# Patient Record
Sex: Female | Born: 1935 | Race: White | Hispanic: No | Marital: Married | Smoking: Former smoker
Health system: Southern US, Community
[De-identification: ages and names within clinical notes are randomized; demographics above are authoritative.]

## PROBLEM LIST (undated history)

## (undated) DIAGNOSIS — R079 Chest pain, unspecified: Secondary | ICD-10-CM

## (undated) DIAGNOSIS — Z87891 Personal history of nicotine dependence: Secondary | ICD-10-CM

## (undated) DIAGNOSIS — R413 Other amnesia: Secondary | ICD-10-CM

## (undated) DIAGNOSIS — H269 Unspecified cataract: Secondary | ICD-10-CM

## (undated) DIAGNOSIS — F419 Anxiety disorder, unspecified: Secondary | ICD-10-CM

## (undated) DIAGNOSIS — I10 Essential (primary) hypertension: Secondary | ICD-10-CM

## (undated) DIAGNOSIS — C801 Malignant (primary) neoplasm, unspecified: Secondary | ICD-10-CM

## (undated) HISTORY — PX: BREAST BIOPSY: SHX20

## (undated) HISTORY — DX: Personal history of nicotine dependence: Z87.891

## (undated) HISTORY — DX: Unspecified cataract: H26.9

## (undated) HISTORY — PX: CHOLECYSTECTOMY: SHX55

## (undated) HISTORY — DX: Anxiety disorder, unspecified: F41.9

## (undated) HISTORY — DX: Other amnesia: R41.3

## (undated) HISTORY — DX: Chest pain, unspecified: R07.9

## (undated) HISTORY — DX: Essential (primary) hypertension: I10

## (undated) HISTORY — PX: MELANOMA EXCISION: SHX5266

## (undated) HISTORY — DX: Malignant (primary) neoplasm, unspecified: C80.1

---

## 1998-12-27 ENCOUNTER — Encounter: Admission: RE | Admit: 1998-12-27 | Discharge: 1998-12-27 | Payer: Self-pay | Admitting: Family Medicine

## 1998-12-27 ENCOUNTER — Encounter: Payer: Self-pay | Admitting: Family Medicine

## 1999-01-01 ENCOUNTER — Other Ambulatory Visit: Admission: RE | Admit: 1999-01-01 | Discharge: 1999-01-01 | Payer: Self-pay | Admitting: Family Medicine

## 1999-01-09 ENCOUNTER — Encounter: Payer: Self-pay | Admitting: Family Medicine

## 1999-01-09 ENCOUNTER — Encounter: Admission: RE | Admit: 1999-01-09 | Discharge: 1999-01-09 | Payer: Self-pay | Admitting: Family Medicine

## 2000-04-02 ENCOUNTER — Encounter: Payer: Self-pay | Admitting: Family Medicine

## 2000-04-02 ENCOUNTER — Encounter: Admission: RE | Admit: 2000-04-02 | Discharge: 2000-04-02 | Payer: Self-pay | Admitting: Family Medicine

## 2000-05-05 ENCOUNTER — Encounter: Payer: Self-pay | Admitting: Family Medicine

## 2000-05-05 ENCOUNTER — Encounter: Admission: RE | Admit: 2000-05-05 | Discharge: 2000-05-05 | Payer: Self-pay | Admitting: *Deleted

## 2000-10-07 ENCOUNTER — Other Ambulatory Visit: Admission: RE | Admit: 2000-10-07 | Discharge: 2000-10-07 | Payer: Self-pay | Admitting: Obstetrics and Gynecology

## 2000-11-10 ENCOUNTER — Encounter: Payer: Self-pay | Admitting: Family Medicine

## 2000-11-10 ENCOUNTER — Ambulatory Visit (HOSPITAL_COMMUNITY): Admission: RE | Admit: 2000-11-10 | Discharge: 2000-11-10 | Payer: Self-pay | Admitting: Family Medicine

## 2001-08-04 ENCOUNTER — Encounter: Payer: Self-pay | Admitting: Family Medicine

## 2001-08-04 ENCOUNTER — Encounter: Admission: RE | Admit: 2001-08-04 | Discharge: 2001-08-04 | Payer: Self-pay | Admitting: Family Medicine

## 2002-11-15 ENCOUNTER — Encounter: Admission: RE | Admit: 2002-11-15 | Discharge: 2002-11-15 | Payer: Self-pay | Admitting: Family Medicine

## 2002-11-15 ENCOUNTER — Encounter: Payer: Self-pay | Admitting: Family Medicine

## 2002-12-23 ENCOUNTER — Encounter: Payer: Self-pay | Admitting: Gastroenterology

## 2003-02-02 ENCOUNTER — Ambulatory Visit (HOSPITAL_COMMUNITY): Admission: RE | Admit: 2003-02-02 | Discharge: 2003-02-02 | Payer: Self-pay | Admitting: Gastroenterology

## 2003-08-07 ENCOUNTER — Ambulatory Visit (HOSPITAL_COMMUNITY): Admission: RE | Admit: 2003-08-07 | Discharge: 2003-08-07 | Payer: Self-pay | Admitting: Family Medicine

## 2004-02-07 ENCOUNTER — Ambulatory Visit (HOSPITAL_COMMUNITY): Admission: RE | Admit: 2004-02-07 | Discharge: 2004-02-07 | Payer: Self-pay | Admitting: Family Medicine

## 2004-04-04 ENCOUNTER — Ambulatory Visit: Payer: Self-pay | Admitting: Gastroenterology

## 2004-07-24 ENCOUNTER — Encounter: Admission: RE | Admit: 2004-07-24 | Discharge: 2004-07-24 | Payer: Self-pay | Admitting: Internal Medicine

## 2004-09-04 ENCOUNTER — Encounter: Admission: RE | Admit: 2004-09-04 | Discharge: 2004-09-04 | Payer: Self-pay | Admitting: Family Medicine

## 2005-01-15 ENCOUNTER — Ambulatory Visit: Payer: Self-pay | Admitting: Gastroenterology

## 2005-03-03 ENCOUNTER — Ambulatory Visit: Payer: Self-pay | Admitting: Gastroenterology

## 2006-03-19 ENCOUNTER — Encounter: Admission: RE | Admit: 2006-03-19 | Discharge: 2006-03-19 | Payer: Self-pay | Admitting: Internal Medicine

## 2007-07-08 ENCOUNTER — Encounter: Admission: RE | Admit: 2007-07-08 | Discharge: 2007-07-08 | Payer: Self-pay | Admitting: Internal Medicine

## 2007-11-05 ENCOUNTER — Encounter: Admission: RE | Admit: 2007-11-05 | Discharge: 2007-11-05 | Payer: Self-pay | Admitting: Internal Medicine

## 2008-11-09 ENCOUNTER — Encounter: Admission: RE | Admit: 2008-11-09 | Discharge: 2008-11-09 | Payer: Self-pay | Admitting: Internal Medicine

## 2008-12-19 DIAGNOSIS — F329 Major depressive disorder, single episode, unspecified: Secondary | ICD-10-CM | POA: Insufficient documentation

## 2008-12-19 DIAGNOSIS — M1990 Unspecified osteoarthritis, unspecified site: Secondary | ICD-10-CM | POA: Insufficient documentation

## 2008-12-19 DIAGNOSIS — G4700 Insomnia, unspecified: Secondary | ICD-10-CM | POA: Insufficient documentation

## 2008-12-19 DIAGNOSIS — E785 Hyperlipidemia, unspecified: Secondary | ICD-10-CM | POA: Insufficient documentation

## 2008-12-19 DIAGNOSIS — C439 Malignant melanoma of skin, unspecified: Secondary | ICD-10-CM | POA: Insufficient documentation

## 2008-12-19 DIAGNOSIS — K449 Diaphragmatic hernia without obstruction or gangrene: Secondary | ICD-10-CM | POA: Insufficient documentation

## 2008-12-20 ENCOUNTER — Encounter: Payer: Self-pay | Admitting: Gastroenterology

## 2008-12-27 ENCOUNTER — Encounter (INDEPENDENT_AMBULATORY_CARE_PROVIDER_SITE_OTHER): Payer: Self-pay | Admitting: *Deleted

## 2008-12-27 DIAGNOSIS — G629 Polyneuropathy, unspecified: Secondary | ICD-10-CM | POA: Insufficient documentation

## 2009-01-25 ENCOUNTER — Ambulatory Visit: Payer: Self-pay | Admitting: Gastroenterology

## 2009-01-25 DIAGNOSIS — R079 Chest pain, unspecified: Secondary | ICD-10-CM | POA: Insufficient documentation

## 2009-01-25 DIAGNOSIS — R1310 Dysphagia, unspecified: Secondary | ICD-10-CM | POA: Insufficient documentation

## 2009-06-26 DIAGNOSIS — L309 Dermatitis, unspecified: Secondary | ICD-10-CM | POA: Insufficient documentation

## 2010-02-24 HISTORY — PX: BREAST BIOPSY: SHX20

## 2010-03-18 ENCOUNTER — Encounter: Payer: Self-pay | Admitting: Internal Medicine

## 2010-03-28 NOTE — Procedures (Signed)
Summary: colonoscopy   Colonoscopy  Procedure date:  03/03/2005  Findings:      Results: Diverticulosis.       Location:  [facility].    Comments:      Repeat colonoscopy in 10 years.   Patient Name: Kristi Montgomery, Kristi Montgomery MRN:  Procedure Procedures: Colorectal cancer screening, average risk CPT: G0121.  Personnel: Endoscopist: Barbette Hair. Arlyce Dice, MD.  Patient Consent: Procedure, Alternatives, Risks and Benefits discussed, consent obtained, from patient.  Indications Symptoms: Abdominal pain / bloating.  History  Current Medications: Patient is not currently taking Coumadin.  Pre-Exam Physical: Performed Mar 03, 2005. Cardio-pulmonary exam, HEENT exam , Abdominal exam, Mental status exam WNL.  Comments: Patient history reviewed/updated, physical performed prior to initiation of sedation? Exam Exam: Extent of exam reached: Cecum, extent intended: Cecum.  The cecum was identified by IC valve. Colon retroflexion performed. ASA Classification: II. Tolerance: good.  Monitoring: Pulse and BP monitoring, Oximetry used. Supplemental O2 given. at 2 Liters.  Colon Prep Used Miralax for colon prep. Prep results: good.  Sedation Meds: Patient assessed and found to be appropriate for moderate (conscious) sedation. Sedation was managed by the Endoscopist. Fentanyl 75 mcg. given IV. Versed 9 mg. given IV.  Findings - DIVERTICULOSIS: Descending Colon to Sigmoid Colon. ICD9: Diverticulosis: 562.10. Comments: Scattered diverticula.  - NORMAL EXAM: Cecum to Descending Colon.  NORMAL EXAM: Cecum.  NORMAL EXAM: Rectum.   Assessment Abnormal examination, see findings above.  Diagnoses: 562.10: Diverticulosis.   Events  Unplanned Interventions: No intervention was required.  Unplanned Events: There were no complications. Plans  Post Exam Instructions: Post sedation instructions given.  Patient Education: Patient given standard instructions for:  Diverticulosis.  Disposition: After procedure patient sent to recovery. After recovery patient sent home.  Scheduling/Referral: Colonoscopy, to Barbette Hair. Arlyce Dice, MD, around Mar 04, 2015.    This report was created from the original endoscopy report, which was reviewed and signed by the above listed endoscopist.    cc. Ellouise Newer

## 2010-03-28 NOTE — Assessment & Plan Note (Signed)
Summary: increased dyspepsia and dysphagia...as.   History of Present Illness Visit Type: new patient  Primary GI MD: Melvia Heaps MD [facility] Primary Provider: Creola Corn, MD  Requesting Provider: n/a Chief Complaint: dysphagia, acid reflux, belching, and bloating  History of Present Illness:   Kristi Montgomery is a pleasant 77 year old white female referred at the request of Dr. Timothy Lasso for evaluation of chest discomfort.  She has been complaining of midline chest pain when she lifts or bends her arms.  Also be elicited by raising her arms.  it is nonexertional.  About a month ago she had a dysphagia.  This has subsided.  She believes that her dysphagia worsens with anxiety.  She has a history of an esophageal stricture which was dilated 3 years ago   GI Review of Systems    Reports acid reflux, belching, bloating, chest pain, dysphagia with solids, heartburn, nausea, and  vomiting.      Denies abdominal pain, dysphagia with liquids, loss of appetite, vomiting blood, weight loss, and  weight gain.      Reports diarrhea.     Denies anal fissure, black tarry stools, change in bowel habit, constipation, diverticulosis, fecal incontinence, heme positive stool, hemorrhoids, irritable bowel syndrome, jaundice, light color stool, liver problems, rectal bleeding, and  rectal pain.    Current Medications (verified): 1)  Verapamil Hcl Cr 180 Mg Cr-Tabs (Verapamil Hcl) .Marland Kitchen.. 1 By Mouth Once Daily 2)  Hydrochlorothiazide 25 Mg Tabs (Hydrochlorothiazide) .Marland Kitchen.. 1 By Mouth Once Daily 3)  Prilosec Otc 20 Mg Tbec (Omeprazole Magnesium) .Marland Kitchen.. 1 By Mouth Once Daily 4)  Neurontin 100 Mg Caps (Gabapentin) .... One Tablet By Mouth Once Daily 5)  Ambien 10 Mg Tabs (Zolpidem Tartrate) .... One Tablet By Mouth Once Daily As Needed 6)  Hyomax-Sr 0.375 Mg Xr12h-Tab (Hyoscyamine Sulfate) .... One Tablet By Mouth Once Daily As Needed 7)  Citracal Plus  Tabs (Multiple Minerals-Vitamins) .... One Tablet By Mouth Once  Daily  Allergies (verified): No Known Drug Allergies  Past History:  Past Medical History: Diverticulosis Esophageal Stricture Anxiety Disorder Anemia Hypertension  Past Surgical History: Cholecystectomy  Family History: No FH of Colon Cancer: Family History of Breast Cancer:Sister  Family History of Pancreatic Cancer:Brother  Family History of Prostate Cancer:Brother  Family History of Diabetes: Sister  Family History of Heart Disease: Father and  Multiple Family Members   Social History: Occupation: Retired Married 2 childern Patient is a former smoker.  Alcohol Use - no Daily Caffeine Use: one daily Illicit Drug Use - no Smoking Status:  quit Drug Use:  no  Review of Systems       The patient complains of change in vision, fatigue, hearing problems, and sleeping problems.  The patient denies allergy/sinus, anemia, anxiety-new, arthritis/joint pain, back pain, blood in urine, breast changes/lumps, confusion, cough, coughing up blood, depression-new, fainting, fever, headaches-new, heart murmur, heart rhythm changes, itching, menstrual pain, muscle pains/cramps, night sweats, nosebleeds, pregnancy symptoms, shortness of breath, skin rash, sore throat, swelling of feet/legs, swollen lymph glands, thirst - excessive , urination - excessive , urination changes/pain, urine leakage, vision changes, and voice change.         All other systems were reviewed and were negative   Vital Signs:  Patient profile:   77 year old female Height:      63 inches Weight:      143 pounds BMI:     25.42 BSA:     1.68 Pulse rate:   74 / minute Pulse  rhythm:   regular BP sitting:   108 / 60  (left arm) Cuff size:   regular  Vitals Entered By: Ok Anis CMA (January 25, 2009 9:58 AM)  Physical Exam  Additional Exam:  She is a well-developed well-nourished female  skin: anicteric HEENT: normocephalic; PEERLA; no nasal or pharyngeal abnormalities neck: supple nodes: no  cervical lymphadenopathy chest: clear to ausculatation and percussion; there is mild tenderness to palpation over the sternum and manubrium heart: no murmurs, gallops, or rubs abd: soft, nontender; BS normoactive; no abdominal masses, tenderness, organomegaly rectal: deferred ext: no cynanosis, clubbing, edema skeletal: no deformities neuro: oriented x 3; no focal abnormalities    Impression & Recommendations:  Problem # 1:  CHEST PAIN UNSPECIFIED (ICD-786.50) Assessment New This appears to be musculoskeletal in origin.  Recommendations #1 NSAIDs as needed.  If his problem continues I instructed her to followup with Dr. Timothy Lasso  Problem # 2:  DYSPHAGIA UNSPECIFIED (ICD-787.20) She probably has an early recurrent esophageal stricture.  She does not wish to undergo dilatation at this time unless her symptoms recur.

## 2010-03-28 NOTE — Letter (Signed)
Summary: New Patient letter  [facility]  [address] [place], [place] [address]   Phone: [phone]  Fax: [phone]       12/27/2008 MRN: [id number]  Kristi Montgomery [address] [place], [place]  [address]  Dear Kristi Montgomery,  Welcome to the Gastroenterology Division at [facility].    You are scheduled to see Dr.  Arlyce Dice on 01/25/2009 at 10:00am on the 3rd floor at [facility], [address]  We ask that you try to arrive at our office 15 minutes prior to your appointment time to allow for check-in.  We would like you to complete the enclosed self-administered evaluation form prior to your visit and bring it with you on the day of your appointment.  We will review it with you.  Also, please bring a complete list of all your medications or, if you prefer, bring the medication bottles and we will list them.  Please bring your insurance card so that we may make a copy of it.  If your insurance requires a referral to see a specialist, please bring your referral form from your primary care physician.  Co-payments are due at the time of your visit and may be paid by cash, check or credit card.     Your office visit will consist of a consult with your physician (includes a physical exam), any laboratory testing he/she may order, scheduling of any necessary diagnostic testing (e.g. x-ray, ultrasound, CT-scan), and scheduling of a procedure (e.g. Endoscopy, Colonoscopy) if required.  Please allow enough time on your schedule to allow for any/all of these possibilities.    If you cannot keep your appointment, please call [phone] to cancel or reschedule prior to your appointment date.  This allows [place] the opportunity to schedule an appointment for another patient in need of care.  If you do not cancel or reschedule by 5 p.m. the business day prior to your appointment date, you will be charged a $50.00 late cancellation/no-show fee.    Thank you for  choosing [facility] for your medical needs.  We appreciate the opportunity to care for you.  Please visit [place] at our website  to learn more about our practice.                     Sincerely,                                                             The Gastroenterology Division

## 2010-03-28 NOTE — Procedures (Signed)
Summary: EGD   EGD  Procedure date:  12/23/2002  Findings:      Findings: Stricture:  Location: [facility]   Patient Name: Kristi Montgomery, Kristi Montgomery MRN:  Procedure Procedures: Panendoscopy (EGD) CPT: 43235.    with biopsy(s)/brushing(s). CPT: D1846139.    with esophageal dilation. CPT: G9296129.  Personnel: Endoscopist: Barbette Hair. Arlyce Dice, MD.  Indications Symptoms: Chest Pain. Abdominal pain,  History  Current Medications: Patient is not currently taking Coumadin.  Pre-Exam Physical: Performed Dec 23, 2002  Entire physical exam was normal.  Exam Exam Info: Maximum depth of insertion Duodenum, intended Duodenum. Vocal cords visualized. Gastric retroflexion performed. ASA Classification: I. Tolerance: good.  Sedation Meds: Robinul 0.2 given IV. Fentanyl 50 mcg. given IV. Versed 5 mg. given IV. Cetacaine Spray 2 sprays given aerosolized.  Monitoring: BP and pulse monitoring done. Oximetry used. Supplemental O2 given at 2 Liters.  Findings STRICTURE / STENOSIS: Stricture in Distal Esophagus.  35 cm from mouth. ICD9: Esophageal Stricture: 530.3.  - Dilation: Distal Esophagus. Maloney dilator used, Diameter: 17 mm, Moderate Resistance, No Heme present on extraction. Outcome: successful.  - BARRETT'S ESOPHAGUS:  suspected. Proximal margin 35 cm from mouth,  Z Line 36 cm from mouth, distal margin 40 cm. Length of Barrett's 5 cm. Inflammation present. Biopsy/Barrett's taken. Comment: r/o Barrett's esophagus.   Assessment Abnormal examination, see findings above.  Diagnoses: 530.3: Esophageal Stricture.   Events  Unplanned Intervention: No unplanned interventions were required.  Unplanned Events: There were no complications. Plans Medication(s): Await pathology. PPI: Pantoprazole/Protonix 40 mg QD, starting Dec 23, 2002   Scheduling: Office Visit, to [employer]. Arlyce Dice, MD, around Jan 23, 2003.    This report was created from the original endoscopy report,  which was reviewed and signed by the above listed endoscopist.    cc. Ellouise Newer

## 2010-04-17 ENCOUNTER — Other Ambulatory Visit: Payer: Self-pay | Admitting: Internal Medicine

## 2010-04-17 DIAGNOSIS — Z1231 Encounter for screening mammogram for malignant neoplasm of breast: Secondary | ICD-10-CM

## 2010-05-01 ENCOUNTER — Ambulatory Visit
Admission: RE | Admit: 2010-05-01 | Discharge: 2010-05-01 | Disposition: A | Discharge status: Home / Self Care | Payer: No Typology Code available for payment source | Source: Ambulatory Visit | Attending: Internal Medicine | Admitting: Internal Medicine

## 2010-05-01 DIAGNOSIS — Z1231 Encounter for screening mammogram for malignant neoplasm of breast: Secondary | ICD-10-CM

## 2010-05-03 ENCOUNTER — Other Ambulatory Visit: Payer: Self-pay | Admitting: Internal Medicine

## 2010-05-03 DIAGNOSIS — R928 Other abnormal and inconclusive findings on diagnostic imaging of breast: Secondary | ICD-10-CM

## 2010-05-08 ENCOUNTER — Ambulatory Visit
Admission: RE | Admit: 2010-05-08 | Discharge: 2010-05-08 | Disposition: A | Discharge status: Home / Self Care | Payer: No Typology Code available for payment source | Source: Ambulatory Visit | Attending: Internal Medicine | Admitting: Internal Medicine

## 2010-05-08 DIAGNOSIS — R928 Other abnormal and inconclusive findings on diagnostic imaging of breast: Secondary | ICD-10-CM

## 2010-10-03 ENCOUNTER — Other Ambulatory Visit: Payer: Self-pay | Admitting: Internal Medicine

## 2010-10-03 DIAGNOSIS — R921 Mammographic calcification found on diagnostic imaging of breast: Secondary | ICD-10-CM

## 2010-11-07 ENCOUNTER — Other Ambulatory Visit: Payer: Self-pay | Admitting: Internal Medicine

## 2010-11-07 ENCOUNTER — Ambulatory Visit
Admission: RE | Admit: 2010-11-07 | Discharge: 2010-11-07 | Disposition: A | Discharge status: Home / Self Care | Payer: No Typology Code available for payment source | Source: Ambulatory Visit | Attending: Internal Medicine | Admitting: Internal Medicine

## 2010-11-07 DIAGNOSIS — R921 Mammographic calcification found on diagnostic imaging of breast: Secondary | ICD-10-CM

## 2010-11-07 DIAGNOSIS — N6320 Unspecified lump in the left breast, unspecified quadrant: Secondary | ICD-10-CM

## 2011-01-15 DIAGNOSIS — H3530 Unspecified macular degeneration: Secondary | ICD-10-CM | POA: Insufficient documentation

## 2011-01-15 DIAGNOSIS — J309 Allergic rhinitis, unspecified: Secondary | ICD-10-CM | POA: Insufficient documentation

## 2011-06-24 ENCOUNTER — Other Ambulatory Visit: Payer: Self-pay | Admitting: Dermatology

## 2011-07-10 DIAGNOSIS — D3130 Benign neoplasm of unspecified choroid: Secondary | ICD-10-CM | POA: Insufficient documentation

## 2011-07-10 DIAGNOSIS — IMO0002 Reserved for concepts with insufficient information to code with codable children: Secondary | ICD-10-CM | POA: Insufficient documentation

## 2011-07-10 DIAGNOSIS — H353190 Nonexudative age-related macular degeneration, unspecified eye, stage unspecified: Secondary | ICD-10-CM | POA: Insufficient documentation

## 2011-07-29 ENCOUNTER — Other Ambulatory Visit: Payer: Self-pay | Admitting: Dermatology

## 2011-09-05 ENCOUNTER — Other Ambulatory Visit: Payer: Self-pay

## 2012-02-27 ENCOUNTER — Other Ambulatory Visit: Payer: Self-pay | Admitting: Internal Medicine

## 2012-02-27 DIAGNOSIS — Z1231 Encounter for screening mammogram for malignant neoplasm of breast: Secondary | ICD-10-CM

## 2012-03-25 ENCOUNTER — Other Ambulatory Visit: Payer: Self-pay | Admitting: Internal Medicine

## 2012-03-25 ENCOUNTER — Ambulatory Visit
Admission: RE | Admit: 2012-03-25 | Discharge: 2012-03-25 | Disposition: A | Discharge status: Home / Self Care | Payer: Medicare Other | Source: Ambulatory Visit | Attending: Internal Medicine | Admitting: Internal Medicine

## 2012-03-25 DIAGNOSIS — Z1231 Encounter for screening mammogram for malignant neoplasm of breast: Secondary | ICD-10-CM

## 2012-03-30 ENCOUNTER — Other Ambulatory Visit (HOSPITAL_COMMUNITY): Payer: Self-pay | Admitting: Cardiovascular Disease

## 2012-03-30 DIAGNOSIS — R079 Chest pain, unspecified: Secondary | ICD-10-CM

## 2012-04-01 ENCOUNTER — Ambulatory Visit (HOSPITAL_COMMUNITY)
Admission: RE | Admit: 2012-04-01 | Discharge: 2012-04-01 | Disposition: A | Discharge status: Home / Self Care | Payer: Medicare Other | Source: Ambulatory Visit | Attending: Cardiovascular Disease | Admitting: Cardiovascular Disease

## 2012-04-01 DIAGNOSIS — R0609 Other forms of dyspnea: Secondary | ICD-10-CM | POA: Insufficient documentation

## 2012-04-01 DIAGNOSIS — I10 Essential (primary) hypertension: Secondary | ICD-10-CM | POA: Insufficient documentation

## 2012-04-01 DIAGNOSIS — R0989 Other specified symptoms and signs involving the circulatory and respiratory systems: Secondary | ICD-10-CM | POA: Insufficient documentation

## 2012-04-01 DIAGNOSIS — R42 Dizziness and giddiness: Secondary | ICD-10-CM | POA: Insufficient documentation

## 2012-04-01 DIAGNOSIS — R079 Chest pain, unspecified: Secondary | ICD-10-CM | POA: Insufficient documentation

## 2012-04-01 MED ORDER — TECHNETIUM TC 99M SESTAMIBI GENERIC - CARDIOLITE
31.0000 | Freq: Once | INTRAVENOUS | Status: AC | PRN
  Administered 2012-04-01: 31 via INTRAVENOUS

## 2012-04-01 MED ORDER — TECHNETIUM TC 99M SESTAMIBI GENERIC - CARDIOLITE
10.8000 | Freq: Once | INTRAVENOUS | Status: AC | PRN
  Administered 2012-04-01: 11 via INTRAVENOUS

## 2012-04-01 MED ORDER — AMINOPHYLLINE 25 MG/ML IV SOLN
75.0000 mg | Freq: Once | INTRAVENOUS | Status: AC
  Administered 2012-04-01: 75 mg via INTRAVENOUS

## 2012-04-01 MED ORDER — REGADENOSON 0.4 MG/5ML IV SOLN
0.4000 mg | Freq: Once | INTRAVENOUS | Status: AC
  Administered 2012-04-01: 0.4 mg via INTRAVENOUS

## 2012-04-01 NOTE — Procedures (Addendum)
[facility]rist[facility] [address] [place] [address] [phone]  Cardiology Nuclear Med Study  Kristi Montgomery is a 79 y.o. female     MRN : [id number]     DOB: 1931/02/06  Procedure Date: 04/01/2012  Nuclear Med Background Indication for Stress Test:  Evaluation for Ischemia History:  No prior cardiac history Cardiac Risk Factors: Family History - CAD, History of Smoking, Hypertension and Lipids  Symptoms:  Chest Pain, Dizziness and DOE   Nuclear Pre-Procedure Caffeine/Decaff Intake:  10:00pm NPO After: 8:00am   IV Site: R Antecubital  IV 0.9% NS with Angio Cath:  22g  Chest Size (in):  n/a IV Started by: Koren Shiver, CNMT  Height: 5\' 3"  (1.6 m)  Cup Size: C  BMI:  Body mass index is 26.04 kg/(m^2). Weight:  147 lb (66.679 kg)   Tech Comments:  n/a    Nuclear Med Study 1 or 2 day study: 1 day  Stress Test Type:  Lexiscan  Order Authorizing Provider:  Nanetta Batty, MD   Resting Radionuclide: Technetium 34m Sestamibi  Resting Radionuclide Dose: 10.8 mCi   Stress Radionuclide:  Technetium 58m Sestamibi  Stress Radionuclide Dose: 31.0 mCi           Stress Protocol Rest HR: 83 Stress HR: 117  Rest BP: 158/82 Stress BP: 188/67  Exercise Time (min): n/a METS: n/a          Dose of Adenosine (mg):  n/a Dose of Lexiscan: 0.4 mg  Dose of Atropine (mg): n/a Dose of Dobutamine: n/a mcg/kg/min (at max HR)  Stress Test Technologist: Ernestene Mention, CCT Nuclear Technologist: Gonzella Lex, CNMT   Rest Procedure:  Myocardial perfusion imaging was performed at rest 45 minutes following the intravenous administration of Technetium 78m Sestamibi. Stress Procedure:  The patient received IV Lexiscan 0.4 mg over 15-seconds.  Technetium 87m Sestamibi injected at 30-seconds.  Due to patient's dizziness, light-headedness and head pain she was given IV Aminophylline 75 mg. Symptoms were resolved. There were no significant changes with  Lexiscan.  Quantitative spect images were obtained after a 45 minute delay.  Transient Ischemic Dilatation (Normal <1.22):  1.07 Lung/Heart Ratio (Normal <0.45):  0.27 QGS EDV:  37 ml QGS ESV:  3 ml LV Ejection Fraction: 91%     Rest ECG: NSR - Normal EKG  Stress ECG: No significant change from baseline ECG and There are scattered PACs.  QPS Raw Data Images:  Normal; no motion artifact; normal heart/lung ratio. Stress Images:  Normal homogeneous uptake in all areas of the myocardium. Rest Images:  Normal homogeneous uptake in all areas of the myocardium. Subtraction (SDS):  No evidence of ischemia. LV Wall Motion:  NL LV Function; NL Wall Motion  Impression Exercise Capacity:  Lexiscan with no exercise. BP Response:  Normal blood pressure response. Clinical Symptoms:  No significant symptoms noted. ECG Impression:  No significant ST segment change suggestive of ischemia. Comparison with Prior Nuclear Study: No previous nuclear study performed  Overall Impression:  Normal stress nuclear study.    Thurmon Fair, MD  04/01/2012 1:24 PM

## 2012-05-26 ENCOUNTER — Other Ambulatory Visit: Payer: Self-pay

## 2012-06-26 ENCOUNTER — Encounter: Payer: Self-pay | Admitting: Cardiovascular Disease

## 2012-07-29 ENCOUNTER — Ambulatory Visit (INDEPENDENT_AMBULATORY_CARE_PROVIDER_SITE_OTHER): Payer: Medicare Other | Admitting: Cardiovascular Disease

## 2012-07-29 ENCOUNTER — Encounter: Payer: Self-pay | Admitting: Cardiovascular Disease

## 2012-07-29 VITALS — BP 110/62 | HR 80 | Ht 64.75 in | Wt 145.0 lb

## 2012-07-29 DIAGNOSIS — R079 Chest pain, unspecified: Secondary | ICD-10-CM

## 2012-07-29 DIAGNOSIS — I10 Essential (primary) hypertension: Secondary | ICD-10-CM

## 2012-07-29 DIAGNOSIS — R0989 Other specified symptoms and signs involving the circulatory and respiratory systems: Secondary | ICD-10-CM

## 2012-07-29 NOTE — Patient Instructions (Signed)
  Your physician wants you to follow-up with him in : 12 months                                                          You will receive a reminder letter in the mail one month in advance. If you don't receive a letter, please call our office to schedule the follow-up appointment.    Your physician has ordered the following tests: carotid dopplers

## 2012-07-29 NOTE — Assessment & Plan Note (Signed)
Under good control on current medications 

## 2012-07-29 NOTE — Assessment & Plan Note (Signed)
Patient had a Myoview stress test 04/05/12 which was completely normal and apparently she is pursuing a GI evaluation.

## 2012-07-29 NOTE — Progress Notes (Signed)
07/29/2012 [facility]   [birth year]  [id number]  Primary Physician Gwen Pounds, MD Primary Cardiologist: Runell Gess MD Roseanne Reno   HPI:  The patient is a 79 year old, thin-appearing, married Caucasian female, mother of 2, grandmother to 1 grandchild who was referred through the courtesy of Dr. Timothy Lasso for cardiovascular evaluation because of fairly recent onset chest pain.   Risk factors include remote tobacco abuse having quit 24 years ago and treated hypertension. She does have a strong family history for heart disease with a mother that died of an MI. Father had an MI at age 56. Four brothers had bypass surgery. Her surgical history is remarkable for a cholecystectomy in the past. She has had chest pain over the last year principally occurring when she uses her upper extremities. She feels a pressure sensation in her chest associated with nausea that gradually dissipates when she stops exerting herself. She had a myocardial perfusion study performed 04/05/12 which was entirely normal. Dr. Timothy Lasso in pursuing a GI evaluation.     Current Outpatient Prescriptions  Medication Sig Dispense Refill  . ALPRAZolam (XANAX) 0.25 MG tablet Take 0.25 mg by mouth as needed for sleep.      Marland Kitchen gabapentin (NEURONTIN) 100 MG capsule Take 100 mg by mouth 3 (three) times daily.      . Multiple Vitamins-Minerals (PRESERVISION AREDS PO) Take 1 tablet by mouth daily.      Marland Kitchen omeprazole (PRILOSEC) 20 MG capsule Take 20 mg by mouth as needed.      . triamterene-hydrochlorothiazide (MAXZIDE-25) 37.5-25 MG per tablet Take 1 tablet by mouth daily.      . verapamil (COVERA HS) 240 MG (CO) 24 hr tablet Take 240 mg by mouth at bedtime.      Marland Kitchen zolpidem (AMBIEN) 10 MG tablet Take 5 mg by mouth at bedtime as needed for sleep.       No current facility-administered medications for this visit.    No Known Allergies  History   Social History  . Marital Status: Married    Spouse Name: N/A   Number of Children: N/A  . Years of Education: N/A   Occupational History  . Not on file.   Social History Main Topics  . Smoking status: Former Smoker    Quit date: 02/25/1988  . Smokeless tobacco: Not on file  . Alcohol Use: No  . Drug Use: Not on file  . Sexually Active: Not on file   Other Topics Concern  . Not on file   Social History Narrative  . No narrative on file     Review of Systems: General: negative for chills, fever, night sweats or weight changes.  Cardiovascular: negative for chest pain, dyspnea on exertion, edema, orthopnea, palpitations, paroxysmal nocturnal dyspnea or shortness of breath Dermatological: negative for rash Respiratory: negative for cough or wheezing Urologic: negative for hematuria Abdominal: negative for nausea, vomiting, diarrhea, bright red blood per rectum, melena, or hematemesis Neurologic: negative for visual changes, syncope, or dizziness All other systems reviewed and are otherwise negative except as noted above.    Blood pressure 110/62, pulse 80, height 5' 4.75" (1.645 m), weight 145 lb (65.772 kg).  General appearance: alert and no distress Neck: There is a soft right carotid bruit  EKG not performed today  ASSESSMENT AND PLAN:   CHEST PAIN UNSPECIFIED Patient had a Myoview stress test 04/05/12 which was completely normal and apparently she is pursuing a GI evaluation.  Essential hypertension Under good control on current  medications      Runell Gess MD FACP,FACC,FAHA, [facility] 07/29/2012 11:41 AM\

## 2012-08-02 ENCOUNTER — Ambulatory Visit (HOSPITAL_COMMUNITY)
Admission: RE | Admit: 2012-08-02 | Discharge: 2012-08-02 | Disposition: A | Discharge status: Home / Self Care | Payer: Medicare Other | Source: Ambulatory Visit | Attending: Cardiovascular Disease | Admitting: Cardiovascular Disease

## 2012-08-02 DIAGNOSIS — R0989 Other specified symptoms and signs involving the circulatory and respiratory systems: Secondary | ICD-10-CM

## 2012-08-02 NOTE — Progress Notes (Signed)
Carotid Duplex Completed. °Kristi Montgomery ° °

## 2012-08-05 NOTE — Progress Notes (Signed)
LMOM with normal results of carotid dopplers

## 2012-09-29 ENCOUNTER — Other Ambulatory Visit: Payer: Self-pay

## 2012-12-30 ENCOUNTER — Other Ambulatory Visit: Payer: Self-pay

## 2013-01-04 ENCOUNTER — Telehealth: Payer: Self-pay | Admitting: *Deleted

## 2013-01-04 NOTE — Telephone Encounter (Signed)
Pt stated that her husband saw on the computer (she didn't know what he was talking about) where she needed to have her carotid doppler done again. I don't see where there is an order in for one. Her last one was June. She asked that if she needed to have this done to call her back. (ONLY if she needs to schedule)

## 2013-01-04 NOTE — Telephone Encounter (Signed)
Per result note of carotid doppler in June 2014, pt to repeat when "clinically indicated."   No call to pt.

## 2013-03-17 ENCOUNTER — Other Ambulatory Visit: Payer: Self-pay

## 2013-04-13 ENCOUNTER — Other Ambulatory Visit: Payer: Self-pay

## 2013-04-13 DIAGNOSIS — Z1231 Encounter for screening mammogram for malignant neoplasm of breast: Secondary | ICD-10-CM

## 2013-05-03 ENCOUNTER — Ambulatory Visit
Admission: RE | Admit: 2013-05-03 | Discharge: 2013-05-03 | Disposition: A | Discharge status: Home / Self Care | Payer: Medicare Other | Source: Ambulatory Visit

## 2013-05-03 DIAGNOSIS — Z1231 Encounter for screening mammogram for malignant neoplasm of breast: Secondary | ICD-10-CM

## 2013-07-19 ENCOUNTER — Ambulatory Visit: Payer: Medicare Other | Admitting: Physical Therapy

## 2013-07-25 ENCOUNTER — Ambulatory Visit: Payer: Medicare Other | Attending: Otolaryngology | Admitting: Rehabilitative and Restorative Service Providers"

## 2013-07-25 DIAGNOSIS — R42 Dizziness and giddiness: Secondary | ICD-10-CM | POA: Diagnosis not present

## 2013-07-25 DIAGNOSIS — G589 Mononeuropathy, unspecified: Secondary | ICD-10-CM | POA: Diagnosis not present

## 2013-07-25 DIAGNOSIS — R5381 Other malaise: Secondary | ICD-10-CM | POA: Diagnosis not present

## 2013-07-25 DIAGNOSIS — R269 Unspecified abnormalities of gait and mobility: Secondary | ICD-10-CM | POA: Diagnosis not present

## 2013-07-25 DIAGNOSIS — R5383 Other fatigue: Secondary | ICD-10-CM

## 2013-07-25 DIAGNOSIS — IMO0001 Reserved for inherently not codable concepts without codable children: Secondary | ICD-10-CM | POA: Diagnosis present

## 2013-08-02 DIAGNOSIS — M25559 Pain in unspecified hip: Secondary | ICD-10-CM | POA: Insufficient documentation

## 2013-08-02 DIAGNOSIS — M6281 Muscle weakness (generalized): Secondary | ICD-10-CM | POA: Insufficient documentation

## 2013-08-02 DIAGNOSIS — R42 Dizziness and giddiness: Secondary | ICD-10-CM | POA: Insufficient documentation

## 2013-08-08 ENCOUNTER — Ambulatory Visit: Payer: Medicare Other | Admitting: Rehabilitative and Restorative Service Providers"

## 2013-08-08 DIAGNOSIS — IMO0001 Reserved for inherently not codable concepts without codable children: Secondary | ICD-10-CM | POA: Diagnosis not present

## 2013-08-10 ENCOUNTER — Ambulatory Visit: Payer: Medicare Other | Admitting: Rehabilitative and Restorative Service Providers"

## 2013-08-10 DIAGNOSIS — IMO0001 Reserved for inherently not codable concepts without codable children: Secondary | ICD-10-CM | POA: Diagnosis not present

## 2013-08-15 ENCOUNTER — Ambulatory Visit: Payer: Medicare Other | Admitting: Rehabilitative and Restorative Service Providers"

## 2013-08-15 DIAGNOSIS — IMO0001 Reserved for inherently not codable concepts without codable children: Secondary | ICD-10-CM | POA: Diagnosis not present

## 2013-08-17 ENCOUNTER — Ambulatory Visit: Payer: Medicare Other | Admitting: Rehabilitative and Restorative Service Providers"

## 2013-08-17 DIAGNOSIS — IMO0001 Reserved for inherently not codable concepts without codable children: Secondary | ICD-10-CM | POA: Diagnosis not present

## 2013-08-22 ENCOUNTER — Encounter: Payer: Medicare Other | Admitting: Rehabilitative and Restorative Service Providers"

## 2013-08-24 ENCOUNTER — Encounter: Payer: Medicare Other | Admitting: Rehabilitative and Restorative Service Providers"

## 2013-09-30 ENCOUNTER — Ambulatory Visit (INDEPENDENT_AMBULATORY_CARE_PROVIDER_SITE_OTHER): Payer: Medicare Other | Admitting: Emergency Medicine

## 2013-09-30 ENCOUNTER — Ambulatory Visit (INDEPENDENT_AMBULATORY_CARE_PROVIDER_SITE_OTHER): Payer: Medicare Other

## 2013-09-30 VITALS — BP 130/80 | HR 77 | Temp 98.4°F | Resp 17 | Ht 63.5 in | Wt 143.0 lb

## 2013-09-30 DIAGNOSIS — M25569 Pain in unspecified knee: Secondary | ICD-10-CM

## 2013-09-30 DIAGNOSIS — M25561 Pain in right knee: Secondary | ICD-10-CM

## 2013-09-30 DIAGNOSIS — S8010XA Contusion of unspecified lower leg, initial encounter: Secondary | ICD-10-CM

## 2013-09-30 DIAGNOSIS — S8011XA Contusion of right lower leg, initial encounter: Secondary | ICD-10-CM

## 2013-09-30 NOTE — Progress Notes (Signed)
   Subjective:    Patient ID: Kristi Montgomery, female    DOB: [birth year], 80 y.o.   MRN: [id number] This chart was scribed for Darlyne Russian, MD by Cathie Hoops, ED Scribe. The patient was seen in Room 8. The patient's care was started at 12:40 PM.   Chief Complaint  Patient presents with  . Leg Pain   Past Medical History  Diagnosis Date  . History of tobacco abuse   . HTN (hypertension)   . Chest pain     myoview 04/01/12-normal  . Cancer   . Cataract    Past Medical History  Diagnosis Date  . History of tobacco abuse   . HTN (hypertension)   . Chest pain     myoview 04/01/12-normal  . Cancer   . Cataract     HPI  HPI Comments: Kristi Montgomery is a 80 y.o. female who presents to the [facility] complaining of new, moderate right lower leg pain onset 7/28. Pt reports pain and redness in her right lower leg. Pt reports she tripped while walking up the stairs and fell down one step. Pt reports she fell and hit her right lower leg.  Pt denies being on blood thinners. Pt denies taking an aspirin a day. Pt denies any knee or ankle pain.    Review of Systems  Constitutional: Negative for fever and chills.  Gastrointestinal: Negative for nausea and vomiting.  Musculoskeletal: Positive for arthralgias (right lower leg).  Skin: Positive for wound (right lower leg).   Objective:   Physical Exam CONSTITUTIONAL: Well developed/well nourished HEAD: Normocephalic/atraumatic EYES: EOMI/PERRL ENMT: Mucous membranes moist NECK: supple no meningeal signs SPINE:entire spine nontender CV: S1/S2 noted, no murmurs/rubs/gallops noted LUNGS: Lungs are clear to auscultation bilaterally, no apparent distress ABDOMEN: soft, nontender, no rebound or guarding GU:no cva tenderness NEURO: Pt is awake/alert, moves all extremitiesx4 EXTREMITIES: pulses normal, full ROM SKIN: warm, color normal PSYCH: no abnormalities of mood noted  Filed Vitals:   09/30/13 1208  BP: 130/80    Pulse: 77  Temp: 98.4 F (36.9 C)  TempSrc: Oral  Resp: 17  Height: 5' 3.5" (1.613 m)  Weight: 143 lb (64.864 kg)  SpO2: 94%   [facility] reading (PRIMARY) by  Dr. Darlyne Russian. There may be a minimal cortical defect of the proximal tibia as seen on the lateral view.  Assessment & Plan:  12:45 PM- Patient informed of current plan for treatment and evaluation and agrees with plan at this time. She'll continue with Ace wrap elevation over-the-counter pain medications x-rays to the radiologist to

## 2013-10-28 ENCOUNTER — Ambulatory Visit (INDEPENDENT_AMBULATORY_CARE_PROVIDER_SITE_OTHER): Payer: Medicare Other | Admitting: Cardiovascular Disease

## 2013-10-28 ENCOUNTER — Encounter: Payer: Self-pay | Admitting: Cardiovascular Disease

## 2013-10-28 VITALS — BP 124/64 | HR 72 | Ht 63.5 in | Wt 146.0 lb

## 2013-10-28 DIAGNOSIS — I10 Essential (primary) hypertension: Secondary | ICD-10-CM

## 2013-10-28 NOTE — Progress Notes (Signed)
10/28/2013 Kristi Montgomery   1930-12-03  [id number]  Primary Physician Precious Reel, MD Primary Cardiologist: Lorretta Harp MD [name]   HPI:  The patient is a 80 year old, thin-appearing, married Caucasian female, mother of 2, grandmother to 1 grandchild who was referred through the courtesy of Dr. Virgina Jock for cardiovascular evaluation because of fairly recent onset chest pain.   Risk factors include remote tobacco abuse having quit 24 years ago and treated hypertension. She does have a strong family history for heart disease with a mother that died of an MI. Father had an MI at age 57. Four brothers had bypass surgery. Her surgical history is remarkable for a cholecystectomy in the past. She had chest pain remotely principally occurring when she uses her upper extremities. She felt a pressure sensation in her chest associated with nausea that gradually dissipates when she stops exerting herself. She had a myocardial perfusion study performed 04/05/12 which was entirely normal. Since I saw her a year ago chest pain has not really been an issue.    Current Outpatient Prescriptions  Medication Sig Dispense Refill  . gabapentin (NEURONTIN) 100 MG capsule Take 100 mg by mouth as needed.       . triamterene-hydrochlorothiazide (MAXZIDE-25) 37.5-25 MG per tablet Take 1 tablet by mouth daily.      . verapamil (COVERA HS) 240 MG (CO) 24 hr tablet Take 240 mg by mouth at bedtime.      Marland Kitchen zolpidem (AMBIEN) 10 MG tablet Take 5 mg by mouth at bedtime as needed for sleep.       No current facility-administered medications for this visit.    Allergies  Allergen Reactions  . Amoxicillin Other (See Comments)    Tongue swelling     History   Social History  . Marital Status: Married    Spouse Name: N/A    Number of Children: N/A  . Years of Education: N/A   Occupational History  . Not on file.   Social History Main Topics  . Smoking status: Former Smoker    Quit date:  02/25/1988  . Smokeless tobacco: Not on file  . Alcohol Use: No  . Drug Use: No  . Sexual Activity: No   Other Topics Concern  . Not on file   Social History Narrative  . No narrative on file     Review of Systems: General: negative for chills, fever, night sweats or weight changes.  Cardiovascular: negative for chest pain, dyspnea on exertion, edema, orthopnea, palpitations, paroxysmal nocturnal dyspnea or shortness of breath Dermatological: negative for rash Respiratory: negative for cough or wheezing Urologic: negative for hematuria Abdominal: negative for nausea, vomiting, diarrhea, bright red blood per rectum, melena, or hematemesis Neurologic: negative for visual changes, syncope, or dizziness All other systems reviewed and are otherwise negative except as noted above.    Blood pressure 124/64, pulse 72, height 5' 3.5" (1.613 m), weight 146 lb (66.225 kg).  General appearance: alert and no distress Neck: no adenopathy, no carotid bruit, no JVD, supple, symmetrical, trachea midline and thyroid not enlarged, symmetric, no tenderness/mass/nodules Lungs: clear to auscultation bilaterally Heart: regular rate and rhythm, S1, S2 normal, no murmur, click, rub or gallop Extremities: extremities normal, atraumatic, no cyanosis or edema  EKG normal sinus rhythm at 72 without ST or T wave changes  ASSESSMENT AND PLAN:   Essential hypertension Under good control on current medications      Lorretta Harp MD [facility], [place], [facility] 10/28/2013 10:26 AM

## 2013-10-28 NOTE — Assessment & Plan Note (Signed)
Under good control on current medications 

## 2013-10-28 NOTE — Patient Instructions (Signed)
Your physician wants you to follow-up in: 1 year with Dr Berry. You will receive a reminder letter in the mail two months in advance. If you don't receive a letter, please call our office to schedule the follow-up appointment.  

## 2014-01-17 ENCOUNTER — Encounter: Payer: Self-pay | Admitting: Neurology

## 2014-01-17 ENCOUNTER — Ambulatory Visit (INDEPENDENT_AMBULATORY_CARE_PROVIDER_SITE_OTHER): Payer: Medicare Other | Admitting: Neurology

## 2014-01-17 VITALS — BP 124/68 | HR 70 | Temp 98.2°F | Resp 18 | Ht 63.0 in | Wt 144.3 lb

## 2014-01-17 DIAGNOSIS — G250 Essential tremor: Secondary | ICD-10-CM

## 2014-01-17 DIAGNOSIS — R531 Weakness: Secondary | ICD-10-CM

## 2014-01-17 DIAGNOSIS — G609 Hereditary and idiopathic neuropathy, unspecified: Secondary | ICD-10-CM

## 2014-01-17 NOTE — Patient Instructions (Signed)
I have no explanation to why you get the generalized weakness.  The unsteadiness can be due to the neuropathy, vision problems and the episodes of weakness.  I would just monitor for now.  Please call with questions or concerns and I will be happy to see you again.

## 2014-01-17 NOTE — Progress Notes (Signed)
NEUROLOGY CONSULTATION NOTE  Kristi Montgomery MRN: [id number] DOB: [birth year]  Referring provider: Dr. Virgina Jock Primary care provider: Dr. Virgina Jock  Reason for consult:  Dizziness.  HISTORY OF PRESENT ILLNESS: Kristi Montgomery is an 80 year old right-handed woman with hypertension, hyperlipidemia, GERD, depression, anxiety, hyperglycemia, osteopenia, macular degeneration, and history of melena on the left arm (removed 12/24/09) and shingles who presents for dizziness.  Note from PCP reviewed.  She reports a longstanding history of "dizziness" for a few years.  However, she really does not describe dizziness.  When she is up and active, she will experience generalized weakness and fatigue.  There is no associated lightheadedness, spinning sensation, nausea or focal numbness or weakness.  She does endorse occasional nausea associated with her GERD.  She also experiences brief episodes of vertigo, lasting seconds, when she wakes up in the morning and moves her head.  She reports history of bilateral  hearing loss and tinnitus in the left ear.  She reportedly saw an ENT who found no abnormalities on exam.  She also reports longstanding history of neuropathy in her feet.  It initially was painful, but now she just notes numbness.  She reports having had a NCV-EMG several years ago, which verified it.  No cause was ever really sought, but she reports no diabetes.  She also has been having discomfort in the left hip when she gets up.    PAST MEDICAL HISTORY: Past Medical History  Diagnosis Date  . History of tobacco abuse   . HTN (hypertension)   . Chest pain     myoview 04/01/12-normal  . Cancer   . Cataract     PAST SURGICAL HISTORY: Past Surgical History  Procedure Laterality Date  . Cholecystectomy      MEDICATIONS: Current Outpatient Prescriptions on File Prior to Visit  Medication Sig Dispense Refill  . gabapentin (NEURONTIN) 100 MG capsule Take 100 mg by mouth as needed.     .  triamterene-hydrochlorothiazide (MAXZIDE-25) 37.5-25 MG per tablet Take 1 tablet by mouth daily.    . verapamil (COVERA HS) 240 MG (CO) 24 hr tablet Take 240 mg by mouth at bedtime.    Marland Kitchen zolpidem (AMBIEN) 10 MG tablet Take 5 mg by mouth at bedtime as needed for sleep.     No current facility-administered medications on file prior to visit.    ALLERGIES: Allergies  Allergen Reactions  . Amoxicillin Other (See Comments)    Tongue swelling     FAMILY HISTORY: Family History  Problem Relation Age of Onset  . Congestive Heart Failure Mother   . Heart failure Mother   . Heart attack Father     age 45  . Heart Problems Brother   . Cancer Brother   . Cancer Brother     pancreatic   . Cancer Sister     breast   . Cancer Sister     breast    SOCIAL HISTORY: History   Social History  . Marital Status: Married    Spouse Name: N/A    Number of Children: N/A  . Years of Education: N/A   Occupational History  . Not on file.   Social History Main Topics  . Smoking status: Former Smoker    Quit date: 02/25/1988  . Smokeless tobacco: Not on file  . Alcohol Use: No  . Drug Use: No  . Sexual Activity: No   Other Topics Concern  . Not on file   Social History Narrative  REVIEW OF SYSTEMS: Constitutional: No fevers, chills, or sweats, no generalized fatigue, change in appetite Eyes: No visual changes, double vision, eye pain Ear, nose and throat: No hearing loss, ear pain, nasal congestion, sore throat Cardiovascular: No chest pain, palpitations Respiratory:  No shortness of breath at rest or with exertion, wheezes GastrointestinaI: No nausea, vomiting, diarrhea, abdominal pain, fecal incontinence Genitourinary:  No dysuria, urinary retention or frequency Musculoskeletal:  No neck pain, back pain Integumentary: No rash, pruritus, skin lesions Neurological: as above Psychiatric: No depression, insomnia, anxiety Endocrine: No palpitations, fatigue, diaphoresis, mood  swings, change in appetite, change in weight, increased thirst Hematologic/Lymphatic:  No anemia, purpura, petechiae. Allergic/Immunologic: no itchy/runny eyes, nasal congestion, recent allergic reactions, rashes  PHYSICAL EXAM: Filed Vitals:   01/17/14 1029  BP: 124/68  Pulse: 70  Temp: 98.2 F (36.8 C)  Resp: 18   General: No acute distress Head:  Normocephalic/atraumatic Eyes:  fundi unremarkable, without vessel changes, exudates, hemorrhages or papilledema. CN III, IV, VI:  full range of motion, no nystagmus, no ptosis Neck: supple, no paraspinal tenderness, full range of motion Back: No paraspinal tenderness Heart: regular rate and rhythm Lungs: Clear to auscultation bilaterally. Vascular: No carotid bruits. Neurological Exam: Mental status: alert and oriented to person, place, and time, recalled 2 of 3 words after 5 minutes, and remote memory intact, fund of knowledge intact, attention and concentration intact, speech fluent and not dysarthric, language intact. Cranial nerves: CN I: not tested CN II: pupils equal, round and reactive to light, visual fields intact, fundi unremarkable, without vessel changes, exudates, hemorrhages or papilledema. CN III, IV, VI:  full range of motion, no nystagmus, no ptosis CN V: facial sensation intact CN VII: upper and lower face symmetric CN VIII: hearing intact CN IX, X: gag intact, uvula midline CN XI: sternocleidomastoid and trapezius muscles intact CN XII: tongue midline Bulk & Tone: normal, no fasciculations. Motor:  5/5 throughout Sensation:  Reduced pinprick sensation bilaterally up to below the knees.  Reduced vibration sensation in the toes. Deep Tendon Reflexes:  2+ throughout except absent in the ankles.  Toes downgoing. Finger to nose testing:  Postural and kinetic tremor but no dysmetria Gait:  Mildly wide-based gait.  Able to turn.  Difficulty with tandem walking. Romberg negative.  IMPRESSION: Diffuse weakness.  She  really does not describe dizziness or vestibulopathy.  Symptoms are vague and I have no neurologic explanation for these symptoms Essential tremor, mild.  Not disruptive. Gait instability.  Multifactorial.  Contributing factors include neuropathy and macular degeneration Idiopathic peripheral neuropathy.  Longstanding history.  Stable  PLAN: I have no recommendations for neurologic testing regarding her presenting concern.  It is vague and she does not really describe dizziness.  The tremor is mild enough that she does not wish to start medication.  She may follow up as needed and call with any questions or concerns.  45 minutes spent with patient, over 50% spent discussing diagnoses and management options.  Thank you for allowing me to take part in the care of this patient.  Metta Clines, DO  CC: Shon Baton, MD

## 2014-01-18 NOTE — Progress Notes (Signed)
Note faxed.

## 2014-02-02 DIAGNOSIS — N182 Chronic kidney disease, stage 2 (mild): Secondary | ICD-10-CM | POA: Insufficient documentation

## 2014-06-06 ENCOUNTER — Other Ambulatory Visit: Payer: Self-pay

## 2014-06-06 DIAGNOSIS — Z1231 Encounter for screening mammogram for malignant neoplasm of breast: Secondary | ICD-10-CM

## 2014-07-04 ENCOUNTER — Ambulatory Visit: Payer: Self-pay

## 2014-07-05 ENCOUNTER — Ambulatory Visit
Admission: RE | Admit: 2014-07-05 | Discharge: 2014-07-05 | Disposition: A | Discharge status: Home / Self Care | Payer: Self-pay | Source: Ambulatory Visit

## 2014-07-05 DIAGNOSIS — Z1231 Encounter for screening mammogram for malignant neoplasm of breast: Secondary | ICD-10-CM

## 2014-07-06 ENCOUNTER — Other Ambulatory Visit: Payer: Self-pay | Admitting: Internal Medicine

## 2014-07-06 DIAGNOSIS — N63 Unspecified lump in unspecified breast: Secondary | ICD-10-CM

## 2014-07-13 ENCOUNTER — Ambulatory Visit
Admission: RE | Admit: 2014-07-13 | Discharge: 2014-07-13 | Disposition: A | Discharge status: Home / Self Care | Payer: Medicare PPO | Source: Ambulatory Visit | Attending: Internal Medicine | Admitting: Internal Medicine

## 2014-07-13 ENCOUNTER — Other Ambulatory Visit: Payer: Self-pay

## 2014-07-13 DIAGNOSIS — H04123 Dry eye syndrome of bilateral lacrimal glands: Secondary | ICD-10-CM | POA: Insufficient documentation

## 2014-07-13 DIAGNOSIS — N63 Unspecified lump in unspecified breast: Secondary | ICD-10-CM

## 2014-08-21 ENCOUNTER — Other Ambulatory Visit: Payer: Self-pay

## 2015-03-12 DIAGNOSIS — H539 Unspecified visual disturbance: Secondary | ICD-10-CM | POA: Insufficient documentation

## 2015-04-20 ENCOUNTER — Encounter: Payer: Self-pay | Admitting: Gastroenterology

## 2015-08-13 ENCOUNTER — Other Ambulatory Visit: Payer: Self-pay | Admitting: Internal Medicine

## 2015-08-13 DIAGNOSIS — Z1231 Encounter for screening mammogram for malignant neoplasm of breast: Secondary | ICD-10-CM

## 2015-08-15 ENCOUNTER — Ambulatory Visit: Payer: Medicare PPO

## 2015-09-04 ENCOUNTER — Ambulatory Visit
Admission: RE | Admit: 2015-09-04 | Discharge: 2015-09-04 | Disposition: A | Discharge status: Home / Self Care | Payer: Medicare PPO | Source: Ambulatory Visit | Attending: Internal Medicine | Admitting: Internal Medicine

## 2015-09-04 DIAGNOSIS — Z1231 Encounter for screening mammogram for malignant neoplasm of breast: Secondary | ICD-10-CM

## 2016-09-02 ENCOUNTER — Other Ambulatory Visit: Payer: Self-pay | Admitting: Internal Medicine

## 2016-09-02 DIAGNOSIS — Z1231 Encounter for screening mammogram for malignant neoplasm of breast: Secondary | ICD-10-CM

## 2016-09-17 ENCOUNTER — Ambulatory Visit
Admission: RE | Admit: 2016-09-17 | Discharge: 2016-09-17 | Disposition: A | Discharge status: Home / Self Care | Payer: Medicare PPO | Source: Ambulatory Visit | Attending: Internal Medicine | Admitting: Internal Medicine

## 2016-09-17 DIAGNOSIS — Z1231 Encounter for screening mammogram for malignant neoplasm of breast: Secondary | ICD-10-CM

## 2017-06-01 ENCOUNTER — Telehealth: Payer: Self-pay | Admitting: Neurology

## 2017-06-01 ENCOUNTER — Ambulatory Visit: Payer: Medicare PPO | Admitting: Neurology

## 2017-06-01 NOTE — Telephone Encounter (Signed)
This patient did not show for a new patient appointment today. 

## 2017-06-02 ENCOUNTER — Encounter: Payer: Self-pay | Admitting: Neurology

## 2017-08-06 ENCOUNTER — Encounter: Payer: Self-pay | Admitting: Neurology

## 2017-08-06 ENCOUNTER — Ambulatory Visit: Payer: Medicare PPO | Admitting: Neurology

## 2017-08-06 ENCOUNTER — Telehealth: Payer: Self-pay | Admitting: Neurology

## 2017-08-06 ENCOUNTER — Other Ambulatory Visit: Payer: Self-pay

## 2017-08-06 VITALS — BP 135/68 | HR 73 | Ht 63.0 in | Wt 139.0 lb

## 2017-08-06 DIAGNOSIS — E538 Deficiency of other specified B group vitamins: Secondary | ICD-10-CM | POA: Diagnosis not present

## 2017-08-06 DIAGNOSIS — R413 Other amnesia: Secondary | ICD-10-CM | POA: Diagnosis not present

## 2017-08-06 HISTORY — DX: Other amnesia: R41.3

## 2017-08-06 NOTE — Telephone Encounter (Signed)
[name] [name]: [id number] (exp. 08/06/17 to 09/05/17) order sent to GI. They will reach out to the pt to schedule.

## 2017-08-06 NOTE — Progress Notes (Signed)
Reason for visit: Memory disturbance  Referring physician: Dr. Alesia Morin Kristi Montgomery is a 84 y.o. female  History of present illness:  Kristi Montgomery is an 84 year old right-handed white female with a history of a mild memory disturbance that she believes has been present over the last 2 years.  The patient has difficulty with remembering names for people and taking in new information.  She will go into her room and cannot member why she went there.  He has difficulty focusing on things.  She is able to manage her appointments and medications, she still does the finances but she has trouble balancing the checkbook.  She does operate a motor vehicle but she may get turned around or lost at times.  The patient reports that there has been a parallel of fatigue along with the memory, she will go to bed around 11 PM and then it may take an hour or 2 to go to sleep, she may wake up several times during the night after she gets to sleep.  The patient feels sleepy and tired during the day, she may take a nap.  She has a history of a peripheral neuropathy, she has some pain and numbness in the feet, she does have some mild balance problems but she has not had any falls.  She denies issues controlling the bowels or the bladder.  She does have some occasional headache without dizziness.  She reports that her younger sister may also be having some memory problems also.  She comes to this office for further evaluation.  She has noted some increased problems with generating speech and grammar, and has had difficulty with handwriting.  She is blinking quite a bit, since her cataract surgery she has noted some difficulty with focusing her eyes.  Past Medical History:  Diagnosis Date  . Anxiety   . Cancer ([place])   . Cataract   . Chest pain    myoview 04/01/12-normal  . History of tobacco abuse   . HTN (hypertension)   . 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/06/2017    Past Surgical History:  Procedure Laterality Date  .  CHOLECYSTECTOMY    . MELANOMA EXCISION     x5    Family History  Problem Relation Age of Onset  . Congestive Heart Failure Mother   . Heart failure Mother   . Heart attack Father        age 82  . Heart Problems Brother   . Cancer Brother   . Cancer Brother        pancreatic   . Cancer Sister        breast   . Cancer Sister        breast  . Breast cancer Neg Hx     Social history:  reports that she quit smoking about 29 years ago. She has never used smokeless tobacco. She reports that she does not drink alcohol or use drugs.  Medications:  Prior to Admission medications   Medication Sig Start Date End Date Taking? Authorizing Provider  ALPRAZolam Duanne Moron) 0.25 MG tablet Take 0.25 mg by mouth as needed for anxiety.   Yes [provider]  cholecalciferol (VITAMIN D) 1000 UNITS tablet Take 1,000 Units by mouth daily.   Yes [provider]  Multiple Vitamins-Minerals (PRESERVISION AREDS 2 PO) Take 1 Dose by mouth daily.   Yes [provider]  Polyethyl Glycol-Propyl Glycol (SYSTANE OP) Apply 1 Dose to eye as needed.   Yes [provider]  triamterene-hydrochlorothiazide (MAXZIDE-25) 37.5-25 MG per tablet Take 1 tablet by mouth daily.   Yes [provider]  verapamil (COVERA HS) 240 MG (CO) 24 hr tablet Take 240 mg by mouth at bedtime.   Yes [provider]      Allergies  Allergen Reactions  . Amoxicillin Other (See Comments)    Tongue swelling   . Gabapentin     Sever constipation    ROS:  Out of a complete 14 system review of symptoms, the patient complains only of the following symptoms, and all other reviewed systems are negative.  Fatigue Hearing loss, ringing in ears, difficulty swallowing Blurred vision, loss of vision, eye pain Constipation Joint swelling Allergies, runny nose Memory loss, confusion, headache, numbness, weakness, difficulty swallowing, dizziness Insomnia  Blood pressure 135/68, pulse 73,  height 5\' 3"  (1.6 m), weight 139 lb (63 kg).  Physical Exam  General: The patient is alert and cooperative at the time of the examination.  Eyes: Pupils are equal, round, and reactive to light. Discs are flat bilaterally.  Neck: The neck is supple, no carotid bruits are noted.  Respiratory: The respiratory examination is clear.  Cardiovascular: The cardiovascular examination reveals a regular rate and rhythm, no obvious murmurs or rubs are noted.  Skin: Extremities are without significant edema.  Neurologic Exam  Mental status: The patient is alert and oriented x 3 at the time of the examination. The patient has apparent normal recent and remote memory, with an apparently normal attention span and concentration ability.  Mini-Mental status examination done today shows a total score 30/30.  Cranial nerves: Facial symmetry is present. There is good sensation of the face to pinprick and soft touch bilaterally. The strength of the facial muscles and the muscles to head turning and shoulder shrug are normal bilaterally. Speech is well enunciated, no aphasia or dysarthria is noted. Extraocular movements are full. Visual fields are full. The tongue is midline, and the patient has symmetric elevation of the soft palate. No obvious hearing deficits are noted.  The patient blinks frequently.  Motor: The motor testing reveals 5 over 5 strength of all 4 extremities. Good symmetric motor tone is noted throughout.  Sensory: Sensory testing is intact to pinprick, soft touch, vibration sensation, and position sense on the upper extremities.  With the lower extremities, there is a stocking pattern pinprick sensory deficit to just below the knees bilaterally, some decrease in vibration and position sense in both feet is seen.  No evidence of extinction is noted.  Coordination: Cerebellar testing reveals good finger-nose-finger and heel-to-shin bilaterally.  Gait and station: Gait is normal. Tandem gait is  slightly unsteady. Romberg is negative. No drift is seen.  Reflexes: Deep tendon reflexes are symmetric, but are somewhat depressed bilaterally. Toes are downgoing bilaterally.   Assessment/Plan:  1.  Reported memory disturbance  2.  Chronic fatigue, excessive daytime drowsiness  3.  Peripheral neuropathy  The patient was sent for blood work today.  She will have a CT scan of the brain, she does not think she can handle MRI evaluation.  We have discussed the possibility of a sleep evaluation, if she is amenable to this in the future we will get this set up.  The patient has noted a parallel between her fatigue and daytime drowsiness and the memory problem.  We may potentially consider medications for memory in the future.  She will follow-up in 6 months.  Jill Alexanders MD 08/06/2017 2:27 PM  [facility] [address] [place], [place] [address]  Phone [phone] Fax [phone]

## 2017-08-06 NOTE — Patient Instructions (Signed)
We will get a CT of the brain.

## 2017-08-07 LAB — SEDIMENTATION RATE: Sed Rate: 9 mm/hr (ref 0–40)

## 2017-08-07 LAB — RPR: RPR Ser Ql: NONREACTIVE

## 2017-08-07 LAB — VITAMIN B12: Vitamin B-12: 761 pg/mL (ref 232–1245)

## 2017-08-19 ENCOUNTER — Other Ambulatory Visit: Payer: Self-pay | Admitting: Internal Medicine

## 2017-08-19 ENCOUNTER — Telehealth: Payer: Self-pay | Admitting: Neurology

## 2017-08-19 DIAGNOSIS — Z1231 Encounter for screening mammogram for malignant neoplasm of breast: Secondary | ICD-10-CM

## 2017-08-19 NOTE — Telephone Encounter (Signed)
I called the patient.  The patient has seen an ophthalmologist in the [facility] area for Botox for blepharospasm.  I indicated that I have never treated anyone for this with Botox before.  I will check to see if Dr. Krista Blue from our office can do this procedure.  I discussed this with Dr. Krista Blue, she indicates that she has treated blood per spasm in the past, she is willing to do this.  I will try to get this set up.  She claims that she uses 100 units of Botox for procedure.

## 2017-08-19 NOTE — Telephone Encounter (Signed)
Patient called and requested to get set up for Botox. She stated that she is wanting to know if Dr. Jannifer Franklin can treat her for excessive blinking. I advised her that I didn't see anything in the note regarding this and she said that it was not discussed at her last visit but she has had it before. Please call and advise.

## 2017-08-20 NOTE — Telephone Encounter (Signed)
If she needs a consult for Botox approval, Dr. Krista Blue will be glad to see her first.  You can offer her the 11am research slot on 08/31/17 or 09/14/17.

## 2017-08-20 NOTE — Telephone Encounter (Signed)
Kristi Montgomery, patient has no clinical documentation of Blepharospasm, she will need a consult apt first, should I offer her first available New Patient?

## 2017-08-20 NOTE — Telephone Encounter (Signed)
I called and offered the patient July 22nd at 11:00, Sharyn Lull would please schedule this?

## 2017-09-07 ENCOUNTER — Ambulatory Visit
Admission: RE | Admit: 2017-09-07 | Discharge: 2017-09-07 | Disposition: A | Discharge status: Home / Self Care | Payer: Medicare PPO | Source: Ambulatory Visit | Attending: Neurology | Admitting: Neurology

## 2017-09-07 DIAGNOSIS — R413 Other amnesia: Secondary | ICD-10-CM

## 2017-09-08 ENCOUNTER — Telehealth: Payer: Self-pay | Admitting: *Deleted

## 2017-09-08 NOTE — Telephone Encounter (Signed)
Per Dr. Felecia Shelling- finding on CT report is not correlated with left ear pain. The finding of "heavily calcified also means atherosclerotic changes (plaque buildup in the arteries). She should continue BP/cholesterol management with PCP.

## 2017-09-08 NOTE — Telephone Encounter (Signed)
Husband called back. He read report on mychart and concerned about the following finding on CT head report:"The dominant left vertebral artery is heavily calcified" He wants to know if the pain she is having in left ear and above left ear is correlated with this finding. Her ear in tender and hurts more when she is eating. She has not started any new meds and denies any injuries. Advised Dr. Jannifer Franklin out of the office and I will speak w/ the WID to address this. I will call back latest tomorrow. He verbalized understanding.

## 2017-09-08 NOTE — Telephone Encounter (Signed)
Called, LVM for pt about normal CT head. Gave [facility] phone number if she has further questions/concerns.

## 2017-09-08 NOTE — Telephone Encounter (Signed)
-----   Message from Penni Bombard, MD sent at 09/08/2017 10:27 AM EDT ----- Normal brain. Please call patient. Continue current plan. -VRP

## 2017-09-08 NOTE — Telephone Encounter (Signed)
Called, LVM for husband to call back.

## 2017-09-09 NOTE — Telephone Encounter (Signed)
Husband called back. I relayed Dr. Garth Bigness message. They will follow back up with PCP about ear pain.

## 2017-09-14 ENCOUNTER — Telehealth: Payer: Self-pay | Admitting: Neurology

## 2017-09-14 ENCOUNTER — Ambulatory Visit: Payer: Medicare PPO | Admitting: Neurology

## 2017-09-14 ENCOUNTER — Encounter: Payer: Self-pay | Admitting: Neurology

## 2017-09-14 VITALS — BP 143/76 | HR 88 | Ht 63.0 in | Wt 135.0 lb

## 2017-09-14 DIAGNOSIS — G244 Idiopathic orofacial dystonia: Secondary | ICD-10-CM | POA: Diagnosis not present

## 2017-09-14 NOTE — Telephone Encounter (Signed)
1 month Xeomin inj

## 2017-09-14 NOTE — Progress Notes (Signed)
PATIENT: Kristi Montgomery DOB: [birth year]  Chief Complaint  Patient presents with  . Blepharospasm    She is here to discuss treatment with Botox.     HISTORICAL  Kristi Montgomery is a 84 year old female, seen in request by my colleague Dr. Jannifer Franklin for evaluation of EMG guided botulism toxin injection for blepharospasm, initial evaluation was on September 14, 2017.  She had a history of memory loss, Mini-Mental Status Examination is 30/30 at most recent office visit on August 06, 2017, peripheral neuropathy, mild balance issues, melanoma in her legs.  I was able to see her most recent office visit by Dr. Deboraha Sprang on May 26, 2017, she was initially evaluated in 2017 by Dr. Deboraha Sprang with diagnosis of Meige syndrome, she had cataract surgery in December 2016, shortly afterwards, she developed frequent eye blinking, sometimes she has to use her fingers to open her eyelid to watch TV, this has significant impact on the quality of his life, she has to stop driving, playing bridges, getting worse when she is trying to focus, will be anxious, there was also abnormal facial movement.     She had her first Botox injection on January 31, 2016, at Dr. Liane Comber office, unfortunately, she developed droopy eyelid.  Use the Botox a 15 units,  She had her second Botox injection by Dr. Kristeen Miss at [facility] in 2017, shortly afterwards, she developed left extraocular eye muscle weakness, strabismus, eventually disappeared in 2 months.  She wants to continue EMG guided botulism toxin injection for her frequent eye blinking, mouth movement, which has caused significant social embarrassment, limitation in her daily function.  REVIEW OF SYSTEMS: Full 14 system review of systems performed and notable only for confusion, dizziness, decreased energy, hearing loss, ringing in ears, fatigue  ALLERGIES: Allergies  Allergen Reactions  . Amoxicillin Other (See Comments)    Tongue swelling   . Codeine Other (See Comments)    . Gabapentin     Sever constipation  . Latex Rash    band aids, tape    HOME MEDICATIONS: Current Outpatient Medications  Medication Sig Dispense Refill  . ALPRAZolam (XANAX) 0.25 MG tablet Take 0.25 mg by mouth as needed for anxiety.    . cholecalciferol (VITAMIN D) 1000 UNITS tablet Take 1,000 Units by mouth daily.    . Multiple Vitamins-Minerals (PRESERVISION AREDS 2 PO) Take 1 Dose by mouth daily.    Kristi Montgomery Glycol-Propyl Glycol (SYSTANE OP) Apply 1 Dose to eye as needed.    . triamterene-hydrochlorothiazide (MAXZIDE-25) 37.5-25 MG per tablet Take 1 tablet by mouth daily.    . verapamil (COVERA HS) 240 MG (CO) 24 hr tablet Take 240 mg by mouth at bedtime.    Marland Kitchen zolpidem (AMBIEN) 10 MG tablet Take 0.5 tablet at bedtime as needed for sleep.     No current facility-administered medications for this visit.     PAST MEDICAL HISTORY: Past Medical History:  Diagnosis Date  . Anxiety   . Cancer ([place])   . Cataract   . Chest pain    myoview 04/01/12-normal  . History of tobacco abuse   . HTN (hypertension)   . 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/06/2017    PAST SURGICAL HISTORY: Past Surgical History:  Procedure Laterality Date  . CHOLECYSTECTOMY    . MELANOMA EXCISION     x5    FAMILY HISTORY: Family History  Problem Relation Age of Onset  . Congestive Heart Failure Mother   . Heart failure Mother   . Heart attack  Father        age 94  . Heart Problems Brother   . Cancer Brother   . Cancer Brother        pancreatic   . Cancer Sister        breast   . Cancer Sister        breast  . Breast cancer Neg Hx     SOCIAL HISTORY: Social History   Socioeconomic History  . Marital status: Married    Spouse name: Marchia Bond  . Number of children: 2  . Years of education: 11  . Highest education level: Not on file  Occupational History  . Occupation: Retired  Scientific laboratory technician  . Financial resource strain: Not on file  . Food insecurity:    Worry: Not on file    Inability: Not on file   . Transportation needs:    Medical: Not on file    Non-medical: Not on file  Tobacco Use  . Smoking status: Former Smoker    Last attempt to quit: 02/25/1988    Years since quitting: 29.5  . Smokeless tobacco: Never Used  Substance and Sexual Activity  . Alcohol use: No  . Drug use: No  . Sexual activity: Never    Birth control/protection: Abstinence  Lifestyle  . Physical activity:    Days per week: Not on file    Minutes per session: Not on file  . Stress: Not on file  Relationships  . Social connections:    Talks on phone: Not on file    Gets together: Not on file    Attends religious service: Not on file    Active member of club or organization: Not on file    Attends meetings of clubs or organizations: Not on file    Relationship status: Not on file  . Intimate partner violence:    Fear of current or ex partner: Not on file    Emotionally abused: Not on file    Physically abused: Not on file    Forced sexual activity: Not on file  Other Topics Concern  . Not on file  Social History Narrative   Lives w/ husband and daughter   Caffeine use: 1 cup per day   Right handed      PHYSICAL EXAM   Vitals:   09/14/17 1042  BP: (!) 143/76  Pulse: 88  Weight: 135 lb (61.2 kg)  Height: 5\' 3"  (1.6 m)    Not recorded      Body mass index is 23.91 kg/m.  PHYSICAL EXAMNIATION:  Gen: NAD, conversant, well nourised, obese, well groomed                     Cardiovascular: Regular rate rhythm, no peripheral edema, warm, nontender. Eyes: Conjunctivae clear without exudates or hemorrhage Neck: Supple, no carotid bruits. Pulmonary: Clear to auscultation bilaterally   NEUROLOGICAL EXAM:  MENTAL STATUS: Speech:    Speech is normal; fluent and spontaneous with normal comprehension.  Cognition:     Orientation to time, place and person     Normal recent and remote memory     Normal Attention span and concentration     Normal Language, naming, repeating,spontaneous  speech     Fund of knowledge   CRANIAL NERVES: CN II: Visual fields are full to confrontation. Fundoscopic exam is normal with sharp discs and no vascular changes. Pupils are round equal and briskly reactive to light. CN III, IV, VI: extraocular movement are  normal. No ptosis. CN V: Facial sensation is intact to pinprick in all 3 divisions bilaterally. Corneal responses are intact.  CN VII: Face is symmetric with normal eye closure and smile. CN VIII: Hearing is normal to rubbing fingers CN IX, X: Palate elevates symmetrically. Phonation is normal. CN XI: Head turning and shoulder shrug are intact CN XII: Tongue is midline with normal movements and no atrophy.  MOTOR: She has no muscle weakness, normal limb muscle tone, frequent yes yes head titubation, frequent grimace movement of her face, frequent eye blinking, posturing of oral facial movement, mild left shoulder elevation, mild right tilt,  REFLEXES: Reflexes are 2+ and symmetric at the biceps, triceps, knees, and ankles. Plantar responses are flexor.  SENSORY: Intact to light touch, pinprick, positional sensation and vibratory sensation are intact in fingers and toes.  COORDINATION: Rapid alternating movements and fine finger movements are intact. There is no dysmetria on finger-to-nose and heel-knee-shin.    GAIT/STANCE: Posture is normal. Gait is steady with normal steps, base, arm swing, and turning. Heel and toe walking are normal. Tandem gait is normal.  Romberg is absent.   DIAGNOSTIC DATA (LABS, IMAGING, TESTING) - I reviewed patient records, labs, notes, testing and imaging myself where available.   ASSESSMENT AND PLAN  ROLINDA IMPSON is a 84 y.o. female   Meige Syndrome with blepharospasm  She would benefit EMG guided botulism toxin injection, ask for xeomin 50 units, will return to clinic in 3 to 4 weeks for reinjection    Marcial Pacas, M.D. Ph.D.  [facility] [address], [place], [place] [address] Ph: [phone] Fax: [phone]  CC: Referring Provider

## 2017-09-15 ENCOUNTER — Encounter: Payer: Self-pay | Admitting: *Deleted

## 2017-09-15 DIAGNOSIS — G245 Blepharospasm: Secondary | ICD-10-CM | POA: Insufficient documentation

## 2017-09-15 NOTE — Telephone Encounter (Signed)
I called and scheduled the patient, Kristi Lull, do you have a new patient form?

## 2017-09-15 NOTE — Telephone Encounter (Signed)
Provided new patient packet to [facility].

## 2017-09-24 DIAGNOSIS — M316 Other giant cell arteritis: Secondary | ICD-10-CM | POA: Insufficient documentation

## 2017-09-24 DIAGNOSIS — M26623 Arthralgia of bilateral temporomandibular joint: Secondary | ICD-10-CM | POA: Insufficient documentation

## 2017-09-24 DIAGNOSIS — H9113 Presbycusis, bilateral: Secondary | ICD-10-CM | POA: Insufficient documentation

## 2017-09-29 ENCOUNTER — Other Ambulatory Visit: Payer: Medicare PPO

## 2017-09-29 ENCOUNTER — Ambulatory Visit: Payer: Medicare PPO

## 2017-10-16 ENCOUNTER — Encounter: Payer: Self-pay | Admitting: Neurology

## 2017-10-16 ENCOUNTER — Ambulatory Visit: Payer: Medicare PPO | Admitting: Neurology

## 2017-10-16 VITALS — BP 172/71 | HR 73 | Ht 63.0 in | Wt 132.0 lb

## 2017-10-16 DIAGNOSIS — I679 Cerebrovascular disease, unspecified: Secondary | ICD-10-CM

## 2017-10-16 DIAGNOSIS — Z5181 Encounter for therapeutic drug level monitoring: Secondary | ICD-10-CM

## 2017-10-16 NOTE — Progress Notes (Signed)
Reason for visit: Cerebrovascular disease  Kristi Montgomery is a 84 y.o. female  History of present illness:  Kristi Montgomery is an 84 year old right-handed white female with a history of a memory disorder, she also has blepharospasm and is seeking treatment with Botox through our office.  The patient has not yet had her treatment.  The patient has been under a lot of stress recently has 2 of her sisters have passed away.  She indicates that her brother has cerebrovascular disease as well and had a carotid blockage associated with the stroke.  The patient has had some problems with balance, she denies any falls.  She has numbness in the feet.  She does report some dizziness.  She denies any significant issues with speech or swallowing.  She has not had any double vision.  She underwent a CT scan of the brain as a work-up for the memory problem and there appear to be significant calcification of the left vertebral artery intracranially, this appeared to be the dominant vessel.  The patient currently is sent back to this office through Dr. Polly Cobia for consultation and evaluation of the cerebrovascular disease.  Past Medical History:  Diagnosis Date  . Anxiety   . Cancer ([place])   . Cataract   . Chest pain    myoview 04/01/12-normal  . History of tobacco abuse   . HTN (hypertension)   . 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/06/2017    Past Surgical History:  Procedure Laterality Date  . CHOLECYSTECTOMY    . MELANOMA EXCISION     x5    Family History  Problem Relation Age of Onset  . Congestive Heart Failure Mother   . Heart failure Mother   . Heart attack Father        age 34  . Heart Problems Brother   . Cancer Brother   . Cancer Brother        pancreatic   . Cancer Sister        breast   . Cancer Sister        breast  . Breast cancer Neg Hx     Social history:  reports that she quit smoking about 29 years ago. She has never used smokeless tobacco. She reports that she does not drink alcohol or  use drugs.  Medications:  Prior to Admission medications   Medication Sig Start Date End Date Taking? Authorizing Provider  ALPRAZolam Duanne Moron) 0.25 MG tablet Take 0.25 mg by mouth as needed for anxiety.   Yes [provider]  cholecalciferol (VITAMIN D) 1000 UNITS tablet Take 1,000 Units by mouth daily.   Yes [provider]  Multiple Vitamins-Minerals (PRESERVISION AREDS 2 PO) Take 1 Dose by mouth daily.   Yes [provider]  Polyethyl Glycol-Propyl Glycol (SYSTANE OP) Apply 1 Dose to eye as needed.   Yes [provider]  triamterene-hydrochlorothiazide (MAXZIDE-25) 37.5-25 MG per tablet Take 1 tablet by mouth daily.   Yes [provider]  verapamil (COVERA HS) 240 MG (CO) 24 hr tablet Take 240 mg by mouth at bedtime.   Yes [provider]  zolpidem (AMBIEN) 10 MG tablet Take 0.5 tablet at bedtime as needed for sleep.   Yes [provider]      Allergies  Allergen Reactions  . Amoxicillin Other (See Comments)    Tongue swelling   . Codeine Other (See Comments)  . Gabapentin     Sever constipation  . Latex Rash    band aids,  tape    ROS:  Out of a complete 14 system review of symptoms, the patient complains only of the following symptoms, and all other reviewed systems are negative.  Fatigue Hearing loss Blurred vision, loss of vision  Blood pressure (!) 172/71, pulse 73, height 5\' 3"  (1.6 m), weight 132 lb (59.9 kg).  Physical Exam  General: The patient is alert and cooperative at the time of the examination.  Eyes: Pupils are equal, round, and reactive to light. Discs are flat bilaterally.  Neck: The neck is supple, no carotid bruits are noted.  Respiratory: The respiratory examination is clear.  Cardiovascular: The cardiovascular examination reveals a regular rate and rhythm, no obvious murmurs or rubs are noted.  Skin: Extremities are without significant edema.  Neurologic Exam  Mental status: The  patient is alert and oriented x 3 at the time of the examination. The patient has apparent normal recent and remote memory, with an apparently normal attention span and concentration ability.  Cranial nerves: Facial symmetry is present. There is good sensation of the face to pinprick and soft touch bilaterally. The strength of the facial muscles and the muscles to head turning and shoulder shrug are normal bilaterally. Speech is well enunciated, no aphasia or dysarthria is noted. Extraocular movements are full. Visual fields are full. The tongue is midline, and the patient has symmetric elevation of the soft palate. No obvious hearing deficits are noted.  Motor: The motor testing reveals 5 over 5 strength of all 4 extremities. Good symmetric motor tone is noted throughout.  Sensory: Sensory testing is intact to pinprick, soft touch, vibration sensation, and position sense on all 4 extremities. No evidence of extinction is noted.  Coordination: Cerebellar testing reveals good finger-nose-finger and heel-to-shin bilaterally.  Gait and station: Gait is normal. Tandem gait is normal. Romberg is negative. No drift is seen.  Reflexes: Deep tendon reflexes are symmetric and normal bilaterally. Toes are downgoing bilaterally.   CT head 09/07/17:  IMPRESSION: This CT scan of the head without contrast shows the following: 1.    Brain volume is normal for age. 2.    There do not appear to be any significant chronic ischemic changes. 3.    The dominant left vertebral artery is heavily calcified. 4.    There are no acute findings.  * CT scan images were reviewed online. I agree with the written report.    Assessment/Plan:  1.  Memory disturbance  2.  Cerebrovascular disease by CT brain  The patient will undergo CT angiogram of the head and neck to evaluate the posterior circulation and carotid vessel circulation.  The patient will have blood work done today to document renal function.  She will  follow-up for her next scheduled visit.  Jill Alexanders MD 10/16/2017 11:32 AM  [facility] [address] [place], [place] [address]  Phone [phone] Fax [phone]

## 2017-10-16 NOTE — Patient Instructions (Signed)
We will get CTA of the head and neck.

## 2017-10-17 LAB — COMPREHENSIVE METABOLIC PANEL
ALT: 14 IU/L (ref 0–32)
AST: 14 IU/L (ref 0–40)
Albumin/Globulin Ratio: 1.9 (ref 1.2–2.2)
Albumin: 4.6 g/dL (ref 3.5–4.7)
Alkaline Phosphatase: 72 IU/L (ref 39–117)
BUN/Creatinine Ratio: 15 (ref 12–28)
BUN: 12 mg/dL (ref 8–27)
Bilirubin Total: 0.4 mg/dL (ref 0.0–1.2)
CO2: 24 mmol/L (ref 20–29)
Calcium: 9.6 mg/dL (ref 8.7–10.3)
Chloride: 94 mmol/L — ABNORMAL LOW (ref 96–106)
Creatinine, Ser: 0.79 mg/dL (ref 0.57–1.00)
GFR calc Af Amer: 79 mL/min/{1.73_m2} (ref >59)
GFR calc non Af Amer: 68 mL/min/{1.73_m2} (ref >59)
Globulin, Total: 2.4 g/dL (ref 1.5–4.5)
Glucose: 102 mg/dL — ABNORMAL HIGH (ref 65–99)
Potassium: 3.9 mmol/L (ref 3.5–5.2)
Sodium: 135 mmol/L (ref 134–144)
Total Protein: 7 g/dL (ref 6.0–8.5)

## 2017-10-19 ENCOUNTER — Telehealth: Payer: Self-pay | Admitting: Neurology

## 2017-10-19 NOTE — Telephone Encounter (Signed)
[employer] pending faxed clinical notes  °

## 2017-10-19 NOTE — Telephone Encounter (Signed)
Called [facility] and gave them all the information for GI.

## 2017-10-19 NOTE — Telephone Encounter (Signed)
[name] [name]: [id number] (exp. 10/19/17 to 11/18/17) order sent to GI. They will reach out to the pt to schedule.

## 2017-10-19 NOTE — Telephone Encounter (Signed)
Stacy with [facility] calling regarding pending facility selection for CT. She can be reached at [phone] Option 1.

## 2017-10-20 NOTE — Telephone Encounter (Signed)
Patient called and stated that she received a letter saying the botox was denied. She gave me the number provided as [phone].  I called [employer] and they stated that it was denied because she has an approval on file with another doctor.   I called the patient and informed her of this and she said she was not going to receive the injections in the other office and told me it was ok to switch it over to our office.

## 2017-10-21 ENCOUNTER — Ambulatory Visit
Admission: RE | Admit: 2017-10-21 | Discharge: 2017-10-21 | Disposition: A | Discharge status: Home / Self Care | Payer: Medicare PPO | Source: Ambulatory Visit | Attending: Internal Medicine | Admitting: Internal Medicine

## 2017-10-21 DIAGNOSIS — Z1231 Encounter for screening mammogram for malignant neoplasm of breast: Secondary | ICD-10-CM

## 2017-10-28 NOTE — Telephone Encounter (Signed)
I called and updated authorization to our office, I spoke with Chanique C. Authorization number is [id number] (05/13/18). DW

## 2017-11-04 ENCOUNTER — Ambulatory Visit: Payer: Medicare PPO | Admitting: Neurology

## 2017-11-04 ENCOUNTER — Encounter: Payer: Self-pay | Admitting: Neurology

## 2017-11-04 VITALS — BP 160/69 | HR 61 | Ht 63.0 in | Wt 135.0 lb

## 2017-11-04 DIAGNOSIS — G245 Blepharospasm: Secondary | ICD-10-CM | POA: Diagnosis not present

## 2017-11-04 MED ORDER — ONABOTULINUMTOXINA 100 UNITS IJ SOLR
100.0000 [IU] | Freq: Once | INTRAMUSCULAR | Status: AC
  Administered 2017-11-04: 100 [IU] via INTRAMUSCULAR

## 2017-11-04 NOTE — Progress Notes (Signed)
**  Botox 100 units x 1 vials, NDC 9584-4171-27, Lot [id number], Exp 04/2020, office supply.//mck,rn**

## 2017-11-04 NOTE — Progress Notes (Signed)
PATIENT: Kristi Montgomery DOB: [birth year]  Chief Complaint  Patient presents with  . Blepharospasm    Botox 100 units - office supply     HISTORICAL  Kristi Montgomery is a 84 year old female, seen in request by my colleague Dr. Jannifer Franklin for evaluation of EMG guided botulism toxin injection for blepharospasm, initial evaluation was on September 14, 2017.  She had a history of memory loss, Mini-Mental Status Examination is 30/30 at most recent office visit on August 06, 2017, peripheral neuropathy, mild balance issues, melanoma in her legs.  I was able to see her most recent office visit by Dr. Deboraha Sprang on May 26, 2017, she was initially evaluated in 2017 by Dr. Deboraha Sprang with diagnosis of Meige syndrome, she had cataract surgery in December 2016, shortly afterwards, she developed frequent eye blinking, sometimes she has to use her fingers to open her eyelid to watch TV, this has significant impact on the quality of his life, she has to stop driving, playing bridges, getting worse when she is trying to focus, will be anxious, there was also abnormal facial movement.     She had her first Botox injection on January 31, 2016, at Dr. Liane Comber office, unfortunately, she developed droopy eyelid.  Use the Botox a 15 units,  She had her second Botox injection by Dr. Kristeen Miss at [facility] in 2017, shortly afterwards, she developed left extraocular eye muscle weakness, strabismus, eventually disappeared in 2 months.  She wants to continue EMG guided botulism toxin injection for her frequent eye blinking, mouth movement, which has caused significant social embarrassment, limitation in her daily function.  UPDATE Sept 11 2019: Potential side effect explained to her, this is her first EMG guided Botox A injection for her Meige syndrome.   REVIEW OF SYSTEMS: Full 14 system review of systems performed and notable only for   ALLERGIES: Allergies  Allergen Reactions  . Amoxicillin Other (See Comments)   Tongue swelling   . Codeine Other (See Comments)  . Gabapentin     Sever constipation  . Latex Rash    band aids, tape    HOME MEDICATIONS: Current Outpatient Medications  Medication Sig Dispense Refill  . ALPRAZolam (XANAX) 0.25 MG tablet Take 0.25 mg by mouth as needed for anxiety.    . cholecalciferol (VITAMIN D) 1000 UNITS tablet Take 1,000 Units by mouth daily.    . Multiple Vitamins-Minerals (PRESERVISION AREDS 2 PO) Take 1 Dose by mouth daily.    Vladimir Faster Glycol-Propyl Glycol (SYSTANE OP) Apply 1 Dose to eye as needed.    . triamterene-hydrochlorothiazide (MAXZIDE-25) 37.5-25 MG per tablet Take 1 tablet by mouth daily.    . verapamil (COVERA HS) 240 MG (CO) 24 hr tablet Take 240 mg by mouth at bedtime.    Marland Kitchen zolpidem (AMBIEN) 10 MG tablet Take 0.5 tablet at bedtime as needed for sleep.     No current facility-administered medications for this visit.     PAST MEDICAL HISTORY: Past Medical History:  Diagnosis Date  . Anxiety   . Cancer ([place])   . Cataract   . Chest pain    myoview 04/01/12-normal  . History of tobacco abuse   . HTN (hypertension)   . 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/06/2017    PAST SURGICAL HISTORY: Past Surgical History:  Procedure Laterality Date  . CHOLECYSTECTOMY    . MELANOMA EXCISION     x5    FAMILY HISTORY: Family History  Problem Relation Age of Onset  . Congestive Heart Failure Mother   .  Heart failure Mother   . Heart attack Father        age 29  . Heart Problems Brother   . Cancer Brother   . Cancer Brother        pancreatic   . Cancer Sister        breast   . Cancer Sister        breast  . Breast cancer Neg Hx     SOCIAL HISTORY: Social History   Socioeconomic History  . Marital status: Married    Spouse name: Marchia Bond  . Number of children: 2  . Years of education: 16  . Highest education level: Not on file  Occupational History  . Occupation: Retired  Scientific laboratory technician  . Financial resource strain: Not on file  . Food insecurity:     Worry: Not on file    Inability: Not on file  . Transportation needs:    Medical: Not on file    Non-medical: Not on file  Tobacco Use  . Smoking status: Former Smoker    Last attempt to quit: 02/25/1988    Years since quitting: 29.7  . Smokeless tobacco: Never Used  Substance and Sexual Activity  . Alcohol use: No  . Drug use: No  . Sexual activity: Never    Birth control/protection: Abstinence  Lifestyle  . Physical activity:    Days per week: Not on file    Minutes per session: Not on file  . Stress: Not on file  Relationships  . Social connections:    Talks on phone: Not on file    Gets together: Not on file    Attends religious service: Not on file    Active member of club or organization: Not on file    Attends meetings of clubs or organizations: Not on file    Relationship status: Not on file  . Intimate partner violence:    Fear of current or ex partner: Not on file    Emotionally abused: Not on file    Physically abused: Not on file    Forced sexual activity: Not on file  Other Topics Concern  . Not on file  Social History Narrative   Lives w/ husband and daughter   Caffeine use: 1 cup per day   Right handed      PHYSICAL EXAM   Vitals:   11/04/17 1415  BP: (!) 160/69  Pulse: 61  Weight: 135 lb (61.2 kg)  Height: 5\' 3"  (1.6 m)    Not recorded      Body mass index is 23.91 kg/m.  PHYSICAL EXAMNIATION:  Gen: NAD, conversant, well nourised, obese, well groomed                     Cardiovascular: Regular rate rhythm, no peripheral edema, warm, nontender. Eyes: Conjunctivae clear without exudates or hemorrhage Neck: Supple, no carotid bruits. Pulmonary: Clear to auscultation bilaterally   NEUROLOGICAL EXAM:  MENTAL STATUS: Speech:    Speech is normal; fluent and spontaneous with normal comprehension.  Cognition:     Orientation to time, place and person     Normal recent and remote memory     Normal Attention span and concentration      Normal Language, naming, repeating,spontaneous speech     Fund of knowledge   CRANIAL NERVES: CN II: Visual fields are full to confrontation. Fundoscopic exam is normal with sharp discs and no vascular changes. Pupils are round equal and briskly reactive to  light. CN III, IV, VI: extraocular movement are normal. No ptosis. CN V: Facial sensation is intact to pinprick in all 3 divisions bilaterally. Corneal responses are intact.  CN VII: Face is symmetric with normal eye closure and smile. CN VIII: Hearing is normal to rubbing fingers CN IX, X: Palate elevates symmetrically. Phonation is normal. CN XI: Head turning and shoulder shrug are intact CN XII: Tongue is midline with normal movements and no atrophy.  MOTOR: She has no muscle weakness, normal limb muscle tone, frequent yes yes head titubation, frequent grimace movement of her face, frequent eye blinking, posturing of oral facial movement, mild left shoulder elevation, mild right tilt,  REFLEXES: Reflexes are 2+ and symmetric at the biceps, triceps, knees, and ankles. Plantar responses are flexor.  SENSORY: Intact to light touch, pinprick, positional sensation and vibratory sensation are intact in fingers and toes.  COORDINATION: Rapid alternating movements and fine finger movements are intact. There is no dysmetria on finger-to-nose and heel-knee-shin.    GAIT/STANCE: Posture is normal. Gait is steady with normal steps, base, arm swing, and turning. Heel and toe walking are normal. Tandem gait is normal.  Romberg is absent.   DIAGNOSTIC DATA (LABS, IMAGING, TESTING) - I reviewed patient records, labs, notes, testing and imaging myself where available.   ASSESSMENT AND PLAN  Kristi Montgomery is a 84 y.o. female   Meige Syndrome with blepharospasm   EMG guided BOTOX A injection, we used 100 units. Used 70 units discarded 30 units  Left orbicularis oculi at 2, 3, 6, 7, 10 oclock position (2.5 unitsx5=12.5 units) Right  orbicularis oculi at 2, 4, 6, 8, 9, o'clock position (2.5 units x 5,= 12.5 units)  Right frontalis 2.52= 5 units Left frontalis 2.52= 5 units  Right corrugate 5 units Left corrugate 5 units Procerus 5 units  Left zygomatic major 2.5 units Right zygomatic major 2.5 units  Left depressor labial anguli 2.5 units Right depressor labial anguli 2.5 units  Left platysmas 5 units  Right platysmas 5 units  Marcial Pacas, M.D. Ph.D.  [facility] [address], [place], [place] [address] Ph: [phone] Fax: [phone]  CC: Referring Provider

## 2017-11-20 ENCOUNTER — Telehealth: Payer: Self-pay | Admitting: Neurology

## 2017-11-20 NOTE — Telephone Encounter (Signed)
Spoke with Kristi Montgomery who sts. her entire eye is swollen, painful, pain some better since yesterday. Sts. she believes she has an infection.  Sts. there is drainage from her eye, lower eyelid is very red, was crusted shut this morning.  Sts. she has antibiotic drops a previous physician gave her and she is using those. I spoke with Dr. Krista Blue and referred pt. to her ophthalmologist for treatment of infection.  She verbalized understanding of same/fim

## 2017-11-20 NOTE — Telephone Encounter (Signed)
Pt said her left eye is very red and swollen when she got up this morning. She said it has been like this for a couple of days. It has been difficult to get drops in her eye. She is wanting to be seen today or if a script could be called in. Please call to advise

## 2017-11-20 NOTE — Telephone Encounter (Signed)
Please call patient, to see if Sept 11 injection has helped her, red eye sounds like eye issue, less likely related to injection, if  She has a ophthalmologist, she may see him first.

## 2017-12-09 ENCOUNTER — Telehealth: Payer: Self-pay | Admitting: Neurology

## 2017-12-09 MED ORDER — MEMANTINE HCL 5 MG PO TABS: ORAL_TABLET | ORAL | 0 refills | Status: DC

## 2017-12-09 NOTE — Telephone Encounter (Signed)
Faxed printed/signed rx memantine to [facility] at [phone]. Received fax confirmation.

## 2017-12-09 NOTE — Addendum Note (Signed)
Addended by: Kathrynn Ducking on: 12/09/2017 12:52 PM   Modules accepted: Orders

## 2017-12-09 NOTE — Telephone Encounter (Signed)
I called patient back. She stated she is having a hard to carrying on a conversation, more withdrawn, having memory problems and unable to think clearly. She is frustrated. She is not going out to socialize anymore like she used to. This all is making her depressed. She would like to know what Dr. Jannifer Franklin recommends.

## 2017-12-09 NOTE — Telephone Encounter (Signed)
I called the patient.  The patient does have a mild memory problem.  She is having some issues with word finding, this is becoming frustrating for her.  I will initiate a medication such as Namenda for the memory.  She is having a lot of diarrhea already, probably could not tolerate Aricept.  There could be some underlying depression as well, but we will start the [place] first.

## 2017-12-09 NOTE — Telephone Encounter (Signed)
Patient calling to discuss getting a medication.She would not tell me why she needed medication.Marland Kitchen

## 2017-12-22 ENCOUNTER — Telehealth: Payer: Self-pay | Admitting: *Deleted

## 2017-12-22 NOTE — Telephone Encounter (Signed)
Received call from pt. She is out of town [facility]) and forgot her [place]. Needs one wk. rx. called to [facility], phone# [phone]. Per her request, rx. for Namenda 5mg  #21 tablets, (should be on day 1 of wk. 3 of her titration) qam and 10mg  qhs called [facility]/fim

## 2018-01-05 ENCOUNTER — Encounter (HOSPITAL_COMMUNITY): Payer: Self-pay | Admitting: Emergency Medicine

## 2018-01-05 ENCOUNTER — Emergency Department (HOSPITAL_COMMUNITY): Payer: Medicare PPO

## 2018-01-05 ENCOUNTER — Other Ambulatory Visit: Payer: Self-pay

## 2018-01-05 ENCOUNTER — Emergency Department (HOSPITAL_COMMUNITY)
Admission: EM | Admit: 2018-01-05 | Discharge: 2018-01-05 | Disposition: A | Discharge status: Home / Self Care | Payer: Medicare PPO | Attending: Emergency Medicine | Admitting: Emergency Medicine

## 2018-01-05 DIAGNOSIS — Z91040 Latex allergy status: Secondary | ICD-10-CM | POA: Insufficient documentation

## 2018-01-05 DIAGNOSIS — Z859 Personal history of malignant neoplasm, unspecified: Secondary | ICD-10-CM | POA: Insufficient documentation

## 2018-01-05 DIAGNOSIS — I10 Essential (primary) hypertension: Secondary | ICD-10-CM | POA: Insufficient documentation

## 2018-01-05 DIAGNOSIS — F419 Anxiety disorder, unspecified: Secondary | ICD-10-CM | POA: Diagnosis not present

## 2018-01-05 DIAGNOSIS — R531 Weakness: Secondary | ICD-10-CM | POA: Insufficient documentation

## 2018-01-05 DIAGNOSIS — Z87891 Personal history of nicotine dependence: Secondary | ICD-10-CM | POA: Diagnosis not present

## 2018-01-05 DIAGNOSIS — Z9049 Acquired absence of other specified parts of digestive tract: Secondary | ICD-10-CM | POA: Diagnosis not present

## 2018-01-05 DIAGNOSIS — Z79899 Other long term (current) drug therapy: Secondary | ICD-10-CM | POA: Diagnosis not present

## 2018-01-05 LAB — CBC
HCT: 32.2 % — ABNORMAL LOW (ref 36.0–46.0)
Hemoglobin: 11.4 g/dL — ABNORMAL LOW (ref 12.0–15.0)
MCH: 36.8 pg — ABNORMAL HIGH (ref 26.0–34.0)
MCHC: 35.4 g/dL (ref 30.0–36.0)
MCV: 103.9 fL — ABNORMAL HIGH (ref 80.0–100.0)
Platelets: 235 10*3/uL (ref 150–400)
RBC: 3.1 MIL/uL — ABNORMAL LOW (ref 3.87–5.11)
RDW: 15.1 % (ref 11.5–15.5)
WBC: 7 10*3/uL (ref 4.0–10.5)
nRBC: 0 % (ref 0.0–0.2)

## 2018-01-05 LAB — BASIC METABOLIC PANEL
Anion gap: 8 (ref 5–15)
BUN: 13 mg/dL (ref 8–23)
CO2: 28 mmol/L (ref 22–32)
Calcium: 8.9 mg/dL (ref 8.9–10.3)
Chloride: 93 mmol/L — ABNORMAL LOW (ref 98–111)
Creatinine, Ser: 0.76 mg/dL (ref 0.44–1.00)
GFR calc Af Amer: >60 mL/min (ref >60)
GFR calc non Af Amer: >60 mL/min (ref >60)
Glucose, Bld: 157 mg/dL — ABNORMAL HIGH (ref 70–99)
Potassium: 3.5 mmol/L (ref 3.5–5.1)
Sodium: 129 mmol/L — ABNORMAL LOW (ref 135–145)

## 2018-01-05 LAB — URINALYSIS, ROUTINE W REFLEX MICROSCOPIC
Bilirubin Urine: NEGATIVE
Glucose, UA: NEGATIVE mg/dL
Hgb urine dipstick: NEGATIVE
Ketones, ur: NEGATIVE mg/dL
Leukocytes, UA: NEGATIVE
Nitrite: NEGATIVE
Protein, ur: NEGATIVE mg/dL
Specific Gravity, Urine: 1.004 — ABNORMAL LOW (ref 1.005–1.030)
pH: 8 (ref 5.0–8.0)

## 2018-01-05 LAB — CBG MONITORING, ED: Glucose-Capillary: 126 mg/dL — ABNORMAL HIGH (ref 70–99)

## 2018-01-05 MED ORDER — SODIUM CHLORIDE 0.9 % IV BOLUS
1000.0000 mL | Freq: Once | INTRAVENOUS | Status: AC
  Administered 2018-01-05: 1000 mL via INTRAVENOUS

## 2018-01-05 NOTE — ED Provider Notes (Signed)
[facility] DEPT Provider Note   CSN: [id number] Arrival date & time: 01/05/18  1245     History   Chief Complaint Chief Complaint  Patient presents with  . Weakness    HPI Kristi Montgomery is a 84 y.o. female.  HPI   Kristi Montgomery is a 84 y.o. female, with a history of HTN, anxiety, lower extremity neuropathy, and memory problems, presenting to the ED with generalized weakness, fatigue, and malaise for at least the last 2 months. She also notes unsteadiness on her feet and occasional chest pressure.  She states, "It is like my vision is playing tricks on me."  Patient states she had two sisters die this year, one in January and one in 09-05-2022. Was very close with the sister who died in 09/05/2022. "Now she feels like she doesn't have anyone to communicate with, feels lost," per husband.   She was placed on Namenda by her neurologist December 09, 2017.  She states she feels as though this made her unsteadiness and occasional dizziness worse so she has stopped taking it, but symptoms did not improve.  Denies vision loss, syncope, headaches, neck pain, shortness of breath, recent illness, abdominal pain, N/V/D, urinary symptoms, or any other complaints.   Past Medical History:  Diagnosis Date  . Anxiety   . Cancer ([place])   . Cataract   . Chest pain    myoview 04/01/12-normal  . History of tobacco abuse   . HTN (hypertension)   . 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/06/2017    Patient Active Problem List   Diagnosis Date Noted  . Blepharospasm 09/15/2017  . 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/06/2017  . Essential hypertension 07/29/2012  . CHEST PAIN UNSPECIFIED 01/25/2009  . DYSPHAGIA UNSPECIFIED 01/25/2009    Past Surgical History:  Procedure Laterality Date  . CHOLECYSTECTOMY    . MELANOMA EXCISION     x5     OB History   None      Home Medications    Prior to Admission medications     Medication Sig Start Date End Date Taking? Authorizing Provider  ALPRAZolam (XANAX) 0.25 MG tablet Take 0.125 mg by mouth 2 (two) times daily. 12/09/17  Yes [provider]  Multiple Vitamins-Minerals (PRESERVISION AREDS 2 PO) Take 1 Dose by mouth daily.   Yes [provider]  Polyethyl Glycol-Propyl Glycol (SYSTANE OP) Apply 1 Dose to eye as needed.   Yes [provider]  triamterene-hydrochlorothiazide (MAXZIDE-25) 37.5-25 MG per tablet Take 1 tablet by mouth daily.   Yes [provider]  verapamil (COVERA HS) 240 MG (CO) 24 hr tablet Take 240 mg by mouth at bedtime.   Yes [provider]  zolpidem (AMBIEN) 10 MG tablet Take 5 mg by mouth at bedtime as needed for sleep. Take 0.5 tablet at bedtime as needed for sleep.   Yes [provider]  memantine (NAMENDA) 5 MG tablet Take 1 tablet daily for one week, then take 1 tablet twice daily for one week, then take 1 tablet in the morning and 2 in the evening for one week, then take 2 tablets twice daily Patient not taking: Reported on 01/05/2018 12/09/17   Kathrynn Ducking, MD    Family History Family History  Problem Relation Age of Onset  . Congestive Heart Failure Mother   . Heart failure Mother   . Heart attack Father        age 62  .  Heart Problems Brother   . Cancer Brother   . Cancer Brother        pancreatic   . Cancer Sister        breast   . Cancer Sister        breast  . Breast cancer Neg Hx     Social History Social History   Tobacco Use  . Smoking status: Former Smoker    Last attempt to quit: 02/25/1988    Years since quitting: 29.8  . Smokeless tobacco: Never Used  Substance Use Topics  . Alcohol use: No  . Drug use: No     Allergies   Amoxicillin; Codeine; Gabapentin; and Latex   Review of Systems Review of Systems  Constitutional: Positive for activity change and fatigue. Negative for chills, diaphoresis and fever.  Respiratory: Negative for  shortness of breath.   Gastrointestinal: Negative for abdominal pain, diarrhea, nausea and vomiting.  Genitourinary: Negative for dysuria and hematuria.  Musculoskeletal: Negative for back pain and neck pain.  Neurological: Positive for weakness (generalized) and light-headedness. Negative for seizures, syncope and numbness.  All other systems reviewed and are negative.    Physical Exam Updated Vital Signs BP (!) 176/59   Pulse (!) 51   Temp 97.7 F (36.5 C) (Oral)   Resp 19   SpO2 99%   Physical Exam  Constitutional: She is oriented to person, place, and time. She appears well-developed. No distress.  HENT:  Head: Normocephalic and atraumatic.  Eyes: Pupils are equal, round, and reactive to light. Conjunctivae and EOM are normal.  Neck: Normal range of motion. Neck supple.  Cardiovascular: Normal rate, regular rhythm, normal heart sounds and intact distal pulses.  Pulmonary/Chest: Effort normal and breath sounds normal. No respiratory distress.  Abdominal: Soft. There is no tenderness. There is no guarding.  Musculoskeletal: She exhibits no edema.  Lymphadenopathy:    She has no cervical adenopathy.  Neurological: She is alert and oriented to person, place, and time.  Sensation grossly intact to light touch in the extremities. No noted speech deficits. No aphasia. Patient handles oral secretions without difficulty. No noted swallowing defects.  Equal grip strength bilaterally. Strength 5/5 in the upper extremities. Strength 5/5 in the hips, knees, and ankles bilaterally.  Patient seems unsteady on her feet.  Her steps are slow, deliberate, and a little shaky.  She does not have a problem standing in place, but she seems to panic when she takes a step and no one is holding onto her. Coordination intact including heel to shin and finger to nose.  Cranial nerves III-XII grossly intact.  No facial droop.   Skin: Skin is warm and dry. She is not diaphoretic.  Psychiatric: She  has a normal mood and affect. Her behavior is normal.  Nursing note and vitals reviewed.    ED Treatments / Results  Labs (all labs ordered are listed, but only abnormal results are displayed) Labs Reviewed  BASIC METABOLIC PANEL - Abnormal; Notable for the following components:      Result Value   Sodium 129 (*)    Chloride 93 (*)    Glucose, Bld 157 (*)    All other components within normal limits  CBC - Abnormal; Notable for the following components:   RBC 3.10 (*)    Hemoglobin 11.4 (*)    HCT 32.2 (*)    MCV 103.9 (*)    MCH 36.8 (*)    All other components within normal limits  URINALYSIS, ROUTINE  W REFLEX MICROSCOPIC - Abnormal; Notable for the following components:   Color, Urine COLORLESS (*)    Specific Gravity, Urine 1.004 (*)    All other components within normal limits  CBG MONITORING, ED - Abnormal; Notable for the following components:   Glucose-Capillary 126 (*)    All other components within normal limits    EKG EKG Interpretation  Date/Time:  Tuesday January 05 2018 13:01:48 EST Ventricular Rate:  97 PR Interval:    QRS Duration: 86 QT Interval:  361 QTC Calculation: 422 R Axis:   71 Text Interpretation:  Sinus rhythm Supraventricular bigeminy Short PR interval Low voltage, precordial leads Borderline repolarization abnormality Confirmed by Orrin Brigham [phone]) on 01/05/2018 1:29:30 PM   Radiology Dg Chest 2 View  Result Date: 01/05/2018 CLINICAL DATA:  Generalized weakness and malaise for the past month include with intermittent headache and dizziness. Possible medication side effect. History of hypertension, former smoker. EXAM: CHEST - 2 VIEW COMPARISON:  CT scan of the chest of September 04, 2004 FINDINGS: The lungs are well-expanded. There is no focal infiltrate. There is a stable calcified nodule in the superior segment of the left lower lobe consistent with previous granulomatous infection. There is no pleural effusion. The heart and pulmonary  vascularity are normal. There is calcification in the wall of the thoracic aorta. The bony thorax exhibits no acute abnormality. IMPRESSION: There is no active cardiopulmonary disease. Evidence of previous granulomatous infection. Thoracic aortic atherosclerosis. Electronically Signed   By: David  Martinique M.D.   On: 01/05/2018 14:47   Ct Head Wo Contrast  Result Date: 01/05/2018 CLINICAL DATA:  Generalized weakness EXAM: CT HEAD WITHOUT CONTRAST TECHNIQUE: Contiguous axial images were obtained from the base of the skull through the vertex without intravenous contrast. COMPARISON:  09/07/2017 FINDINGS: Brain: No evidence of acute infarction, hemorrhage, hydrocephalus, extra-axial collection or mass lesion/mass effect. Vascular: Stable calcification of the dominant left vertebral artery is noted. Skull: Normal. Negative for fracture or focal lesion. Sinuses/Orbits: No acute finding. Other: None. IMPRESSION: No acute intracranial abnormality noted. Electronically Signed   By: Inez Catalina M.D.   On: 01/05/2018 14:42    Procedures Procedures (including critical care time)  Medications Ordered in ED Medications  sodium chloride 0.9 % bolus 1,000 mL (0 mLs Intravenous Stopped 01/05/18 1638)     Initial Impression / Assessment and Plan / ED Course  I have reviewed the triage vital signs and the nursing notes.  Pertinent labs & imaging results that were available during my care of the patient were reviewed by me and considered in my medical decision making (see chart for details).     Patient presents with generalized weakness, among other complaints.  Each of her complaints seem to be chronic in nature.  Patient is nontoxic appearing, afebrile, not tachycardic, not tachypneic, not hypotensive, maintains excellent SPO2 on room air, and is in no apparent distress.  She did have some hyponatremia, which was addressed with IV fluids.  After IV fluids, patient stated she felt much better.  She was able  to ambulate prior to discharge without assistance.  PCP follow-up.  Strict return precautions discussed. Patient voices understanding of these instructions, accepts the plan, and is comfortable with discharge.   Findings and plan of care discussed with Malvin Johns, MD. Dr. Tamera Punt personally evaluated and examined this patient.  Vitals:   01/05/18 1254 01/05/18 1345 01/05/18 1400  BP: (!) 176/59 (!) 153/75 (!) 163/67  Pulse: (!) 51 (!) 48 71  Resp:  19 (!) 22 17  Temp: 97.7 F (36.5 C)    TempSrc: Oral    SpO2: 99% 99% 97%   Vitals:   01/05/18 1345 01/05/18 1400 01/05/18 1500 01/05/18 1545  BP: (!) 153/75 (!) 163/67 (!) 155/68 (!) 152/61  Pulse: (!) 48 71 76 76  Resp: (!) 22 17 16 18   Temp:      TempSrc:      SpO2: 99% 97% 98% 100%     Orthostatic VS for the past 24 hrs:  BP- Lying Pulse- Lying BP- Sitting Pulse- Sitting BP- Standing at 0 minutes Pulse- Standing at 0 minutes  01/05/18 1417 167/68 96 172/65 84 154/71 93      Final Clinical Impressions(s) / ED Diagnoses   Final diagnoses:  Generalized weakness    ED Discharge Orders    None       Layla Maw 01/05/18 1806    Malvin Johns, MD 01/06/18 1222

## 2018-01-05 NOTE — ED Triage Notes (Addendum)
Patient generalized weakness and "not feeling good" for approximately one month. Reports dizziness with intermittent headache x1 month. Denies N/V/D, abdominal pain. Reports neurologist prescribed medication for mental disorder with dizziness as side effect.

## 2018-01-05 NOTE — ED Notes (Signed)
Patient given discharge teaching and verbalized understanding. Patient ambulated out of ED with a steady gait. 

## 2018-01-05 NOTE — ED Notes (Signed)
Patient transported to CT 

## 2018-01-05 NOTE — Discharge Instructions (Signed)
Be sure to stay well-hydrated.  Eat regular, small meals throughout the day. Follow-up with your primary care provider as soon as possible for further assessment and management. Return to the ED as needed.

## 2018-01-15 DIAGNOSIS — E871 Hypo-osmolality and hyponatremia: Secondary | ICD-10-CM | POA: Insufficient documentation

## 2018-01-18 ENCOUNTER — Encounter: Payer: Self-pay | Admitting: Radiology

## 2018-01-18 ENCOUNTER — Other Ambulatory Visit: Payer: Self-pay | Admitting: Internal Medicine

## 2018-01-18 ENCOUNTER — Ambulatory Visit
Admission: RE | Admit: 2018-01-18 | Discharge: 2018-01-18 | Disposition: A | Discharge status: Home / Self Care | Payer: Medicare PPO | Source: Ambulatory Visit | Attending: Internal Medicine | Admitting: Internal Medicine

## 2018-01-18 DIAGNOSIS — R1030 Lower abdominal pain, unspecified: Secondary | ICD-10-CM

## 2018-01-18 DIAGNOSIS — R194 Change in bowel habit: Secondary | ICD-10-CM

## 2018-01-18 DIAGNOSIS — R110 Nausea: Secondary | ICD-10-CM

## 2018-01-18 DIAGNOSIS — R1031 Right lower quadrant pain: Secondary | ICD-10-CM

## 2018-01-18 DIAGNOSIS — E871 Hypo-osmolality and hyponatremia: Secondary | ICD-10-CM

## 2018-01-18 MED ORDER — IOPAMIDOL (ISOVUE-300) INJECTION 61%
100.0000 mL | Freq: Once | INTRAVENOUS | Status: AC | PRN
  Administered 2018-01-18: 100 mL via INTRAVENOUS

## 2018-02-04 ENCOUNTER — Ambulatory Visit (INDEPENDENT_AMBULATORY_CARE_PROVIDER_SITE_OTHER): Payer: Medicare PPO | Admitting: Neurology

## 2018-02-04 ENCOUNTER — Encounter: Payer: Self-pay | Admitting: Neurology

## 2018-02-04 VITALS — BP 159/71 | HR 88 | Ht 63.0 in | Wt 140.2 lb

## 2018-02-04 DIAGNOSIS — G245 Blepharospasm: Secondary | ICD-10-CM | POA: Diagnosis not present

## 2018-02-04 MED ORDER — ONABOTULINUMTOXINA 100 UNITS IJ SOLR
100.0000 [IU] | Freq: Once | INTRAMUSCULAR | Status: AC
  Administered 2018-02-04: 100 [IU] via INTRAMUSCULAR

## 2018-02-04 NOTE — Progress Notes (Signed)
**  Botox 100 units x 1 vials, NDC 8832-5498-26, Lot [id number], Exp 06/2020, office supply.//mck,rn**

## 2018-02-04 NOTE — Progress Notes (Signed)
PATIENT: Kristi Montgomery DOB: [birth year]  Chief Complaint  Patient presents with  . Blepharospasm    Botox 100 units x 1 vial - office supply     HISTORICAL  Kristi Montgomery is a 84 year old female, seen in request by my colleague Dr. Jannifer Franklin for evaluation of EMG guided botulism toxin injection for blepharospasm, initial evaluation was on September 14, 2017.  She had a history of memory loss, Mini-Mental Status Examination is 30/30 at most recent office visit on August 06, 2017, peripheral neuropathy, mild balance issues, melanoma in her legs.  I was able to see her most recent office visit by Dr. Deboraha Sprang on May 26, 2017, she was initially evaluated in 2017 by Dr. Deboraha Sprang with diagnosis of Meige syndrome, she had cataract surgery in December 2016, shortly afterwards, she developed frequent eye blinking, sometimes she has to use her fingers to open her eyelid to watch TV, this has significant impact on the quality of his life, she has to stop driving, playing bridges, getting worse when she is trying to focus, will be anxious, there was also abnormal facial movement.     She had her first Botox injection on January 31, 2016, at Dr. Liane Comber office, unfortunately, she developed droopy eyelid.  Use the Botox a 15 units,  She had her second Botox injection by Dr. Kristeen Miss at [facility] in 2017, shortly afterwards, she developed left extraocular eye muscle weakness, strabismus, eventually disappeared in 2 months.  She wants to continue EMG guided botulism toxin injection for her frequent eye blinking, mouth movement, which has caused significant social embarrassment, limitation in her daily function.  UPDATE Sept 11 2019: Potential side effect explained to her, this is her first EMG guided Botox A injection for her Meige syndrome.   UPDATE Feb 04 2018: She responded well to previous injection  REVIEW OF SYSTEMS: Full 14 system review of systems performed and notable only for    ALLERGIES: Allergies  Allergen Reactions  . Amoxicillin Other (See Comments)    Tongue swelling  Has patient had a PCN reaction causing immediate rash, facial/tongue/throat swelling, SOB or lightheadedness with hypotension: Unknown Has patient had a PCN reaction causing severe rash involving mucus membranes or skin necrosis: Unknown Has patient had a PCN reaction that required hospitalization: Unknown Has patient had a PCN reaction occurring within the last 10 years: Unknown If all of the above answers are "NO", then may proceed with Cephalosporin use.   . Codeine Other (See Comments)  . Gabapentin     Sever constipation  . Latex Rash    band aids, tape    HOME MEDICATIONS: Current Outpatient Medications  Medication Sig Dispense Refill  . ALPRAZolam (XANAX) 0.25 MG tablet Take 0.125 mg by mouth 2 (two) times daily.    . memantine (NAMENDA) 5 MG tablet Take 1 tablet daily for one week, then take 1 tablet twice daily for one week, then take 1 tablet in the morning and 2 in the evening for one week, then take 2 tablets twice daily 70 tablet 0  . Multiple Vitamins-Minerals (PRESERVISION AREDS 2 PO) Take 1 Dose by mouth daily.    Vladimir Faster Glycol-Propyl Glycol (SYSTANE OP) Apply 1 Dose to eye as needed.    . triamterene-hydrochlorothiazide (MAXZIDE-25) 37.5-25 MG per tablet Take 1 tablet by mouth daily.    . verapamil (COVERA HS) 240 MG (CO) 24 hr tablet Take 240 mg by mouth at bedtime.    Marland Kitchen zolpidem (AMBIEN) 10 MG tablet  Take 5 mg by mouth at bedtime as needed for sleep. Take 0.5 tablet at bedtime as needed for sleep.     No current facility-administered medications for this visit.     PAST MEDICAL HISTORY: Past Medical History:  Diagnosis Date  . Anxiety   . Cancer ([place])   . Cataract   . Chest pain    myoview 04/01/12-normal  . History of tobacco abuse   . HTN (hypertension)   . 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/06/2017    PAST SURGICAL HISTORY: Past Surgical History:  Procedure  Laterality Date  . CHOLECYSTECTOMY    . MELANOMA EXCISION     x5    FAMILY HISTORY: Family History  Problem Relation Age of Onset  . Congestive Heart Failure Mother   . Heart failure Mother   . Heart attack Father        age 3  . Heart Problems Brother   . Cancer Brother   . Cancer Brother        pancreatic   . Cancer Sister        breast   . Cancer Sister        breast  . Breast cancer Neg Hx     SOCIAL HISTORY: Social History   Socioeconomic History  . Marital status: Married    Spouse name: Marchia Bond  . Number of children: 2  . Years of education: 25  . Highest education level: Not on file  Occupational History  . Occupation: Retired  Scientific laboratory technician  . Financial resource strain: Not on file  . Food insecurity:    Worry: Not on file    Inability: Not on file  . Transportation needs:    Medical: Not on file    Non-medical: Not on file  Tobacco Use  . Smoking status: Former Smoker    Last attempt to quit: 02/25/1988    Years since quitting: 29.9  . Smokeless tobacco: Never Used  Substance and Sexual Activity  . Alcohol use: No  . Drug use: No  . Sexual activity: Never    Birth control/protection: Abstinence  Lifestyle  . Physical activity:    Days per week: Not on file    Minutes per session: Not on file  . Stress: Not on file  Relationships  . Social connections:    Talks on phone: Not on file    Gets together: Not on file    Attends religious service: Not on file    Active member of club or organization: Not on file    Attends meetings of clubs or organizations: Not on file    Relationship status: Not on file  . Intimate partner violence:    Fear of current or ex partner: Not on file    Emotionally abused: Not on file    Physically abused: Not on file    Forced sexual activity: Not on file  Other Topics Concern  . Not on file  Social History Narrative   Lives w/ husband and daughter   Caffeine use: 1 cup per day   Right handed      PHYSICAL  EXAM   Vitals:   02/04/18 1355  BP: (!) 159/71  Pulse: 88  Weight: 140 lb 4 oz (63.6 kg)  Height: 5\' 3"  (1.6 m)    Not recorded      Body mass index is 24.84 kg/m.  PHYSICAL EXAMNIATION:  Gen: NAD, conversant, well nourised, obese, well groomed  Cardiovascular: Regular rate rhythm, no peripheral edema, warm, nontender. Eyes: Conjunctivae clear without exudates or hemorrhage Neck: Supple, no carotid bruits. Pulmonary: Clear to auscultation bilaterally   NEUROLOGICAL EXAM:  MENTAL STATUS: Speech:    Speech is normal; fluent and spontaneous with normal comprehension.  Cognition:     Orientation to time, place and person     Normal recent and remote memory     Normal Attention span and concentration     Normal Language, naming, repeating,spontaneous speech     Fund of knowledge   CRANIAL NERVES: CN II: Visual fields are full to confrontation. Fundoscopic exam is normal with sharp discs and no vascular changes. Pupils are round equal and briskly reactive to light. CN III, IV, VI: extraocular movement are normal. No ptosis. CN V: Facial sensation is intact to pinprick in all 3 divisions bilaterally. Corneal responses are intact.  CN VII: Face is symmetric with normal eye closure and smile. CN VIII: Hearing is normal to rubbing fingers CN IX, X: Palate elevates symmetrically. Phonation is normal. CN XI: Head turning and shoulder shrug are intact CN XII: Tongue is midline with normal movements and no atrophy.  MOTOR: She has no muscle weakness, normal limb muscle tone, frequent yes yes head titubation, frequent grimace movement of her face, frequent eye blinking, posturing of oral facial movement, mild left shoulder elevation, mild right tilt,  REFLEXES: Reflexes are 2+ and symmetric at the biceps, triceps, knees, and ankles. Plantar responses are flexor.  SENSORY: Intact to light touch, pinprick, positional sensation and vibratory sensation are intact  in fingers and toes.  COORDINATION: Rapid alternating movements and fine finger movements are intact. There is no dysmetria on finger-to-nose and heel-knee-shin.    GAIT/STANCE: Posture is normal. Gait is steady with normal steps, base, arm swing, and turning. Heel and toe walking are normal. Tandem gait is normal.  Romberg is absent.   DIAGNOSTIC DATA (LABS, IMAGING, TESTING) - I reviewed patient records, labs, notes, testing and imaging myself where available.   ASSESSMENT AND PLAN  GIRTHA KILGORE is a 84 y.o. female   Meige Syndrome with blepharospasm   EMG guided BOTOX A injection, we used 100 units. Used 50 units discarded 50 units  Left orbicularis oculi at 2, 3, 4,6, 7, 10 oclock position (2.5 unitsx6=15 units) Right orbicularis oculi at 4,5,6, 7,8, 9, o'clock position (2.5 units x 6,= 15 units)   Left depressor labial anguli 2.5 units Right depressor labial anguli 2.5 units  Left platysmas 5 units  Right platysmas 5 units  Marcial Pacas, M.D. Ph.D.  [facility] [address], [place], [place] [address] Ph: [phone] Fax: [phone]  CC: Referring Provider

## 2018-02-09 ENCOUNTER — Ambulatory Visit: Payer: Medicare PPO | Admitting: Neurology

## 2018-04-26 ENCOUNTER — Telehealth: Payer: Self-pay | Admitting: Neurology

## 2018-04-26 NOTE — Telephone Encounter (Signed)
I called the patient insurance to check that she is still active and pre cert is still billable. Patient does not have an active plan with [employer] any longer, ref#[id number].   I called the patient to get current insurance information on both of her listed phone numbers but she did not answer so I left a VM asking her to call me back.

## 2018-05-03 NOTE — Telephone Encounter (Signed)
Patient is calling in wanting to reschedule her appt due to having a opthamaologist appt

## 2018-05-03 NOTE — Telephone Encounter (Signed)
I called the patient and canceled her apt she wants to call back at the end of the month to reschedule. Her new insurance is [facility] Medicare [id number].

## 2018-05-05 ENCOUNTER — Ambulatory Visit: Payer: Self-pay | Admitting: Neurology

## 2018-05-21 DIAGNOSIS — K5900 Constipation, unspecified: Secondary | ICD-10-CM | POA: Insufficient documentation

## 2018-05-21 DIAGNOSIS — I700 Atherosclerosis of aorta: Secondary | ICD-10-CM | POA: Insufficient documentation

## 2018-05-21 DIAGNOSIS — D649 Anemia, unspecified: Secondary | ICD-10-CM | POA: Insufficient documentation

## 2018-09-27 ENCOUNTER — Other Ambulatory Visit: Payer: Self-pay | Admitting: Internal Medicine

## 2018-09-27 DIAGNOSIS — Z1231 Encounter for screening mammogram for malignant neoplasm of breast: Secondary | ICD-10-CM

## 2018-11-18 ENCOUNTER — Ambulatory Visit
Admission: RE | Admit: 2018-11-18 | Discharge: 2018-11-18 | Disposition: A | Discharge status: Home / Self Care | Payer: Medicare Other | Source: Ambulatory Visit | Attending: Internal Medicine | Admitting: Internal Medicine

## 2018-11-18 ENCOUNTER — Other Ambulatory Visit: Payer: Self-pay

## 2018-11-18 DIAGNOSIS — Z1231 Encounter for screening mammogram for malignant neoplasm of breast: Secondary | ICD-10-CM

## 2019-04-16 ENCOUNTER — Ambulatory Visit: Payer: Medicare Other | Attending: Internal Medicine

## 2019-04-16 DIAGNOSIS — Z23 Encounter for immunization: Secondary | ICD-10-CM | POA: Insufficient documentation

## 2019-04-16 NOTE — Progress Notes (Signed)
   Covid-19 Vaccination Clinic  Name:  Kristi Montgomery    MRN: [id number] DOB: 15-Jan-1931  04/16/2019  [name] was observed post Covid-19 immunization for 15 minutes without incidence. She was provided with Vaccine Information Sheet and instruction to access the V-Safe system.   [name] was instructed to call 911 with any severe reactions post vaccine: Marland Kitchen Difficulty breathing  . Swelling of your face and throat  . A fast heartbeat  . A bad rash all over your body  . Dizziness and weakness    Immunizations Administered    Name Date Dose VIS Date Route   Pfizer COVID-19 Vaccine 04/16/2019  1:12 PM 0.3 mL 02/04/2019 Intramuscular   Manufacturer: [place]   Lot: [id number]   [place]: [id number]

## 2019-05-10 ENCOUNTER — Ambulatory Visit: Payer: Medicare Other

## 2019-05-10 ENCOUNTER — Ambulatory Visit: Payer: Medicare Other | Attending: Internal Medicine

## 2019-05-10 DIAGNOSIS — Z23 Encounter for immunization: Secondary | ICD-10-CM

## 2019-05-10 NOTE — Progress Notes (Signed)
   Covid-19 Vaccination Clinic  Name:  Kristi Montgomery    MRN: [id number] DOB: [birth year]  05/10/2019  Kristi Montgomery was observed post Covid-19 immunization for 15 minutes without incident. She was provided with Vaccine Information Sheet and instruction to access the V-Safe system.   Kristi Montgomery was instructed to call 911 with any severe reactions post vaccine: Marland Kitchen Difficulty breathing  . Swelling of face and throat  . A fast heartbeat  . A bad rash all over body  . Dizziness and weakness   Immunizations Administered    Name Date Dose VIS Date Route   Pfizer COVID-19 Vaccine 05/10/2019  1:58 PM 0.3 mL 02/04/2019 Intramuscular   Manufacturer: [place]   Lot: [id number]   [place]: [id number]

## 2019-07-22 ENCOUNTER — Ambulatory Visit: Payer: Medicare Other | Admitting: Podiatry

## 2019-09-09 ENCOUNTER — Ambulatory Visit (INDEPENDENT_AMBULATORY_CARE_PROVIDER_SITE_OTHER): Payer: Medicare Other | Admitting: Podiatry

## 2019-09-09 ENCOUNTER — Other Ambulatory Visit: Payer: Self-pay

## 2019-09-09 DIAGNOSIS — M79676 Pain in unspecified toe(s): Secondary | ICD-10-CM

## 2019-09-09 DIAGNOSIS — B351 Tinea unguium: Secondary | ICD-10-CM | POA: Diagnosis not present

## 2019-09-09 NOTE — Progress Notes (Signed)
  Subjective:  Patient ID: Kristi Montgomery, female    DOB: 07-Mar-1930,  MRN: [id number]  Chief Complaint  Patient presents with  . Nail Problem    Right 1st medial toenail possible ingrown  . Nail Problem    Nail trim 1-5 bilateral  . Peripheral Neuropathy    Pt wants treatment for neuropathy  . Nail Problem    Pt states "Do I have a fungus?"   86 y.o. female presents with the above complaint. History confirmed with patient.   Objective:  Physical Exam: warm, good capillary refill, nail exam nails show painful pincer nails bilateral, no trophic changes or ulcerative lesions. DP pulses palpable, PT pulses palpable and protective sensation intact Left Foot: normal exam, no swelling, tenderness, instability; ligaments intact, full range of motion of all ankle/foot joints  Right Foot: normal exam, no swelling, tenderness, instability; ligaments intact, full range of motion of all ankle/foot joints   No images are attached to the encounter.  Assessment:   1. Pain due to onychomycosis of nail    Plan:  Patient was evaluated and treated and all questions answered.  Onychodystrophy and Ingrown Nail -Nails palliatively debrided secondary to pain  Procedure: Nail Debridement Rationale: Patient meets criteria for routine foot care due to pain Type of Debridement: manual, sharp debridement. Instrumentation: Nail nipper, rotary burr. Number of Nails: 10  Return if symptoms worsen or fail to improve.

## 2019-10-13 ENCOUNTER — Other Ambulatory Visit: Payer: Self-pay | Admitting: Internal Medicine

## 2019-10-13 DIAGNOSIS — Z1231 Encounter for screening mammogram for malignant neoplasm of breast: Secondary | ICD-10-CM

## 2019-11-17 ENCOUNTER — Other Ambulatory Visit: Payer: Self-pay | Admitting: Internal Medicine

## 2019-11-17 DIAGNOSIS — M8580 Other specified disorders of bone density and structure, unspecified site: Secondary | ICD-10-CM

## 2019-11-22 ENCOUNTER — Ambulatory Visit
Admission: RE | Admit: 2019-11-22 | Discharge: 2019-11-22 | Disposition: A | Discharge status: Home / Self Care | Payer: Medicare Other | Source: Ambulatory Visit | Attending: Internal Medicine | Admitting: Internal Medicine

## 2019-11-22 ENCOUNTER — Other Ambulatory Visit: Payer: Self-pay

## 2019-11-22 DIAGNOSIS — Z1231 Encounter for screening mammogram for malignant neoplasm of breast: Secondary | ICD-10-CM

## 2019-11-25 ENCOUNTER — Other Ambulatory Visit: Payer: Self-pay | Admitting: Internal Medicine

## 2019-11-25 DIAGNOSIS — R928 Other abnormal and inconclusive findings on diagnostic imaging of breast: Secondary | ICD-10-CM

## 2019-12-09 ENCOUNTER — Ambulatory Visit
Admission: RE | Admit: 2019-12-09 | Discharge: 2019-12-09 | Disposition: A | Discharge status: Home / Self Care | Payer: Medicare Other | Source: Ambulatory Visit | Attending: Internal Medicine | Admitting: Internal Medicine

## 2019-12-09 ENCOUNTER — Other Ambulatory Visit: Payer: Self-pay

## 2019-12-09 ENCOUNTER — Other Ambulatory Visit: Payer: Self-pay | Admitting: Internal Medicine

## 2019-12-09 DIAGNOSIS — R921 Mammographic calcification found on diagnostic imaging of breast: Secondary | ICD-10-CM

## 2019-12-09 DIAGNOSIS — R928 Other abnormal and inconclusive findings on diagnostic imaging of breast: Secondary | ICD-10-CM

## 2019-12-21 ENCOUNTER — Other Ambulatory Visit: Payer: Self-pay

## 2019-12-21 ENCOUNTER — Ambulatory Visit
Admission: RE | Admit: 2019-12-21 | Discharge: 2019-12-21 | Disposition: A | Discharge status: Home / Self Care | Payer: Medicare Other | Source: Ambulatory Visit | Attending: Internal Medicine | Admitting: Internal Medicine

## 2019-12-21 DIAGNOSIS — R921 Mammographic calcification found on diagnostic imaging of breast: Secondary | ICD-10-CM

## 2020-02-03 ENCOUNTER — Encounter: Payer: Self-pay | Admitting: Podiatry

## 2020-02-03 ENCOUNTER — Other Ambulatory Visit: Payer: Self-pay

## 2020-02-03 ENCOUNTER — Ambulatory Visit (INDEPENDENT_AMBULATORY_CARE_PROVIDER_SITE_OTHER): Payer: Medicare Other | Admitting: Podiatry

## 2020-02-03 DIAGNOSIS — M79609 Pain in unspecified limb: Secondary | ICD-10-CM | POA: Diagnosis not present

## 2020-02-03 DIAGNOSIS — B351 Tinea unguium: Secondary | ICD-10-CM

## 2020-02-03 NOTE — Patient Instructions (Signed)
Recommended Shoe Brands:  S[employer] or any other shoes with a Memory Foam Foot bed  Inserts:  Dr. Felicie Morn gel inserts (available at pharmacies)

## 2020-02-03 NOTE — Progress Notes (Signed)
  Subjective:  Patient ID: Kristi Montgomery, female    DOB: June 20, 1930,  MRN: [id number]  Chief Complaint  Patient presents with  . Ingrown Toenail    86 y.o. female presents with the above complaint. History confirmed with patient. States her nails cause her pain when she takes steps. Complains of ingrowing areas of her big toenails.  Objective:  Physical Exam: warm, good capillary refill, nail exam onychomycosis of the toenails and pain to palpation, mildly ingrowing, no trophic changes or ulcerative lesions. DP pulses palpable, PT pulses palpable and protective sensation intact Left Foot: normal exam, no swelling, tenderness, instability; ligaments intact, full range of motion of all ankle/foot joints  Right Foot: normal exam, no swelling, tenderness, instability; ligaments intact, full range of motion of all ankle/foot joints   No images are attached to the encounter.  Assessment:   1. Pain due to onychomycosis of nail    Plan:  Patient was evaluated and treated and all questions answered.  Onychomycosis -Nails palliatively debrided secondary to pain  Procedure: Nail Debridement Type of Debridement: manual, sharp debridement. Instrumentation: Nail nipper, rotary burr. Number of Nails: 10  Return if symptoms worsen or fail to improve.

## 2020-06-13 ENCOUNTER — Other Ambulatory Visit: Payer: Self-pay | Admitting: Internal Medicine

## 2020-06-13 DIAGNOSIS — R921 Mammographic calcification found on diagnostic imaging of breast: Secondary | ICD-10-CM

## 2020-07-18 ENCOUNTER — Ambulatory Visit: Admission: RE | Admit: 2020-07-18 | Payer: Medicare Other | Source: Ambulatory Visit

## 2020-07-18 ENCOUNTER — Ambulatory Visit
Admission: RE | Admit: 2020-07-18 | Discharge: 2020-07-18 | Disposition: A | Discharge status: Home / Self Care | Payer: Medicare Other | Source: Ambulatory Visit | Attending: Internal Medicine | Admitting: Internal Medicine

## 2020-07-18 ENCOUNTER — Other Ambulatory Visit: Payer: Self-pay

## 2020-07-18 DIAGNOSIS — R921 Mammographic calcification found on diagnostic imaging of breast: Secondary | ICD-10-CM

## 2020-10-22 ENCOUNTER — Observation Stay (HOSPITAL_BASED_OUTPATIENT_CLINIC_OR_DEPARTMENT_OTHER)
Admission: EM | Admit: 2020-10-22 | Discharge: 2020-10-25 | Disposition: A | Discharge status: Home / Self Care | Payer: Medicare Other | Attending: Internal Medicine | Admitting: Internal Medicine

## 2020-10-22 ENCOUNTER — Emergency Department (HOSPITAL_BASED_OUTPATIENT_CLINIC_OR_DEPARTMENT_OTHER): Payer: Medicare Other | Admitting: Radiology

## 2020-10-22 ENCOUNTER — Other Ambulatory Visit: Payer: Self-pay

## 2020-10-22 ENCOUNTER — Encounter (HOSPITAL_BASED_OUTPATIENT_CLINIC_OR_DEPARTMENT_OTHER): Payer: Self-pay | Admitting: Obstetrics and Gynecology

## 2020-10-22 DIAGNOSIS — Z91040 Latex allergy status: Secondary | ICD-10-CM | POA: Diagnosis not present

## 2020-10-22 DIAGNOSIS — Z85820 Personal history of malignant melanoma of skin: Secondary | ICD-10-CM | POA: Insufficient documentation

## 2020-10-22 DIAGNOSIS — Z79899 Other long term (current) drug therapy: Secondary | ICD-10-CM | POA: Insufficient documentation

## 2020-10-22 DIAGNOSIS — Z87891 Personal history of nicotine dependence: Secondary | ICD-10-CM | POA: Insufficient documentation

## 2020-10-22 DIAGNOSIS — I10 Essential (primary) hypertension: Secondary | ICD-10-CM | POA: Insufficient documentation

## 2020-10-22 DIAGNOSIS — R531 Weakness: Secondary | ICD-10-CM | POA: Diagnosis present

## 2020-10-22 DIAGNOSIS — I4891 Unspecified atrial fibrillation: Principal | ICD-10-CM

## 2020-10-22 DIAGNOSIS — Z7901 Long term (current) use of anticoagulants: Secondary | ICD-10-CM | POA: Insufficient documentation

## 2020-10-22 DIAGNOSIS — I4819 Other persistent atrial fibrillation: Secondary | ICD-10-CM | POA: Diagnosis present

## 2020-10-22 LAB — CBC
HCT: 31.7 % — ABNORMAL LOW (ref 36.0–46.0)
Hemoglobin: 11.2 g/dL — ABNORMAL LOW (ref 12.0–15.0)
MCH: 36.4 pg — ABNORMAL HIGH (ref 26.0–34.0)
MCHC: 35.3 g/dL (ref 30.0–36.0)
MCV: 102.9 fL — ABNORMAL HIGH (ref 80.0–100.0)
Platelets: 294 10*3/uL (ref 150–400)
RBC: 3.08 MIL/uL — ABNORMAL LOW (ref 3.87–5.11)
RDW: 14.7 % (ref 11.5–15.5)
WBC: 4.8 10*3/uL (ref 4.0–10.5)
nRBC: 0 % (ref 0.0–0.2)

## 2020-10-22 LAB — TROPONIN I (HIGH SENSITIVITY)
Troponin I (High Sensitivity): 3 ng/L (ref <18)
Troponin I (High Sensitivity): 3 ng/L (ref <18)

## 2020-10-22 LAB — BASIC METABOLIC PANEL
Anion gap: 11 (ref 5–15)
BUN: 10 mg/dL (ref 8–23)
CO2: 24 mmol/L (ref 22–32)
Calcium: 9.1 mg/dL (ref 8.9–10.3)
Chloride: 99 mmol/L (ref 98–111)
Creatinine, Ser: 0.69 mg/dL (ref 0.44–1.00)
GFR, Estimated: >60 mL/min (ref >60)
Glucose, Bld: 126 mg/dL — ABNORMAL HIGH (ref 70–99)
Potassium: 3.7 mmol/L (ref 3.5–5.1)
Sodium: 134 mmol/L — ABNORMAL LOW (ref 135–145)

## 2020-10-22 LAB — D-DIMER, QUANTITATIVE: D-Dimer, Quant: 0.38 ug/mL-FEU (ref 0.00–0.50)

## 2020-10-22 MED ORDER — METOPROLOL TARTRATE 5 MG/5ML IV SOLN
2.5000 mg | Freq: Once | INTRAVENOUS | Status: AC
  Administered 2020-10-22: 2.5 mg via INTRAVENOUS
  Filled 2020-10-22: qty 5

## 2020-10-22 NOTE — ED Triage Notes (Signed)
Patient reports to the ER for weakness and ShOB. Patient reports her HR was 119. Patient reports her BP was 135/61. Patient reports discomfort in her chest with the ShOB.

## 2020-10-22 NOTE — ED Provider Notes (Signed)
[facility] EMERGENCY DEPT Provider Note   CSN: [id number] Arrival date & time: 10/22/20  1726     History Chief Complaint  Patient presents with   Weakness    Kristi Montgomery is a 87 y.o. female.   Weakness Associated symptoms: shortness of breath   Associated symptoms: no abdominal pain and no fever   Patient presents with generalized weakness and shortness of breath.  Feels her heart going fast at times.  States she is lost much of her exertion ability.  Around 2 weeks ago was positive for COVID.  States she been doing pretty well overall but has lost a lot of energy.  Sometimes feels her heart going fast.  Worse last couple days.  No fevers or chills.  No swelling in her legs.  No history of atrial fibrillation.  No hemoptysis.  No real chest pain.    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/06/2017   Bilateral dry eyes 07/13/2014   Essential hypertension 07/29/2012   Choroidal nevus 07/10/2011   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   Nuclear cataract 07/10/2011   CHEST PAIN UNSPECIFIED 01/25/2009   DYSPHAGIA UNSPECIFIED 01/25/2009    Past Surgical History:  Procedure Laterality Date   CHOLECYSTECTOMY     MELANOMA EXCISION     x5     OB History     Gravida      Para      Term      Preterm      AB      Living  2      SAB      IAB      Ectopic      Multiple      Live Births              Family History  Problem Relation Age of  Onset   Congestive Heart Failure Mother    Heart failure Mother    Heart attack Father        age 42   Heart Problems Brother    Cancer Brother    Cancer Brother        pancreatic    Cancer Sister        breast    Cancer Sister        breast   Breast cancer Neg Hx     Social History   Tobacco Use   Smoking status: Former    Types: Cigarettes    Quit date: 02/25/1988    Years since quitting: 32.6   Smokeless tobacco: Never  Vaping Use   Vaping Use: Never used  Substance Use Topics   Alcohol use: No   Drug use: No    Home Medications Prior to Admission medications   Medication Sig Start Date End Date Taking? Authorizing Provider  ALPRAZolam (XANAX) 0.25 MG tablet Take 0.125 mg by mouth 2 (two) times daily. Patient not taking: Reported on 09/09/2019 12/09/17   [provider]  amLODipine (NORVASC) 10 MG tablet  03/21/19   [provider]  memantine (NAMENDA) 5 MG tablet Take 1 tablet daily for one week, then take 1 tablet twice daily for one week, then take 1 tablet in the morning and 2 in the evening for one week, then take 2 tablets twice daily 12/09/17   Kathrynn Ducking, MD  metoprolol succinate (TOPROL-XL) 25 MG 24 hr tablet Take 25 mg by mouth daily. 08/20/19   [provider]  Multiple Vitamins-Minerals (PRESERVISION AREDS 2 PO) Take 1 Dose by mouth daily.    [provider]  Polyethyl Glycol-Propyl Glycol (SYSTANE OP) Apply 1 Dose to eye as needed.    [provider]  spironolactone (ALDACTONE) 25 MG tablet Take by mouth. 09/05/19   [provider]  telmisartan (MICARDIS) 80 MG tablet Take 80 mg by mouth daily. 09/05/19   [provider]  triamcinolone cream (KENALOG) 0.5 % Apply topically. 05/17/19   [provider]  triamterene-hydrochlorothiazide (MAXZIDE-25) 37.5-25 MG per tablet Take 1 tablet by mouth daily.    [provider]  valACYclovir (VALTREX) 1000 MG tablet Take 1,000 mg by mouth  daily. 05/20/19   [provider]  verapamil (COVERA HS) 240 MG (CO) 24 hr tablet Take 240 mg by mouth at bedtime.    [provider]  zolpidem (AMBIEN) 10 MG tablet Take 5 mg by mouth at bedtime as needed for sleep. Take 0.5 tablet at bedtime as needed for sleep.    [provider]    Allergies    Amoxicillin, Codeine, Gabapentin, and Latex  Review of Systems   Review of Systems  Constitutional:  Positive for fatigue. Negative for appetite change and fever.  HENT:  Negative for congestion.   Respiratory:  Positive for shortness of breath.   Cardiovascular:  Negative for leg swelling.  Gastrointestinal:  Negative for abdominal pain.  Musculoskeletal:  Negative for back pain.  Skin:  Negative for rash.  Neurological:  Positive for weakness.  Psychiatric/Behavioral:  Negative for confusion.    Physical Exam Updated Vital Signs BP 124/70   Pulse 72   Temp 98.6 F (37 C)   Resp (!) 23   SpO2 100%   Physical Exam Vitals and nursing note reviewed.  HENT:     Head: Normocephalic.  Eyes:     Pupils: Pupils are equal, round, and reactive to light.  Cardiovascular:     Rate and Rhythm: Tachycardia present. Rhythm irregular.  Pulmonary:     Breath sounds: No wheezing or rhonchi.  Abdominal:     Tenderness: There is no abdominal tenderness.  Musculoskeletal:     Cervical back: Neck supple.     Comments: Mild edema bilateral lower extremities.  Skin:    General: Skin is warm.  Neurological:     Mental Status: She is alert and oriented to person, place, and time.    ED Results / Procedures / Treatments   Labs (all labs ordered are listed, but only abnormal results are displayed) Labs Reviewed  BASIC METABOLIC PANEL - Abnormal; Notable for the following components:      Result Value   Sodium 134 (*)    Glucose, Bld 126 (*)    All other components within normal limits  CBC - Abnormal; Notable for the following components:   RBC 3.08 (*)     Hemoglobin 11.2 (*)    HCT 31.7 (*)    MCV 102.9 (*)    MCH 36.4 (*)  All other components within normal limits  D-DIMER, QUANTITATIVE  TROPONIN I (HIGH SENSITIVITY)  TROPONIN I (HIGH SENSITIVITY)    EKG EKG Interpretation  Date/Time:  Monday October 22 2020 17:41:04 EDT Ventricular Rate:  113 PR Interval:    QRS Duration: 68 QT Interval:  324 QTC Calculation: 444 R Axis:   77 Text Interpretation: Atrial fibrillation with rapid ventricular response Low voltage QRS Abnormal ECG Confirmed by Davonna Belling [phone]) on 10/22/2020 10:14:59 PM  Radiology DG Chest 2 View  Result Date: 10/22/2020 CLINICAL DATA:  Weakness and shortness of breath. EXAM: CHEST - 2 VIEW COMPARISON:  January 05, 2018 FINDINGS: There is no evidence of acute infiltrate, pleural effusion or pneumothorax. The heart size and mediastinal contours are within normal limits. There is marked severity calcification of the aortic arch. Radiopaque surgical clips are seen overlying the right upper quadrant. The visualized skeletal structures are unremarkable. IMPRESSION: No active cardiopulmonary disease. Electronically Signed   By: Virgina Norfolk M.D.   On: 10/22/2020 18:32    Procedures Procedures   Medications Ordered in ED Medications  metoprolol tartrate (LOPRESSOR) injection 2.5 mg (has no administration in time range)    ED Course  I have reviewed the triage vital signs and the nursing notes.  Pertinent labs & imaging results that were available during my care of the patient were reviewed by me and considered in my medical decision making (see chart for details).    MDM Rules/Calculators/A&P                           Patient presents with shortness of breath and fatigue.  Recent COVID infection a couple weeks ago.  Found to be in A. fib.  Rate goes from around 100 at best up to around 150.  CHA2DS2-VASc score of 4.  Not on anticoagulation.  Has felt worse the last day but can not say that the A. fib  necessarily started at that time so I do not think she is a good ED cardioversion patient.  Is on metoprolol at baseline.  Chest x-ray reassuring.  D-dimer done to help rule out pulmonary embolism in the setting of recent COVID.  It was negative.  I think she is low enough risk for pulmonary embolism that we do not need CT scan.  However with the fast heart rate particularly with any exertion or even speaking going up to 150 I feels that she needs more urgent decreasing of the rate.  We will give some IV metoprolol.  I think she will require admission in the hospital.  Will discuss with hospitalist.  CRITICAL CARE Performed by: Davonna Belling Total critical care time: 30 minutes Critical care time was exclusive of separately billable procedures and treating other patients. Critical care was necessary to treat or prevent imminent or life-threatening deterioration. Critical care was time spent personally by me on the following activities: development of treatment plan with patient and/or surrogate as well as nursing, discussions with consultants, evaluation of patient's response to treatment, examination of patient, obtaining history from patient or surrogate, ordering and performing treatments and interventions, ordering and review of laboratory studies, ordering and review of radiographic studies, pulse oximetry and re-evaluation of patient's condition.  Final Clinical Impression(s) / ED Diagnoses Final diagnoses:  Atrial fibrillation with rapid ventricular response [facility])    Rx / DC Orders ED Discharge Orders     None        Davonna Belling, MD 10/22/20  2342  

## 2020-10-23 DIAGNOSIS — I4891 Unspecified atrial fibrillation: Secondary | ICD-10-CM

## 2020-10-23 DIAGNOSIS — Z87891 Personal history of nicotine dependence: Secondary | ICD-10-CM | POA: Diagnosis not present

## 2020-10-23 DIAGNOSIS — Z85820 Personal history of malignant melanoma of skin: Secondary | ICD-10-CM | POA: Diagnosis not present

## 2020-10-23 DIAGNOSIS — R531 Weakness: Secondary | ICD-10-CM | POA: Diagnosis present

## 2020-10-23 DIAGNOSIS — I10 Essential (primary) hypertension: Secondary | ICD-10-CM | POA: Diagnosis not present

## 2020-10-23 DIAGNOSIS — Z7901 Long term (current) use of anticoagulants: Secondary | ICD-10-CM | POA: Diagnosis not present

## 2020-10-23 DIAGNOSIS — Z79899 Other long term (current) drug therapy: Secondary | ICD-10-CM | POA: Diagnosis not present

## 2020-10-23 DIAGNOSIS — Z91040 Latex allergy status: Secondary | ICD-10-CM | POA: Diagnosis not present

## 2020-10-23 LAB — BASIC METABOLIC PANEL
Anion gap: 6 (ref 5–15)
BUN: 6 mg/dL — ABNORMAL LOW (ref 8–23)
CO2: 24 mmol/L (ref 22–32)
Calcium: 8.7 mg/dL — ABNORMAL LOW (ref 8.9–10.3)
Chloride: 106 mmol/L (ref 98–111)
Creatinine, Ser: 0.65 mg/dL (ref 0.44–1.00)
GFR, Estimated: >60 mL/min (ref >60)
Glucose, Bld: 162 mg/dL — ABNORMAL HIGH (ref 70–99)
Potassium: 3.3 mmol/L — ABNORMAL LOW (ref 3.5–5.1)
Sodium: 136 mmol/L (ref 135–145)

## 2020-10-23 LAB — TSH: TSH: 1.149 u[IU]/mL (ref 0.350–4.500)

## 2020-10-23 LAB — MAGNESIUM: Magnesium: 2 mg/dL (ref 1.7–2.4)

## 2020-10-23 LAB — PHOSPHORUS: Phosphorus: 2.9 mg/dL (ref 2.5–4.6)

## 2020-10-23 MED ORDER — VITAMIN B-12 1000 MCG PO TABS
1000.0000 ug | ORAL_TABLET | Freq: Every day | ORAL | Status: DC
  Administered 2020-10-23 – 2020-10-25 (×3): 1000 ug via ORAL
  Filled 2020-10-23 (×3): qty 1

## 2020-10-23 MED ORDER — HEPARIN (PORCINE) 25000 UT/250ML-% IV SOLN
850.0000 [IU]/h | INTRAVENOUS | Status: DC
  Administered 2020-10-23: 850 [IU]/h via INTRAVENOUS
  Filled 2020-10-23: qty 250

## 2020-10-23 MED ORDER — HEPARIN BOLUS VIA INFUSION
3000.0000 [IU] | Freq: Once | INTRAVENOUS | Status: AC
  Administered 2020-10-23: 3000 [IU] via INTRAVENOUS
  Filled 2020-10-23: qty 3000

## 2020-10-23 MED ORDER — ACETAMINOPHEN 650 MG RE SUPP: 650.0000 mg | Freq: Four times a day (QID) | RECTAL | Status: DC | PRN

## 2020-10-23 MED ORDER — IRBESARTAN 75 MG PO TABS
75.0000 mg | ORAL_TABLET | Freq: Every day | ORAL | Status: DC
  Administered 2020-10-23 – 2020-10-24 (×2): 75 mg via ORAL
  Filled 2020-10-23 (×2): qty 1

## 2020-10-23 MED ORDER — ONDANSETRON HCL 4 MG/2ML IJ SOLN: 4.0000 mg | Freq: Four times a day (QID) | INTRAMUSCULAR | Status: DC | PRN

## 2020-10-23 MED ORDER — ONDANSETRON HCL 4 MG PO TABS: 4.0000 mg | ORAL_TABLET | Freq: Four times a day (QID) | ORAL | Status: DC | PRN

## 2020-10-23 MED ORDER — ALPRAZOLAM 0.25 MG PO TABS
0.1250 mg | ORAL_TABLET | Freq: Two times a day (BID) | ORAL | Status: DC | PRN
  Administered 2020-10-24 (×2): 0.125 mg via ORAL
  Filled 2020-10-23 (×2): qty 1

## 2020-10-23 MED ORDER — ACETAMINOPHEN 325 MG PO TABS: 650.0000 mg | ORAL_TABLET | Freq: Four times a day (QID) | ORAL | Status: DC | PRN

## 2020-10-23 MED ORDER — METOPROLOL TARTRATE 5 MG/5ML IV SOLN: 2.5000 mg | Freq: Four times a day (QID) | INTRAVENOUS | Status: DC | PRN

## 2020-10-23 MED ORDER — FERROUS SULFATE 325 (65 FE) MG PO TABS
325.0000 mg | ORAL_TABLET | Freq: Every day | ORAL | Status: DC
  Administered 2020-10-24 – 2020-10-25 (×2): 325 mg via ORAL
  Filled 2020-10-23 (×2): qty 1

## 2020-10-23 MED ORDER — METOPROLOL TARTRATE 25 MG PO TABS
25.0000 mg | ORAL_TABLET | Freq: Two times a day (BID) | ORAL | Status: DC
  Administered 2020-10-23 – 2020-10-24 (×2): 25 mg via ORAL
  Filled 2020-10-23 (×2): qty 1

## 2020-10-23 MED ORDER — POLYVINYL ALCOHOL 1.4 % OP SOLN: Freq: Every day | OPHTHALMIC | Status: DC | PRN

## 2020-10-23 MED ORDER — PANTOPRAZOLE SODIUM 40 MG PO TBEC
40.0000 mg | DELAYED_RELEASE_TABLET | Freq: Every day | ORAL | Status: DC
  Administered 2020-10-23 – 2020-10-25 (×3): 40 mg via ORAL
  Filled 2020-10-23 (×3): qty 1

## 2020-10-23 NOTE — H&P (Signed)
History and Physical    Kristi Montgomery [id number] DOB: 1930/07/14 DOA: 10/22/2020  PCP: Shon Baton, MD (Confirm with patient/family/NH records and if not entered, this has to be entered at [facility] point of entry) Patient coming from: Home  I have personally briefly reviewed patient's old medical records in [place]  Chief Complaint: Palpitations  HPI: Kristi Montgomery is a 87 y.o. female with medical history significant of HTN, anxiety, chronic iron deficiency anemia, GERD on PPI, questionable history of PAF, presented with new onset of palpitations.  2 weeks ago, patient had COVID infection, her symptoms involved generalized weakness, fatigue.  She recovered from that.  This time, her symptoms started 2 days ago, with strong feeling of palpitations and shortness of breath while walking, no chest pain no cough no fever or chills.  She went to be tested for COVID on Saturday, which came back negative.  She has no history of stroke, no history of CHF, and no history of GI bleed or peptic ulcers, does remember she was told once or 2 times about irregular heartbeat before, but unsure about history of Intervention.  ED Course: At [facility] ED, patient was found to be 3 rapid A. fib, 1 dose of IV Lopressor 2.5 mg given, and her heart rate has come down.  However, he went back into uncontrolled A. Fib this morning.  Review of Systems: As per HPI otherwise 14 point review of systems negative.    Past Medical History:  Diagnosis Date   Anxiety    Cancer ([place])    Melanoma   Cataract    Chest pain    myoview 04/01/12-normal   History of tobacco abuse    HTN (hypertension)    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/06/2017    Past Surgical History:  Procedure Laterality Date   CHOLECYSTECTOMY     MELANOMA EXCISION     x5     reports that she quit smoking about 32 years ago. Her smoking use included cigarettes. She has never used smokeless tobacco. She reports that she does not drink alcohol and  does not use drugs.  Allergies  Allergen Reactions   Amoxicillin Other (See Comments)    Tongue swelling  Has patient had a PCN reaction causing immediate rash, facial/tongue/throat swelling, SOB or lightheadedness with hypotension: Unknown Has patient had a PCN reaction causing severe rash involving mucus membranes or skin necrosis: Unknown Has patient had a PCN reaction that required hospitalization: Unknown Has patient had a PCN reaction occurring within the last 10 years: Unknown If all of the above answers are "NO", then may proceed with Cephalosporin use.    Codeine Other (See Comments)   Latex Rash    band aids, tape    Family History  Problem Relation Age of Onset   Congestive Heart Failure Mother    Heart failure Mother    Heart attack Father        age 87   Heart Problems Brother    Cancer Brother    Cancer Brother        pancreatic    Cancer Sister        breast    Cancer Sister        breast   Breast cancer Neg Hx      Prior to Admission medications   Medication Sig Start Date End Date Taking? Authorizing Provider  ALPRAZolam (XANAX) 0.25 MG tablet Take 0.125 mg by mouth 2 (two) times daily as needed for anxiety. 12/09/17  Yes [provider]  amLODipine (NORVASC) 10 MG tablet Take 10 mg by mouth at bedtime. 03/21/19  Yes [provider]  ferrous sulfate 325 (65 FE) MG tablet Take 325 mg by mouth daily with breakfast.   Yes [provider]  omeprazole (PRILOSEC) 40 MG capsule Take 40 mg by mouth daily as needed (heartburn).   Yes [provider]  Polyethyl Glycol-Propyl Glycol (SYSTANE OP) Place 1 Dose into both eyes daily as needed (dry eyes).   Yes [provider]  telmisartan (MICARDIS) 80 MG tablet Take 80 mg by mouth daily. 09/05/19  Yes [provider]  triamcinolone cream (KENALOG) 0.5 % Apply 1 application topically 2 (two) times daily as needed (rash). 05/17/19  Yes [provider]  vitamin  B-12 (CYANOCOBALAMIN) 1000 MCG tablet Take 1,000 mcg by mouth daily.   Yes [provider]  memantine (NAMENDA) 5 MG tablet Take 1 tablet daily for one week, then take 1 tablet twice daily for one week, then take 1 tablet in the morning and 2 in the evening for one week, then take 2 tablets twice daily 12/09/17   Kathrynn Ducking, MD  spironolactone (ALDACTONE) 25 MG tablet Take 25 mg by mouth daily. 09/05/19   [provider]    Physical Exam: Vitals:   10/23/20 0915 10/23/20 0925 10/23/20 1200 10/23/20 1422  BP:   130/87 (!) 131/59  Pulse: (!) 109  (!) 124 70  Resp: 16  (!) 3 12  Temp:  97.7 F (36.5 C)  99.2 F (37.3 C)  TempSrc:  Oral  Oral  SpO2: 99%  95% 99%    Constitutional: NAD, calm, comfortable Vitals:   10/23/20 0915 10/23/20 0925 10/23/20 1200 10/23/20 1422  BP:   130/87 (!) 131/59  Pulse: (!) 109  (!) 124 70  Resp: 16  (!) 3 12  Temp:  97.7 F (36.5 C)  99.2 F (37.3 C)  TempSrc:  Oral  Oral  SpO2: 99%  95% 99%   Eyes: PERRL, lids and conjunctivae normal ENMT: Mucous membranes are moist. Posterior pharynx clear of any exudate or lesions.Normal dentition.  Neck: normal, supple, no masses, no thyromegaly Respiratory: clear to auscultation bilaterally, no wheezing, no crackles. Normal respiratory effort. No accessory muscle use.  Cardiovascular: Irregular heart rate, no murmurs / rubs / gallops. No extremity edema. 2+ pedal pulses. No carotid bruits.  Abdomen: no tenderness, no masses palpated. No hepatosplenomegaly. Bowel sounds positive.  Musculoskeletal: no clubbing / cyanosis. No joint deformity upper and lower extremities. Good ROM, no contractures. Normal muscle tone.  Skin: no rashes, lesions, ulcers. No induration Neurologic: CN 2-12 grossly intact. Sensation intact, DTR normal. Strength 5/5 in all 4.  Psychiatric: Normal judgment and insight. Alert and oriented x 3. Normal mood.     Labs on Admission: I have personally reviewed  following labs and imaging studies  CBC: Recent Labs  Lab 10/22/20 1744  WBC 4.8  HGB 11.2*  HCT 31.7*  MCV 102.9*  PLT [id number]   Basic Metabolic Panel: Recent Labs  Lab 10/22/20 1744  NA 134*  K 3.7  CL 99  CO2 24  GLUCOSE 126*  BUN 10  CREATININE 0.69  CALCIUM 9.1   GFR: CrCl cannot be calculated (Unknown ideal weight.). Liver Function Tests: No results for input(s): AST, ALT, ALKPHOS, BILITOT, PROT, ALBUMIN in the last 168 hours. No results for input(s): LIPASE, AMYLASE in the last 168 hours. No results for input(s): AMMONIA in the last 168  hours. Coagulation Profile: No results for input(s): INR, PROTIME in the last 168 hours. Cardiac Enzymes: No results for input(s): CKTOTAL, CKMB, CKMBINDEX, TROPONINI in the last 168 hours. BNP (last 3 results) No results for input(s): PROBNP in the last 8760 hours. HbA1C: No results for input(s): HGBA1C in the last 72 hours. CBG: No results for input(s): GLUCAP in the last 168 hours. Lipid Profile: No results for input(s): CHOL, HDL, LDLCALC, TRIG, CHOLHDL, LDLDIRECT in the last 72 hours. Thyroid Function Tests: No results for input(s): TSH, T4TOTAL, FREET4, T3FREE, THYROIDAB in the last 72 hours. Anemia Panel: No results for input(s): VITAMINB12, FOLATE, FERRITIN, TIBC, IRON, RETICCTPCT in the last 72 hours. Urine analysis:    Component Value Date/Time   COLORURINE COLORLESS (A) 01/05/2018 1257   APPEARANCEUR CLEAR 01/05/2018 1257   LABSPEC 1.004 (L) 01/05/2018 1257   PHURINE 8.0 01/05/2018 1257   GLUCOSEU NEGATIVE 01/05/2018 1257   HGBUR NEGATIVE 01/05/2018 1257   BILIRUBINUR NEGATIVE 01/05/2018 1257   KETONESUR NEGATIVE 01/05/2018 1257   PROTEINUR NEGATIVE 01/05/2018 1257   NITRITE NEGATIVE 01/05/2018 1257   LEUKOCYTESUR NEGATIVE 01/05/2018 1257    Radiological Exams on Admission: DG Chest 2 View  Result Date: 10/22/2020 CLINICAL DATA:  Weakness and shortness of breath. EXAM: CHEST - 2 VIEW COMPARISON:  January 05, 2018 FINDINGS: There is no evidence of acute infiltrate, pleural effusion or pneumothorax. The heart size and mediastinal contours are within normal limits. There is marked severity calcification of the aortic arch. Radiopaque surgical clips are seen overlying the right upper quadrant. The visualized skeletal structures are unremarkable. IMPRESSION: No active cardiopulmonary disease. Electronically Signed   By: Virgina Norfolk M.D.   On: 10/22/2020 18:32    EKG: Independently reviewed. afib  Assessment/Plan Active Problems:   New onset atrial fibrillation (HCC)   A-fib (HCC)  (please populate well all problems here in Problem List. (For example, if patient is on BP meds at home and you resume or decide to hold them, it is a problem that needs to be her. Same for CAD, COPD, HLD and so on)  PAF vs new onset of afib -Seemed that patient HR responded to IV push of lopressor given on 23:44 at [facility] ED, but no following dose of PO metoprolol given since. So, will try combination of PO metoprolol plus PRN lopressor regimen -CHADS2=2, systemic anticoagulation indicated. Patient appears to have a chronic iron deficiency anemia, he H/H has been stable at least since 2019, she never had EGD or Colonoscope. But she never had major bleeding, or ever needed blood transfusion. I discussed the benefit and risks of systemic anticoagulation to patient and her daughter at bedside, both agreed with go ahead with anticoagulation, and outpatient GI for colonoscope. -Heparin drip for now, expect patient can be discharged on Eliquis. -FOBT x3. -TSH, check magnesium phosphorus level -Echo in AM when HR more controlled.  HTN -Cut down ARB, hold off amlodipine and Aldactone while on metoprolol.  Chronic iron deficiency anemia -Continue iron supplement  GERD -PPI  DVT prophylaxis: Heparin drip Code Status: Full code Family Communication: Daughter at bedside Disposition Plan: Expect less than 2 midnight  hospital stay Consults called: None Admission status: Tele obs   Lequita Halt MD [facility] Pager [phone]  10/23/2020, 3:11 PM

## 2020-10-23 NOTE — ED Notes (Signed)
Patient states IV is hurting. This nurse checked IV. IV is patent, no redness, swelling or leaking from site. Bandage is in place, clean and dry. Patient and patients family made aware that if nurse removes IV, patient will need to be stuck again for new IV access.

## 2020-10-23 NOTE — Progress Notes (Signed)
HR more controlled. Ranging 70s-90s, continue current PO plus IV PRN metoprolol regimen.

## 2020-10-23 NOTE — Progress Notes (Signed)
ANTICOAGULATION CONSULT NOTE - Initial Consult  Pharmacy Consult for heparin Indication: atrial fibrillation  Allergies  Allergen Reactions   Amoxicillin Other (See Comments)    Tongue swelling  Has patient had a PCN reaction causing immediate rash, facial/tongue/throat swelling, SOB or lightheadedness with hypotension: Unknown Has patient had a PCN reaction causing severe rash involving mucus membranes or skin necrosis: Unknown Has patient had a PCN reaction that required hospitalization: Unknown Has patient had a PCN reaction occurring within the last 10 years: Unknown If all of the above answers are "NO", then may proceed with Cephalosporin use.    Codeine Other (See Comments)   Latex Rash    band aids, tape    Patient Measurements: Height: '5\' 3"'$  (160 cm) Weight: 59 kg (130 lb) (per pt) IBW/kg (Calculated) : 52.4 Heparin Dosing Weight: TBW  Vital Signs: Temp: 99.2 F (37.3 C) (08/30 1422) Temp Source: Oral (08/30 1422) BP: 131/59 (08/30 1422) Pulse Rate: 70 (08/30 1422)  Labs: Recent Labs    10/22/20 1744 10/22/20 2114  HGB 11.2*  --   HCT 31.7*  --   PLT 294  --   CREATININE 0.69  --   TROPONINIHS 3 3    Estimated Creatinine Clearance: 40.2 mL/min (by C-G formula based on SCr of 0.69 mg/dL).   Medical History: Past Medical History:  Diagnosis Date   Anxiety    Cancer ([place])    Melanoma   Cataract    Chest pain    myoview 04/01/12-normal   History of tobacco abuse    HTN (hypertension)    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/06/2017    Medications:  Medications Prior to Admission  Medication Sig Dispense Refill Last Dose   ALPRAZolam (XANAX) 0.25 MG tablet Take 0.125 mg by mouth 2 (two) times daily as needed for anxiety.   10/22/2020   amLODipine (NORVASC) 10 MG tablet Take 10 mg by mouth at bedtime.   10/21/2020   ferrous sulfate 325 (65 FE) MG tablet Take 325 mg by mouth daily with breakfast.   10/22/2020   omeprazole (PRILOSEC) 40 MG capsule Take 40 mg by mouth daily  as needed (heartburn).   unk   Polyethyl Glycol-Propyl Glycol (SYSTANE OP) Place 1 Dose into both eyes daily as needed (dry eyes).   10/22/2020   telmisartan (MICARDIS) 80 MG tablet Take 80 mg by mouth daily.   10/22/2020   triamcinolone cream (KENALOG) 0.5 % Apply 1 application topically 2 (two) times daily as needed (rash).   unk   vitamin B-12 (CYANOCOBALAMIN) 1000 MCG tablet Take 1,000 mcg by mouth daily.   10/22/2020   memantine (NAMENDA) 5 MG tablet Take 1 tablet daily for one week, then take 1 tablet twice daily for one week, then take 1 tablet in the morning and 2 in the evening for one week, then take 2 tablets twice daily 70 tablet 0    spironolactone (ALDACTONE) 25 MG tablet Take 25 mg by mouth daily.   unk    Assessment: 53 yof who presented to the ED today with SOB and fatigue found to be in Afib. She has a recent COVID infection. No anticoagulation PTA noted. Pharmacy consulted to begin IV heparin. Renal function is normal. No bleeding noted, Hgb low 11.2 and platelets are normal.  Goal of Therapy:  Heparin level 0.3-0.7 units/ml Monitor platelets by anticoagulation protocol: Yes   Plan:  Heparin 3000 units IV bolus then infuse at 850 units/hr 8h heparin level Daily heparin level, CBC Monitor for s/sx  of bleeding  Thank you for involving pharmacy in this patient's care.  Renold Genta, PharmD, BCPS Clinical Pharmacist Clinical phone for 10/23/2020 until 10p is [phone] 10/23/2020 3:36 PM  **Pharmacist phone directory can be found on [email or web address] listed under [place]**

## 2020-10-24 ENCOUNTER — Other Ambulatory Visit (HOSPITAL_COMMUNITY): Payer: Self-pay

## 2020-10-24 ENCOUNTER — Observation Stay (HOSPITAL_BASED_OUTPATIENT_CLINIC_OR_DEPARTMENT_OTHER): Payer: Medicare Other

## 2020-10-24 DIAGNOSIS — I4891 Unspecified atrial fibrillation: Principal | ICD-10-CM

## 2020-10-24 LAB — ECHOCARDIOGRAM COMPLETE
Area-P 1/2: 2.88 cm2
Calc EF: 66 %
Height: 63 in
MV VTI: 0.85 cm2
S' Lateral: 2.2 cm
Single Plane A2C EF: 69.6 %
Single Plane A4C EF: 63.9 %
Weight: 2080 oz

## 2020-10-24 LAB — HEPARIN LEVEL (UNFRACTIONATED): Heparin Unfractionated: 0.36 IU/mL (ref 0.30–0.70)

## 2020-10-24 LAB — CBC
HCT: 31.3 % — ABNORMAL LOW (ref 36.0–46.0)
Hemoglobin: 10.8 g/dL — ABNORMAL LOW (ref 12.0–15.0)
MCH: 36.1 pg — ABNORMAL HIGH (ref 26.0–34.0)
MCHC: 34.5 g/dL (ref 30.0–36.0)
MCV: 104.7 fL — ABNORMAL HIGH (ref 80.0–100.0)
Platelets: 317 10*3/uL (ref 150–400)
RBC: 2.99 MIL/uL — ABNORMAL LOW (ref 3.87–5.11)
RDW: 14.5 % (ref 11.5–15.5)
WBC: 5 10*3/uL (ref 4.0–10.5)
nRBC: 0 % (ref 0.0–0.2)

## 2020-10-24 LAB — HEMOGLOBIN A1C
Hgb A1c MFr Bld: 5.6 % (ref 4.8–5.6)
Mean Plasma Glucose: 114 mg/dL

## 2020-10-24 MED ORDER — LOPERAMIDE HCL 2 MG PO CAPS: 2.0000 mg | ORAL_CAPSULE | Freq: Three times a day (TID) | ORAL | Status: DC | PRN

## 2020-10-24 MED ORDER — POTASSIUM CHLORIDE CRYS ER 20 MEQ PO TBCR
40.0000 meq | EXTENDED_RELEASE_TABLET | Freq: Once | ORAL | Status: AC
  Administered 2020-10-24: 40 meq via ORAL
  Filled 2020-10-24: qty 2

## 2020-10-24 MED ORDER — APIXABAN 2.5 MG PO TABS
2.5000 mg | ORAL_TABLET | Freq: Two times a day (BID) | ORAL | Status: DC
  Administered 2020-10-24 – 2020-10-25 (×2): 2.5 mg via ORAL
  Filled 2020-10-24 (×2): qty 1

## 2020-10-24 MED ORDER — METOPROLOL TARTRATE 50 MG PO TABS
50.0000 mg | ORAL_TABLET | Freq: Two times a day (BID) | ORAL | Status: DC
  Administered 2020-10-24 – 2020-10-25 (×2): 50 mg via ORAL
  Filled 2020-10-24 (×2): qty 1

## 2020-10-24 MED ORDER — APIXABAN 5 MG PO TABS
5.0000 mg | ORAL_TABLET | Freq: Two times a day (BID) | ORAL | Status: DC
  Administered 2020-10-24: 5 mg via ORAL
  Filled 2020-10-24: qty 1

## 2020-10-24 NOTE — Progress Notes (Signed)
[place] for heparin Indication: atrial fibrillation  Allergies  Allergen Reactions   Amoxicillin Other (See Comments)    Tongue swelling  Has patient had a PCN reaction causing immediate rash, facial/tongue/throat swelling, SOB or lightheadedness with hypotension: Unknown Has patient had a PCN reaction causing severe rash involving mucus membranes or skin necrosis: Unknown Has patient had a PCN reaction that required hospitalization: Unknown Has patient had a PCN reaction occurring within the last 10 years: Unknown If all of the above answers are "NO", then may proceed with Cephalosporin use.    Codeine Other (See Comments)   Latex Rash    band aids, tape    Patient Measurements: Height: '5\' 3"'$  (160 cm) Weight: 59 kg (130 lb) (per pt) IBW/kg (Calculated) : 52.4 Heparin Dosing Weight: TBW  Vital Signs: Temp: 98.5 F (36.9 C) (08/30 2354) Temp Source: Oral (08/30 2354) BP: 127/80 (08/30 2354) Pulse Rate: 89 (08/30 2354)  Labs: Recent Labs    10/22/20 1744 10/22/20 2114 10/23/20 1519 10/24/20 0033  HGB 11.2*  --   --  10.8*  HCT 31.7*  --   --  31.3*  PLT 294  --   --  317  HEPARINUNFRC  --   --   --  0.36  CREATININE 0.69  --  0.65  --   TROPONINIHS 3 3  --   --      Estimated Creatinine Clearance: 40.2 mL/min (by C-G formula based on SCr of 0.65 mg/dL).  Assessment: 87 y.o. female with Afib for heparin  Goal of Therapy:  Heparin level 0.3-0.7 units/ml Monitor platelets by anticoagulation protocol: Yes   Plan:  Continue Heparin at current rate Follow-up am labs.  Phillis Knack, PharmD, BCPS

## 2020-10-24 NOTE — Discharge Instructions (Addendum)

## 2020-10-24 NOTE — Progress Notes (Signed)
  Echocardiogram 2D Echocardiogram has been performed.  Kristi Montgomery 10/24/2020, 10:55 AM

## 2020-10-24 NOTE — TOC Benefit Eligibility Note (Signed)
Patient Teacher, English as a foreign language completed.    The patient is currently admitted and upon discharge could be taking Eliquis 5 mg.  The current 30 day co-pay is, $142.00 due to a $95.00 deductible remaining.  Should be a $47.00 monthly copay after meeting the deductible.   The patient is insured through [place], [place] Patient Advocate Specialist [place] Team Direct Number: [phone]  Fax: [phone]

## 2020-10-24 NOTE — Progress Notes (Signed)
   10/24/20 0737  Assess: MEWS Score  Temp 98.6 F (37 C)  BP (!) 150/62  Pulse Rate 65  ECG Heart Rate (!) 127  Resp 18  Level of Consciousness Alert  SpO2 100 %  O2 Device Room Air  Assess: MEWS Score  MEWS Temp 0  MEWS Systolic 0  MEWS Pulse 2  MEWS RR 0  MEWS LOC 0  MEWS Score 2  MEWS Score Color Yellow  Assess: if the MEWS score is Yellow or Red  Were vital signs taken at a resting state? Yes  Focused Assessment No change from prior assessment  Early Detection of Sepsis Score *See Row Information* Low  MEWS guidelines implemented *See Row Information* Yes  Treat  MEWS Interventions Administered scheduled meds/treatments  Pain Scale 0-10  Pain Score 0  Patients Stated Pain Goal 0  Take Vital Signs  Increase Vital Sign Frequency  Yellow: Q 2hr X 2 then Q 4hr X 2, if remains yellow, continue Q 4hrs  Escalate  MEWS: Escalate Yellow: discuss with charge nurse/RN and consider discussing with provider and RRT  Notify: Charge Nurse/RN  Name of Charge Nurse/RN Notified Christy RN  Date Charge Nurse/RN Notified 10/24/20  Time Charge Nurse/RN Notified [phone]  Document  Patient Outcome Other (Comment) (admitted w/ afib; MD aware of HR)  Progress note created (see row info) Yes

## 2020-10-24 NOTE — Progress Notes (Signed)
PROGRESS NOTE    Kristi Montgomery  [id number] DOB: April 21, 1930 DOA: 10/22/2020 PCP: Shon Baton, MD  Brief Narrative:Kristi Montgomery is a 87 y.o. female with medical history significant of HTN, anxiety, chronic iron deficiency anemia, GERD on PPI, questionable history of PAF, presented with new onset of palpitations. -2 weeks ago, patient had COVID infection, her symptoms involved generalized weakness, fatigue.  She recovered from that.  2 days prior to admission noticed palpitations and some dyspnea with activity, her pulse rate when she checked her blood pressure was very high and hence came to the emergency room. -In the ED she was noted to be in rapid A. fib, given IV Lopressor and admitted    Assessment & Plan:   Active Problems:   New onset atrial fibrillation (HCC) with RVR -Start p.o. metoprolol, monitor on telemetry today, IV PRN -Check 2D echocardiogram -Discontinue IV heparin, start Eliquis -CHA2DS2-VASc score is 4  Hypertension -Held amlodipine, ARB cut down -Metoprolol as above  History of iron deficiency anemia -Continue oral iron  History of GERD -Continue PPI  DVT prophylaxis: Eliquis Code Status: Full code Family Communication: No family at bedside Disposition Plan:  Status is: Observation  The patient will require care spanning > 2 midnights and should be moved to inpatient because: Hemodynamically unstable  Dispo: The patient is from: Home              Anticipated d/c is to: Home              Patient currently is not medically stable to d/c.   Difficult to place patient No    Procedures:   Antimicrobials:    Subjective: -feels okay overall, some uneasiness, palpitations with activity when her heart rate goes up  Objective: Vitals:   10/23/20 2030 10/23/20 2354 10/24/20 0357 10/24/20 0737  BP: 118/62 127/80 (!) 122/99 (!) 150/62  Pulse: 84 89 85 65  Resp: '20 18 18 18  '$ Temp: 98.7 F (37.1 C) 98.5 F (36.9 C) 98.5 F (36.9 C) 98.6 F  (37 C)  TempSrc: Oral Oral Oral Oral  SpO2: 99% 99% 97% 100%  Weight:      Height:        Intake/Output Summary (Last 24 hours) at 10/24/2020 1205 Last data filed at 10/24/2020 0839 Gross per 24 hour  Intake 751.75 ml  Output --  Net 751.75 ml   Filed Weights   10/23/20 1500  Weight: 59 kg    Examination:  General exam: Pleasant elderly female sitting up in bed, AAOx3, no distress HEENT: No JVD CVS: S1-S2, irregularly irregular rhythm Lungs: Clear bilaterally Abdomen: Soft, nontender, bowel sounds present Extremities: No edema Skin: No rashes on exposed skin   Data Reviewed:   CBC: Recent Labs  Lab 10/22/20 1744 10/24/20 0033  WBC 4.8 5.0  HGB 11.2* 10.8*  HCT 31.7* 31.3*  MCV 102.9* 104.7*  PLT 294 [id number]   Basic Metabolic Panel: Recent Labs  Lab 10/22/20 1744 10/23/20 1519  NA 134* 136  K 3.7 3.3*  CL 99 106  CO2 24 24  GLUCOSE 126* 162*  BUN 10 6*  CREATININE 0.69 0.65  CALCIUM 9.1 8.7*  MG  --  2.0  PHOS  --  2.9   GFR: Estimated Creatinine Clearance: 40.2 mL/min (by C-G formula based on SCr of 0.65 mg/dL). Liver Function Tests: No results for input(s): AST, ALT, ALKPHOS, BILITOT, PROT, ALBUMIN in the last 168 hours. No results for input(s): LIPASE, AMYLASE in the last  168 hours. No results for input(s): AMMONIA in the last 168 hours. Coagulation Profile: No results for input(s): INR, PROTIME in the last 168 hours. Cardiac Enzymes: No results for input(s): CKTOTAL, CKMB, CKMBINDEX, TROPONINI in the last 168 hours. BNP (last 3 results) No results for input(s): PROBNP in the last 8760 hours. HbA1C: No results for input(s): HGBA1C in the last 72 hours. CBG: No results for input(s): GLUCAP in the last 168 hours. Lipid Profile: No results for input(s): CHOL, HDL, LDLCALC, TRIG, CHOLHDL, LDLDIRECT in the last 72 hours. Thyroid Function Tests: Recent Labs    10/23/20 1519  TSH 1.149   Anemia Panel: No results for input(s): VITAMINB12,  FOLATE, FERRITIN, TIBC, IRON, RETICCTPCT in the last 72 hours. Urine analysis:    Component Value Date/Time   COLORURINE COLORLESS (A) 01/05/2018 1257   APPEARANCEUR CLEAR 01/05/2018 1257   LABSPEC 1.004 (L) 01/05/2018 1257   PHURINE 8.0 01/05/2018 1257   GLUCOSEU NEGATIVE 01/05/2018 1257   HGBUR NEGATIVE 01/05/2018 1257   BILIRUBINUR NEGATIVE 01/05/2018 1257   KETONESUR NEGATIVE 01/05/2018 1257   PROTEINUR NEGATIVE 01/05/2018 1257   NITRITE NEGATIVE 01/05/2018 1257   LEUKOCYTESUR NEGATIVE 01/05/2018 1257   Sepsis Labs: '@LABRCNTIP'$ (procalcitonin:4,lacticidven:4)  )No results found for this or any previous visit (from the past 240 hour(s)).       Radiology Studies: DG Chest 2 View  Result Date: 10/22/2020 CLINICAL DATA:  Weakness and shortness of breath. EXAM: CHEST - 2 VIEW COMPARISON:  January 05, 2018 FINDINGS: There is no evidence of acute infiltrate, pleural effusion or pneumothorax. The heart size and mediastinal contours are within normal limits. There is marked severity calcification of the aortic arch. Radiopaque surgical clips are seen overlying the right upper quadrant. The visualized skeletal structures are unremarkable. IMPRESSION: No active cardiopulmonary disease. Electronically Signed   By: Virgina Norfolk M.D.   On: 10/22/2020 18:32        Scheduled Meds:  apixaban  5 mg Oral BID   ferrous sulfate  325 mg Oral Q breakfast   irbesartan  75 mg Oral Daily   metoprolol tartrate  25 mg Oral BID   pantoprazole  40 mg Oral Daily   vitamin B-12  1,000 mcg Oral Daily   Continuous Infusions:   LOS: 0 days    Time spent: 33mn   PDomenic Polite MD [facility]   10/24/2020, 12:05 PM

## 2020-10-25 ENCOUNTER — Other Ambulatory Visit (HOSPITAL_COMMUNITY): Payer: Self-pay

## 2020-10-25 DIAGNOSIS — I4891 Unspecified atrial fibrillation: Secondary | ICD-10-CM | POA: Diagnosis not present

## 2020-10-25 LAB — BASIC METABOLIC PANEL
Anion gap: 6 (ref 5–15)
BUN: 8 mg/dL (ref 8–23)
CO2: 26 mmol/L (ref 22–32)
Calcium: 8.9 mg/dL (ref 8.9–10.3)
Chloride: 104 mmol/L (ref 98–111)
Creatinine, Ser: 0.71 mg/dL (ref 0.44–1.00)
GFR, Estimated: >60 mL/min (ref >60)
Glucose, Bld: 139 mg/dL — ABNORMAL HIGH (ref 70–99)
Potassium: 3.9 mmol/L (ref 3.5–5.1)
Sodium: 136 mmol/L (ref 135–145)

## 2020-10-25 LAB — CBC
HCT: 30 % — ABNORMAL LOW (ref 36.0–46.0)
Hemoglobin: 10.6 g/dL — ABNORMAL LOW (ref 12.0–15.0)
MCH: 36.8 pg — ABNORMAL HIGH (ref 26.0–34.0)
MCHC: 35.3 g/dL (ref 30.0–36.0)
MCV: 104.2 fL — ABNORMAL HIGH (ref 80.0–100.0)
Platelets: 293 10*3/uL (ref 150–400)
RBC: 2.88 MIL/uL — ABNORMAL LOW (ref 3.87–5.11)
RDW: 14.5 % (ref 11.5–15.5)
WBC: 4.7 10*3/uL (ref 4.0–10.5)
nRBC: 0 % (ref 0.0–0.2)

## 2020-10-25 MED ORDER — APIXABAN 2.5 MG PO TABS
2.5000 mg | ORAL_TABLET | Freq: Two times a day (BID) | ORAL | 0 refills | Status: AC
  Filled 2020-10-25: qty 60, 30d supply, fill #0

## 2020-10-25 MED ORDER — METOPROLOL TARTRATE 50 MG PO TABS
50.0000 mg | ORAL_TABLET | Freq: Two times a day (BID) | ORAL | 0 refills | Status: DC
Start: 2020-10-25 — End: 2020-11-15
  Filled 2020-10-25: qty 60, 30d supply, fill #0

## 2020-10-25 NOTE — Discharge Summary (Signed)
Physician Discharge Summary  Kristi Montgomery [id number] DOB: [birth year] DOA: 10/22/2020  PCP: Shon Baton, MD  Admit date: 10/22/2020 Discharge date: 10/25/2020  Time spent: 75mnutes  Recommendations for Outpatient Follow-up:  Dr. RVirgina Jockin 1 week, please monitor hemoglobin periodically Nonurgent cardiology follow-up in 1 month, referral sent   Discharge Diagnoses:  Active Problems:   New onset atrial fibrillation (HCC)   Atrial fibrillation with rapid ventricular response (HCC) Hypertension Anxiety Iron deficiency anemia GERD  Discharge Condition: Stable  Diet recommendation: Low-sodium  Filed Weights   10/23/20 1500  Weight: 59 kg    History of present illness:  Kristi COUEYis a 87y.o. female with medical history significant of HTN, anxiety, chronic iron deficiency anemia, GERD on PPI, questionable history of PAF, presented with new onset of palpitations. -2 weeks ago, patient had COVID infection, her symptoms involved generalized weakness, fatigue.  She recovered from that.  2 days prior to admission noticed palpitations and some dyspnea with activity, her pulse rate when she checked her blood pressure was very high and hence came to the emergency room. -In the ED she was noted to be in rapid A. fib, given IV Lopressor and admitted   Hospital Course:   New onset atrial fibrillation ([place] with RVR -Started on p.o. metoprolol, dose increased to 50 mg twice daily, heart rate controlled on this regimen, 2D echocardiogram obtained and was noted to have normal EF and no significant valvular heart disease -CHA2DS2-VASc score is 4, started on Eliquis for anticoagulation -TSH was noted to be within normal limits -Discharged home in a stable condition, cardiology referral sent for hospital follow-up   Hypertension -Discontinued amlodipine and telmisartan, Aldactone resumed -Metoprolol as above   History of iron deficiency anemia -Continue oral iron, no history of  active GI bleeding -Will need her hemoglobin monitored periodically as she is on anticoagulation now   History of GERD -Continue PPI    Discharge Exam: Vitals:   10/25/20 0018 10/25/20 0417  BP: 100/70 126/81  Pulse: 81 89  Resp: 16 16  Temp: 97.7 F (36.5 C) 97.7 F (36.5 C)  SpO2: 96% 100%    General: AAOx3 Cardiovascular: S1S2/Irregular rate and rhythm Respiratory: CTAB  Discharge Instructions   Discharge Instructions     Ambulatory referral to Cardiology   Complete by: As directed    Diet - low sodium heart healthy   Complete by: As directed    Increase activity slowly   Complete by: As directed       Allergies as of 10/25/2020       Reactions   Amoxicillin Other (See Comments)   Tongue swelling  Has patient had a PCN reaction causing immediate rash, facial/tongue/throat swelling, SOB or lightheadedness with hypotension: Unknown Has patient had a PCN reaction causing severe rash involving mucus membranes or skin necrosis: Unknown Has patient had a PCN reaction that required hospitalization: Unknown Has patient had a PCN reaction occurring within the last 10 years: Unknown If all of the above answers are "NO", then may proceed with Cephalosporin use.   Codeine Other (See Comments)   Latex Rash   band aids, tape        Medication List     STOP taking these medications    amLODipine 10 MG tablet Commonly known as: NORVASC   memantine 5 MG tablet Commonly known as: Namenda   telmisartan 80 MG tablet Commonly known as: MICARDIS       TAKE these medications  ALPRAZolam 0.25 MG tablet Commonly known as: XANAX Take 0.125 mg by mouth 2 (two) times daily as needed for anxiety.   Eliquis 2.5 MG Tabs tablet Generic drug: apixaban Take 1 tablet (2.5 mg total) by mouth 2 (two) times daily.   ferrous sulfate 325 (65 FE) MG tablet Take 325 mg by mouth daily with breakfast.   metoprolol tartrate 50 MG tablet Commonly known as: LOPRESSOR Take 1  tablet (50 mg total) by mouth 2 (two) times daily.   omeprazole 40 MG capsule Commonly known as: PRILOSEC Take 40 mg by mouth daily as needed (heartburn).   spironolactone 25 MG tablet Commonly known as: ALDACTONE Take 25 mg by mouth daily.   SYSTANE OP Place 1 Dose into both eyes daily as needed (dry eyes).   triamcinolone cream 0.5 % Commonly known as: KENALOG Apply 1 application topically 2 (two) times daily as needed (rash).   vitamin B-12 1000 MCG tablet Commonly known as: CYANOCOBALAMIN Take 1,000 mcg by mouth daily.       Allergies  Allergen Reactions   Amoxicillin Other (See Comments)    Tongue swelling  Has patient had a PCN reaction causing immediate rash, facial/tongue/throat swelling, SOB or lightheadedness with hypotension: Unknown Has patient had a PCN reaction causing severe rash involving mucus membranes or skin necrosis: Unknown Has patient had a PCN reaction that required hospitalization: Unknown Has patient had a PCN reaction occurring within the last 10 years: Unknown If all of the above answers are "NO", then may proceed with Cephalosporin use.    Codeine Other (See Comments)   Latex Rash    band aids, tape      The results of significant diagnostics from this hospitalization (including imaging, microbiology, ancillary and laboratory) are listed below for reference.    Significant Diagnostic Studies: DG Chest 2 View  Result Date: 10/22/2020 CLINICAL DATA:  Weakness and shortness of breath. EXAM: CHEST - 2 VIEW COMPARISON:  January 05, 2018 FINDINGS: There is no evidence of acute infiltrate, pleural effusion or pneumothorax. The heart size and mediastinal contours are within normal limits. There is marked severity calcification of the aortic arch. Radiopaque surgical clips are seen overlying the right upper quadrant. The visualized skeletal structures are unremarkable. IMPRESSION: No active cardiopulmonary disease. Electronically Signed   By:  Virgina Norfolk M.D.   On: 10/22/2020 18:32   ECHOCARDIOGRAM COMPLETE  Result Date: 10/24/2020    ECHOCARDIOGRAM REPORT   Patient Name:   Kristi Montgomery Date of Exam: 10/24/2020 Medical Rec #:  [id number]        Height:       63.0 in Accession #:    [id number]       Weight:       130.0 lb Date of Birth:  07-Jan-1931        BSA:          1.610 m Patient Age:    4 years         BP:           150/62 mmHg Patient Gender: F                HR:           91 bpm. Exam Location:  Inpatient Procedure: 2D Echo, 3D Echo, Color Doppler and Cardiac Doppler Indications:    I48.91* Unspeicified atrial fibrillation  History:        Patient has no prior history of Echocardiogram examinations.  Abnormal ECG, Arrythmias:Atrial Fibrillation,                 Signs/Symptoms:Chest Pain; Risk Factors:Hypertension. Covid                 infection two weeks prior.  Sonographer:    Roseanna Rainbow RDCS Referring Phys: [id number] [place]  1. Left ventricular ejection fraction, by estimation, is 60 to 65%. The left ventricle has normal function. The left ventricle has no regional wall motion abnormalities. Left ventricular diastolic function could not be evaluated.  2. Right ventricular systolic function is normal. The right ventricular size is normal. There is normal pulmonary artery systolic pressure.  3. Left atrial size was mildly dilated.  4. The mitral valve is normal in structure. Trivial mitral valve regurgitation.  5. Tricuspid valve regurgitation is moderate.  6. The aortic valve is tricuspid. There is mild calcification of the aortic valve. There is mild thickening of the aortic valve. Aortic valve regurgitation is not visualized. Mild to moderate aortic valve sclerosis/calcification is present, without any evidence of aortic stenosis.  7. The inferior vena cava is dilated in size with >50% respiratory variability, suggesting right atrial pressure of 8 mmHg. Comparison(s): No prior Echocardiogram.  Conclusion(s)/Recommendation(s): Otherwise normal echocardiogram, with minor abnormalities described in the report. FINDINGS  Left Ventricle: Left ventricular ejection fraction, by estimation, is 60 to 65%. The left ventricle has normal function. The left ventricle has no regional wall motion abnormalities. The left ventricular internal cavity size was small. There is borderline left ventricular hypertrophy. Left ventricular diastolic function could not be evaluated due to atrial fibrillation. Left ventricular diastolic function could not be evaluated. Right Ventricle: The right ventricular size is normal. Right vetricular wall thickness was not well visualized. Right ventricular systolic function is normal. There is normal pulmonary artery systolic pressure. The tricuspid regurgitant velocity is 2.61 m/s, and with an assumed right atrial pressure of 8 mmHg, the estimated right ventricular systolic pressure is [id number] mmHg. Left Atrium: Left atrial size was mildly dilated. Right Atrium: Right atrial size was normal in size. Pericardium: There is no evidence of pericardial effusion. Presence of pericardial fat pad. Mitral Valve: The mitral valve is normal in structure. There is mild thickening of the mitral valve leaflet(s). There is mild calcification of the mitral valve leaflet(s). Mild to moderate mitral annular calcification. Trivial mitral valve regurgitation.  MV peak gradient, 11.4 mmHg. The mean mitral valve gradient is 3.0 mmHg. Tricuspid Valve: The tricuspid valve is normal in structure. Tricuspid valve regurgitation is moderate . No evidence of tricuspid stenosis. Aortic Valve: The aortic valve is tricuspid. There is mild calcification of the aortic valve. There is mild thickening of the aortic valve. Aortic valve regurgitation is not visualized. Mild to moderate aortic valve sclerosis/calcification is present, without any evidence of aortic stenosis. Pulmonic Valve: The pulmonic valve was not well  visualized. Pulmonic valve regurgitation is not visualized. No evidence of pulmonic stenosis. Aorta: The aortic root, ascending aorta and aortic arch are all structurally normal, with no evidence of dilitation or obstruction. Venous: The inferior vena cava is dilated in size with greater than 50% respiratory variability, suggesting right atrial pressure of 8 mmHg. IAS/Shunts: The atrial septum is grossly normal.  LEFT VENTRICLE PLAX 2D LVIDd:         2.95 cm LVIDs:         2.20 cm LV PW:         1.20 cm LV IVS:  1.10 cm LVOT diam:     1.50 cm     3D Volume EF: LV SV:         28          3D EF:        60 % LV SV Index:   17          LV EDV:       74 ml LVOT Area:     1.77 cm    LV ESV:       29 ml                            LV SV:        44 ml  LV Volumes (MOD) LV vol d, MOD A2C: 37.8 ml LV vol d, MOD A4C: 46.5 ml LV vol s, MOD A2C: 11.5 ml LV vol s, MOD A4C: 16.8 ml LV SV MOD A2C:     26.3 ml LV SV MOD A4C:     46.5 ml LV SV MOD BP:      27.8 ml RIGHT VENTRICLE            IVC RV S prime:     6.74 cm/s  IVC diam: 2.20 cm TAPSE (M-mode): 1.6 cm LEFT ATRIUM             Index       RIGHT ATRIUM           Index LA diam:        3.40 cm 2.11 cm/m  RA Area:     13.70 cm LA Vol (A2C):   38.8 ml 24.10 ml/m RA Volume:   30.90 ml  19.19 ml/m LA Vol (A4C):   48.4 ml 30.06 ml/m LA Biplane Vol: 43.5 ml 27.02 ml/m  AORTIC VALVE LVOT Vmax:   89.75 cm/s LVOT Vmean:  62.150 cm/s LVOT VTI:    0.156 m  AORTA Ao Root diam: 2.60 cm Ao Asc diam:  2.90 cm MITRAL VALVE                TRICUSPID VALVE MV Area (PHT): 2.88 cm     TR Peak grad:   27.2 mmHg MV Area VTI:   0.85 cm     TR Vmax:        261.00 cm/s MV Peak grad:  11.4 mmHg MV Mean grad:  3.0 mmHg     SHUNTS MV Vmax:       1.69 m/s     Systemic VTI:  0.16 m MV Vmean:      69.2 cm/s    Systemic Diam: 1.50 cm MV Decel Time: 263 msec MV E velocity: 148.70 cm/s Buford Dresser MD Electronically signed by Buford Dresser MD Signature Date/Time: 10/24/2020/2:23:32  PM    Final     Microbiology: No results found for this or any previous visit (from the past 240 hour(s)).   Labs: Basic Metabolic Panel: Recent Labs  Lab 10/22/20 1744 10/23/20 1519 10/25/20 0257  NA 134* 136 136  K 3.7 3.3* 3.9  CL 99 106 104  CO2 '24 24 26  '$ GLUCOSE 126* 162* 139*  BUN 10 6* 8  CREATININE 0.69 0.65 0.71  CALCIUM 9.1 8.7* 8.9  MG  --  2.0  --   PHOS  --  2.9  --    Liver Function Tests: No results for input(s): AST, ALT, ALKPHOS, BILITOT, PROT, ALBUMIN in the last 168  hours. No results for input(s): LIPASE, AMYLASE in the last 168 hours. No results for input(s): AMMONIA in the last 168 hours. CBC: Recent Labs  Lab 10/22/20 1744 10/24/20 0033 10/25/20 0257  WBC 4.8 5.0 4.7  HGB 11.2* 10.8* 10.6*  HCT 31.7* 31.3* 30.0*  MCV 102.9* 104.7* 104.2*  PLT 294 317 293   Cardiac Enzymes: No results for input(s): CKTOTAL, CKMB, CKMBINDEX, TROPONINI in the last 168 hours. BNP: BNP (last 3 results) No results for input(s): BNP in the last 8760 hours.  ProBNP (last 3 results) No results for input(s): PROBNP in the last 8760 hours.  CBG: No results for input(s): GLUCAP in the last 168 hours.     Signed:  Domenic Polite MD.  [facility] 10/25/2020, 3:09 PM

## 2020-10-25 NOTE — Progress Notes (Signed)
Physical Therapy Evaluation Patient Details Name: Kristi Montgomery MRN: [id number] DOB: [birth year] Today's Date: 10/25/2020   History of Present Illness  Pt is an 87yo female presenting [facility] ED on 8/29 with complaints of weakness and shortness of breath; found to have new onset of atrial fibrillation with rapid ventricular response. PMH: HTN, anemia, history of COVID s/p 2 weeks, memory loss.   Clinical Impression  Pt presents with the impairments above and problems listed below. Pt min guard for all mobility tasks today, including ambulation with RW and stair training. Pt with one brief episode of HR to 150 but quickly returned to mid-120s. HR remained in 120s to low 130s for remainder of mobility. HR follow session was in low 100s. Educated pt on the importance of pacing and taking rest breaks when fatigued or HR is elevated. Encouraged walking program at home utilizing RW for safety with regular rest breaks. Anticipate no need for physical therapy upon discharge, as pt will likely progress well. We will continue to follow the patient acutely to promote independence with functional mobility.     Follow Up Recommendations No PT follow up    Equipment Recommendations  None recommended by PT (Pt has required DME)    Recommendations for Other Services       Precautions / Restrictions Precautions Precautions: Fall Restrictions Weight Bearing Restrictions: No      Mobility  Bed Mobility Overal bed mobility: Needs Assistance Bed Mobility: Supine to Sit     Supine to sit: Min guard     General bed mobility comments: Pt min guard for safety only.    Transfers Overall transfer level: Needs assistance Equipment used: None Transfers: Sit to/from Stand Sit to Stand: Min guard         General transfer comment: Pt min guard for safety only.  Ambulation/Gait Ambulation/Gait assistance: Min guard Gait Distance (Feet): 250 Feet Assistive device: Rolling walker (2  wheeled);None Gait Pattern/deviations: Step-through pattern;Decreased step length - right;Decreased step length - left;Decreased dorsiflexion - right;Decreased dorsiflexion - left Gait velocity: decreased   General Gait Details: Pt ambulated ~75 feet with no AD. Presenting with decreased step length and DF bilaterally with one minor LOB but pt was able to self correct. Had pt use RW for remainder of gait and pt demonstrated increased steadiness and comfort. No overt LOB.  Moderate verbal cues for proximity to device. Educated pt on using RW upon discharge for increased safety, pt and daughter agreeable. Pt HR monitored and one brief instance of HR in 150s, but with standing rest break and pursed-lip breathing HR dropped down to 120s. HR remained in 120s-low 130s throughout remainder of mobility.  Stairs Stairs: Yes Stairs assistance: Min guard Stair Management: One rail Left;Alternating pattern;Step to pattern;Sideways;Forwards Number of Stairs: 3 General stair comments: Pt performed stair ascent using alternating pattern with LUE holding railing. Min guard for safety. Pt educated on sideways stair navigation with step-to pattern with both hands holding railing; min guard for safety. No overt LOB. Educated pt and daughter on using sideways technique upon discharge for increased safety and having supervision to min guard assist for safety.  Wheelchair Mobility    Modified Rankin (Stroke Patients Only)       Balance Overall balance assessment: Needs assistance Sitting-balance support: No upper extremity supported;Feet supported Sitting balance-Leahy Scale: Good     Standing balance support: Bilateral upper extremity supported;During functional activity Standing balance-Leahy Scale: Fair Standing balance comment: Pt able to stand and walk household distances  without UE support; demonstrated increased steadiness with BUE on RW during functional ambulation tasks.                              Pertinent Vitals/Pain Pain Assessment: No/denies pain    Home Living Family/patient expects to be discharged to:: Private residence Living Arrangements: Spouse/significant other;Children (Daughter Kristi Montgomery) Available Help at Discharge: Family;Available 24 hours/day Type of Home: House Home Access: Stairs to enter Entrance Stairs-Rails: Left Entrance Stairs-Number of Steps: 4 Home Layout: One level Home Equipment: Cane - single point;Walker - 2 wheels      Prior Function Level of Independence: Independent         Comments: Pt reports independence with gait but does mention that she often furniture surfs in the house     Hand Dominance   Dominant Hand: Right    Extremity/Trunk Assessment   Upper Extremity Assessment Upper Extremity Assessment: Generalized weakness    Lower Extremity Assessment Lower Extremity Assessment: Generalized weakness    Cervical / Trunk Assessment Cervical / Trunk Assessment: Normal  Communication   Communication: No difficulties  Cognition Arousal/Alertness: Awake/alert Behavior During Therapy: WFL for tasks assessed/performed Overall Cognitive Status: History of cognitive impairments - at baseline                                        General Comments General comments (skin integrity, edema, etc.): HR monitored throughout session; when elevated provided rest breaks and encouraged pursed lip breathing. Daughter Kristi Montgomery present.    Exercises     Assessment/Plan    PT Assessment Patient needs continued PT services  PT Problem List Decreased strength;Decreased activity tolerance;Decreased balance;Decreased mobility;Decreased knowledge of use of DME;Cardiopulmonary status limiting activity;Decreased safety awareness       PT Treatment Interventions DME instruction;Gait training;Functional mobility training;Stair training;Therapeutic activities;Therapeutic exercise;Balance training;Patient/family education     PT Goals (Current goals can be found in the Care Plan section)  Acute Rehab PT Goals Patient Stated Goal: To be able to sit in her deck swing PT Goal Formulation: With patient/family Time For Goal Achievement: 11/08/20 Potential to Achieve Goals: Good    Frequency Min 3X/week   Barriers to discharge        Co-evaluation               AM-PAC PT "6 Clicks" Mobility  Outcome Measure Help needed turning from your back to your side while in a flat bed without using bedrails?: None Help needed moving from lying on your back to sitting on the side of a flat bed without using bedrails?: A Little Help needed moving to and from a bed to a chair (including a wheelchair)?: A Little Help needed standing up from a chair using your arms (e.g., wheelchair or bedside chair)?: A Little Help needed to walk in hospital room?: A Little Help needed climbing 3-5 steps with a railing? : A Little 6 Click Score: 19    End of Session Equipment Utilized During Treatment: Gait belt Activity Tolerance: Patient tolerated treatment well Patient left: in bed;with call bell/phone within reach;with family/visitor present Kristi Montgomery (daughter); sitting EOB) Nurse Communication: Mobility status;Other (comment) (HR during mobility) PT Visit Diagnosis: Muscle weakness (generalized) (M62.81);Other abnormalities of gait and mobility (R26.89)    Time: [id number] PT Time Calculation (min) (ACUTE ONLY): 24 min   Charges:   PT Evaluation $PT  Eval Low Complexity: 1 Low PT Treatments $Gait Training: 8-22 mins        Dawayne Cirri, SPT Dawayne Cirri 10/25/2020, 3:38 PM

## 2020-10-25 NOTE — Plan of Care (Signed)
  Problem: Education: Goal: Knowledge of disease or condition will improve Outcome: Progressing Goal: Understanding of medication regimen will improve Outcome: Progressing   Problem: Activity: Goal: Ability to tolerate increased activity will improve Outcome: Progressing   Problem: Clinical Measurements: Goal: Will remain free from infection Outcome: Progressing   Problem: Activity: Goal: Risk for activity intolerance will decrease Outcome: Progressing   Problem: Nutrition: Goal: Adequate nutrition will be maintained Outcome: Progressing

## 2020-11-09 ENCOUNTER — Other Ambulatory Visit (HOSPITAL_COMMUNITY): Payer: Self-pay

## 2020-11-09 ENCOUNTER — Other Ambulatory Visit (HOSPITAL_BASED_OUTPATIENT_CLINIC_OR_DEPARTMENT_OTHER): Payer: Self-pay

## 2020-11-09 ENCOUNTER — Telehealth (HOSPITAL_COMMUNITY): Payer: Self-pay

## 2020-11-09 NOTE — Telephone Encounter (Signed)
Pharmacy Transitions of Care Follow-up Telephone Call  Date of discharge: 10/25/20  Discharge Diagnosis: Afib  How have you been since you were released from the hospital? Patient doing well since discharge. Still has a high heart rate but is discussing this with her MD.   Medication changes made at discharge:  - START: Eliquis 2.5 mg, Metoprolol '50mg'$  BID  Medication changes verified by the patient? Yes    Medication Accessibility:  Home Pharmacy: not discussed, patients meds have already been sent to home pharmacy. Patient has insurance for coverage of meds.    Medication Review:  APIXABAN (ELIQUIS)  Apixaban 2.5 mg BID initiated on 10/25/20.  - Discussed importance of taking medication around the same time everyday  - Reviewed potential DDIs with patient  - Advised patient of medications to avoid (NSAIDs, ASA)  - Educated that Tylenol (acetaminophen) will be the preferred analgesic to prevent risk of bleeding  - Emphasized importance of monitoring for signs and symptoms of bleeding (abnormal bruising, prolonged bleeding, nose bleeds, bleeding from gums, discolored urine, black tarry stools)  - Advised patient to alert all providers of anticoagulation therapy prior to starting a new medication or having a procedure    Follow-up Appointments:  PCP Hospital f/u appt confirmed? Has seen PCP since discharge   [facility] f/u appt confirmed? Scheduled to see Dr. Harl Bowie on 12/13/20 @ Cardiology.   If their condition worsens, is the pt aware to call PCP or go to the Emergency Dept.? yes  Final Patient Assessment: Patient has f/u scheduled and refills at home pharmacy.

## 2020-11-15 ENCOUNTER — Emergency Department (HOSPITAL_COMMUNITY): Payer: Medicare Other

## 2020-11-15 ENCOUNTER — Emergency Department (HOSPITAL_COMMUNITY)
Admission: EM | Admit: 2020-11-15 | Discharge: 2020-11-15 | Disposition: A | Discharge status: Home / Self Care | Payer: Medicare Other | Attending: Emergency Medicine | Admitting: Emergency Medicine

## 2020-11-15 ENCOUNTER — Encounter (HOSPITAL_COMMUNITY): Payer: Self-pay | Admitting: Emergency Medicine

## 2020-11-15 ENCOUNTER — Ambulatory Visit (INDEPENDENT_AMBULATORY_CARE_PROVIDER_SITE_OTHER)
Admission: EM | Admit: 2020-11-15 | Discharge: 2020-11-15 | Disposition: A | Discharge status: Home / Self Care | Payer: Medicare Other | Source: Home / Self Care

## 2020-11-15 DIAGNOSIS — I4891 Unspecified atrial fibrillation: Secondary | ICD-10-CM | POA: Insufficient documentation

## 2020-11-15 DIAGNOSIS — I10 Essential (primary) hypertension: Secondary | ICD-10-CM

## 2020-11-15 DIAGNOSIS — Z87891 Personal history of nicotine dependence: Secondary | ICD-10-CM | POA: Insufficient documentation

## 2020-11-15 DIAGNOSIS — Z85828 Personal history of other malignant neoplasm of skin: Secondary | ICD-10-CM | POA: Insufficient documentation

## 2020-11-15 DIAGNOSIS — Z7901 Long term (current) use of anticoagulants: Secondary | ICD-10-CM | POA: Insufficient documentation

## 2020-11-15 DIAGNOSIS — R519 Headache, unspecified: Secondary | ICD-10-CM

## 2020-11-15 DIAGNOSIS — R0789 Other chest pain: Secondary | ICD-10-CM

## 2020-11-15 DIAGNOSIS — Z91040 Latex allergy status: Secondary | ICD-10-CM | POA: Diagnosis not present

## 2020-11-15 DIAGNOSIS — Z79899 Other long term (current) drug therapy: Secondary | ICD-10-CM | POA: Diagnosis not present

## 2020-11-15 DIAGNOSIS — R531 Weakness: Secondary | ICD-10-CM | POA: Diagnosis present

## 2020-11-15 LAB — BASIC METABOLIC PANEL
Anion gap: 8 (ref 5–15)
BUN: 13 mg/dL (ref 8–23)
CO2: 25 mmol/L (ref 22–32)
Calcium: 9 mg/dL (ref 8.9–10.3)
Chloride: 101 mmol/L (ref 98–111)
Creatinine, Ser: 0.85 mg/dL (ref 0.44–1.00)
GFR, Estimated: >60 mL/min (ref >60)
Glucose, Bld: 186 mg/dL — ABNORMAL HIGH (ref 70–99)
Potassium: 4 mmol/L (ref 3.5–5.1)
Sodium: 134 mmol/L — ABNORMAL LOW (ref 135–145)

## 2020-11-15 LAB — CBC
HCT: 32.9 % — ABNORMAL LOW (ref 36.0–46.0)
Hemoglobin: 11.4 g/dL — ABNORMAL LOW (ref 12.0–15.0)
MCH: 37 pg — ABNORMAL HIGH (ref 26.0–34.0)
MCHC: 34.7 g/dL (ref 30.0–36.0)
MCV: 106.8 fL — ABNORMAL HIGH (ref 80.0–100.0)
Platelets: 250 10*3/uL (ref 150–400)
RBC: 3.08 MIL/uL — ABNORMAL LOW (ref 3.87–5.11)
RDW: 16.7 % — ABNORMAL HIGH (ref 11.5–15.5)
WBC: 7.2 10*3/uL (ref 4.0–10.5)
nRBC: 0.3 % — ABNORMAL HIGH (ref 0.0–0.2)

## 2020-11-15 LAB — TROPONIN I (HIGH SENSITIVITY): Troponin I (High Sensitivity): 4 ng/L (ref <18)

## 2020-11-15 LAB — MAGNESIUM: Magnesium: 1.9 mg/dL (ref 1.7–2.4)

## 2020-11-15 MED ORDER — METOPROLOL TARTRATE 25 MG PO TABS: 50.0000 mg | ORAL_TABLET | Freq: Once | ORAL | Status: DC

## 2020-11-15 MED ORDER — METOPROLOL TARTRATE 5 MG/5ML IV SOLN: 5.0000 mg | Freq: Once | INTRAVENOUS | Status: DC

## 2020-11-15 MED ORDER — DILTIAZEM HCL 60 MG PO TABS
60.0000 mg | ORAL_TABLET | Freq: Once | ORAL | Status: AC
  Administered 2020-11-15: 60 mg via ORAL
  Filled 2020-11-15: qty 1

## 2020-11-15 MED ORDER — DILTIAZEM HCL 25 MG/5ML IV SOLN
10.0000 mg | Freq: Once | INTRAVENOUS | Status: AC
  Administered 2020-11-15: 10 mg via INTRAVENOUS
  Filled 2020-11-15: qty 5

## 2020-11-15 MED ORDER — SODIUM CHLORIDE 0.9 % IV BOLUS
500.0000 mL | Freq: Once | INTRAVENOUS | Status: AC
  Administered 2020-11-15: 500 mL via INTRAVENOUS

## 2020-11-15 MED ORDER — APIXABAN 2.5 MG PO TABS
2.5000 mg | ORAL_TABLET | Freq: Once | ORAL | Status: AC
  Administered 2020-11-15: 2.5 mg via ORAL
  Filled 2020-11-15: qty 1

## 2020-11-15 MED ORDER — DILTIAZEM HCL 60 MG PO TABS: 60.0000 mg | ORAL_TABLET | Freq: Three times a day (TID) | ORAL | 0 refills | Status: DC

## 2020-11-15 NOTE — ED Provider Notes (Signed)
Emergency Medicine Provider Triage Evaluation Note  Kristi Montgomery , a 87 y.o. female  was evaluated in triage.  Pt complains of weakness.  Patient states that symptoms started several days ago but have been worse today.  Patient with history of A. fib, was recently admitted for symptoms associated with this.  She is on Eliquis, unsure if she took her dose this morning.  Review of Systems  Positive: Weakness Negative: Fevers, chills, nausea, vomiting, diarrhea  Physical Exam  BP (!) 164/104 (BP Location: Right Arm)   Pulse (!) 129   Temp 98.9 F (37.2 C) (Oral)   Resp 18   Ht 5\' 3"  (1.6 m)   Wt 59 kg   SpO2 98%   BMI 23.03 kg/m  Gen:   Awake, no distress   Resp:  Normal effort  MSK:   Moves extremities without difficulty    Medical Decision Making  Medically screening exam initiated at 5:18 PM.  Appropriate orders placed.  Kristi Montgomery was informed that the remainder of the evaluation will be completed by another provider, this initial triage assessment does not replace that evaluation, and the importance of remaining in the ED until their evaluation is complete.     Nestor Lewandowsky 11/15/20 [place], [place], DO 11/15/20 1757

## 2020-11-15 NOTE — ED Provider Notes (Signed)
I have personally seen and examined the patient. I have reviewed the documentation on PMH/FH/Soc Hx. I have discussed the plan of care with the resident and patient.  I have reviewed and agree with the resident's documentation. Please see associated encounter note.  Briefly, the patient is a 87 y.o. female here with palpitations.  Recent A. fib diagnosis on Eliquis and Lopressor.  States that she has been compliant with her medications but has not taken any of her Lopressor today.  She arrives with a heart rate mostly in the 1 20-140 range.  She denies any chest pain or shortness of breath.  No significant anemia, electrolyte abnormality, kidney injury.  Troponin within normal limits.  Talk to cardiology as atrial fibrillation is fairly new.  She has been on Eliquis and Lopressor for the last 3 weeks.  Has not followed up with cardiology as of yet.  Decision was made with Dr. Terri Skains to discontinue Lopressor and switch her over to diltiazem.  We will give her dose IV diltiazem 10 and p.o. 60 and observe her and see if we can get better rate control.  We will put an ambulatory referral to atrial fibrillation clinic.  Assuming we can get her heart rate under better control we will discharge her.  May consider admission if still having difficulty getting heart rate under control.  Please see my resident note for further results, evaluation, disposition of the patient  This chart was dictated using voice recognition software.  Despite best efforts to proofread,  errors can occur which can change the documentation meaning.    EKG Interpretation  Date/Time:  Thursday November 15 2020 18:28:23 EDT Ventricular Rate:  126 PR Interval:    QRS Duration: 66 QT Interval:  307 QTC Calculation: 447 R Axis:   83 Text Interpretation: Atrial fibrillation Borderline right axis deviation Low voltage, precordial leads Minimal ST depression, inferior leads Confirmed by Lennice Sites ([id number]) on 11/15/2020 6:29:07 PM           Lennice Sites, DO 11/15/20 1938

## 2020-11-15 NOTE — ED Triage Notes (Addendum)
Patient sent to [facility] for shortness of breath that is exacerbated by minimal exertion that started several days ago. Patient states she has also had difficulty controlling her blood pressure despite taking antihypertensives as prescribed. Patient alert, oriented, speaking in complete sentences. EKG shows afib with ventricular rate of 140-160.  Patient is prescribed eliquis, patient states she takes this medication twice per day and is sure that she took her dose last night but is unsure if she took her dose this morning.

## 2020-11-15 NOTE — ED Triage Notes (Signed)
Pt c/o "my blood pressure and heart rate have been acting crazy." Onset today.    Pt states her home recordings for today were:  153/104 (HR 123) 145/89 (HR 83) 167/106 (HR 115) 170/105 (HR 127)  Denies chest pain,

## 2020-11-15 NOTE — ED Provider Notes (Signed)
[facility] EMERGENCY DEPARTMENT Provider Note   CSN: [id number] Arrival date & time: 11/15/20  1656     History Chief Complaint  Patient presents with   Afib RVR    Kristi Montgomery is a 87 y.o. female with PMHx HTN, Atrial Fibrillation (on Eliquis) who presents for evaluation of generalized weakness.   Patient reports approximately 3 week history of generalized weakness, intermittent palpitations, and increased difficulty with ambulation.  She states that she experienced profound palpitations today, which prompted her to present to emergency department for further evaluation.  No recent fevers, chills, or cough.  She denies any recent chest pain, shortness of breath, lower extremity edema, or syncope.  No numbness or weakness.  She is unsure whether or not she took her dose of metoprolol this morning.     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/06/2017   Bilateral dry eyes 07/13/2014   Essential hypertension 07/29/2012   Choroidal nevus 07/10/2011   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   Nuclear cataract 07/10/2011   CHEST PAIN UNSPECIFIED 01/25/2009   DYSPHAGIA UNSPECIFIED 01/25/2009    Past Surgical History:  Procedure Laterality Date   CHOLECYSTECTOMY     MELANOMA EXCISION     x5     OB History     Gravida      Para      Term       Preterm      AB      Living  2      SAB      IAB      Ectopic      Multiple      Live Births              Family History  Problem Relation Age of Onset   Congestive Heart Failure Mother    Heart failure Mother    Heart attack Father        age 63   Heart Problems Brother    Cancer Brother    Cancer Brother        pancreatic    Cancer Sister        breast    Cancer Sister        breast   Breast cancer Neg Hx     Social History   Tobacco Use   Smoking status: Former    Types: Cigarettes    Quit date: 02/25/1988    Years since quitting: 32.7   Smokeless tobacco: Never  Vaping Use   Vaping Use: Never used  Substance Use Topics   Alcohol use: No   Drug use: No    Home Medications Prior to Admission medications   Medication Sig Start Date End Date Taking? Authorizing Provider  ALPRAZolam (XANAX) 0.25 MG tablet Take 0.125 mg by mouth 2 (two) times daily as needed  for anxiety. 12/09/17  Yes [provider]  apixaban (ELIQUIS) 2.5 MG TABS tablet Take 1 tablet (2.5 mg total) by mouth 2 (two) times daily. 10/25/20  Yes Domenic Polite, MD  Cholecalciferol (VITAMIN D-3) 25 MCG (1000 UT) CAPS Take 1,000 Units by mouth daily.   Yes [provider]  diltiazem (CARDIZEM) 60 MG tablet Take 1 tablet (60 mg total) by mouth 3 (three) times daily. 11/15/20  Yes Violet Baldy, MD  ferrous sulfate 325 (65 FE) MG tablet Take 325 mg by mouth every other day.   Yes [provider]  MAGNESIUM PO Take 1 tablet by mouth daily.   Yes [provider]  Olopatadine HCl (PATADAY OP) Place 1 drop into both eyes daily at 12 noon.   Yes [provider]  omeprazole (PRILOSEC) 40 MG capsule Take 40 mg by mouth daily as needed (heartburn).   Yes [provider]  REFRESH OPTIVE ADVANCED PF 0.5-1-0.5 % SOLN Place 1 drop into both eyes at bedtime.   Yes [provider]  SYSTANE ULTRA PF 0.4-0.3 % SOLN Place 1 drop into both eyes in  the morning, at noon, in the evening, and at bedtime.   Yes [provider]  vitamin B-12 (CYANOCOBALAMIN) 1000 MCG tablet Take 1,000 mcg by mouth daily.   Yes [provider]  VITAMIN E PO Take 1 capsule by mouth daily.   Yes [provider]  spironolactone (ALDACTONE) 25 MG tablet Take 25 mg by mouth daily. 09/05/19   [provider]    Allergies    Amoxicillin, Amoxicillin-pot clavulanate, Codeine, Latex, and Tape  Review of Systems   Review of Systems  Physical Exam Updated Vital Signs BP (!) 152/85   Pulse 85   Temp 98.9 F (37.2 C) (Oral)   Resp 16   Ht 5\' 3"  (1.6 m)   Wt 59 kg   SpO2 97%   BMI 23.03 kg/m   Physical Exam  ED Results / Procedures / Treatments   Labs (all labs ordered are listed, but only abnormal results are displayed) Labs Reviewed  BASIC METABOLIC PANEL - Abnormal; Notable for the following components:      Result Value   Sodium 134 (*)    Glucose, Bld 186 (*)    All other components within normal limits  CBC - Abnormal; Notable for the following components:   RBC 3.08 (*)    Hemoglobin 11.4 (*)    HCT 32.9 (*)    MCV 106.8 (*)    MCH 37.0 (*)    RDW 16.7 (*)    nRBC 0.3 (*)    All other components within normal limits  MAGNESIUM  TROPONIN I (HIGH SENSITIVITY)    EKG EKG Interpretation  Date/Time:  Thursday November 15 2020 18:28:23 EDT Ventricular Rate:  126 PR Interval:    QRS Duration: 66 QT Interval:  307 QTC Calculation: 447 R Axis:   83 Text Interpretation: Atrial fibrillation Borderline right axis deviation Low voltage, precordial leads Minimal ST depression, inferior leads Confirmed by Lennice Sites ([id number]) on 11/15/2020 6:29:07 PM  Radiology DG Chest 2 View  Result Date: 11/15/2020 CLINICAL DATA:  Shortness of breath.  Irregular heart rate. EXAM: CHEST - 2 VIEW COMPARISON:  Chest x-ray dated October 22, 2020. FINDINGS: The heart size and mediastinal contours are within normal limits.  Normal pulmonary vascularity. New trace bilateral pleural effusions. No consolidation or pneumothorax. No acute osseous abnormality. IMPRESSION: 1. New trace bilateral pleural effusions. Electronically Signed  By: Titus Dubin M.D.   On: 11/15/2020 19:47    Procedures Procedures   Medications Ordered in ED Medications  diltiazem (CARDIZEM) injection 10 mg (10 mg Intravenous Given 11/15/20 1845)  diltiazem (CARDIZEM) tablet 60 mg (60 mg Oral Given 11/15/20 1913)  sodium chloride 0.9 % bolus 500 mL (0 mLs Intravenous Stopped 11/15/20 2102)  apixaban (ELIQUIS) tablet 2.5 mg (2.5 mg Oral Given 11/15/20 2119)    ED Course  I have reviewed the triage vital signs and the nursing notes.  Pertinent labs & imaging results that were available during my care of the patient were reviewed by me and considered in my medical decision making (see chart for details).  Clinical Course as of 11/15/20 2311  Thu Nov 15, 2020  2045 CBC with stable noncritical anemia.  BMP with mild hyponatremia with sodium 134.  Remainder of electrolytes are normal.  Kidney function is normal.  Magnesium 1.9.  Troponin is normal at 4.  EKG demonstrates atrial fibrillation with RVR.  Chest x-ray without acute cardiopulmonary abnormality.  Generalized weakness is likely due to arrhythmia, atrial fibrillation with RVR.  Case was discussed with cardiology, who recommends that we trial of diltiazem and discharged the patient on 60 mg of diltiazem with 3 times daily if she responds appropriately.  They will follow-up with the patient in outpatient setting.  Patient was given 10 mg of IV diltiazem, followed by 60 mg p.o.  with significant improvement of her heart rate.  She is now rate controlled.  Patient was observed in the emergency department for 4 hours without relapsing rhythm. [CH]    Clinical Course User Index [CH] Violet Baldy, MD   MDM Rules/Calculators/A&P                           87 y.o. female with past medical  history as above who presents for evaluation of generalized weakness. Afebrile and hemodynamically stable, with vital signs notable for tachycardia.  Exam as detailed above. CBC with stable noncritical anemia.  BMP with mild hyponatremia with sodium 134.  Remainder of electrolytes are normal.  Kidney function is normal.  Magnesium 1.9.  Troponin is normal at 4.  EKG demonstrates atrial fibrillation with RVR.  Chest x-ray without acute cardiopulmonary abnormality.  Generalized weakness is likely due to arrhythmia, atrial fibrillation with RVR.  Case was discussed with cardiology, who recommends that we trial of diltiazem and discharged the patient on 60 mg of diltiazem with 3 times daily if she responds appropriately.  They will follow-up with the patient in outpatient setting.  Patient was given 10 mg of IV diltiazem, followed by 60 mg p.o.  with significant improvement of her heart rate.  She is now rate controlled.  Patient was observed in the emergency department for 4 hours without relapsing rhythm.  Patient was discharged on diltiazem regimen, with metoprolol discontinued.  Final Clinical Impression(s) / ED Diagnoses Final diagnoses:  Atrial fibrillation with rapid ventricular response [facility])    Rx / DC Orders ED Discharge Orders          Ordered    Amb Referral to AFIB Clinic        11/15/20 1939    diltiazem (CARDIZEM) 60 MG tablet  3 times daily        11/15/20 2226             Violet Baldy, MD 11/15/20 [place], [place], DO 11/15/20 2312

## 2020-11-15 NOTE — Discharge Instructions (Signed)
You have severely uncontrolled atrial fibrillation with RVR ranging between 135-160bpm. As this is not new, you do not have chest pain, we will not call an ambulance. Please have your husband take you straight to the emergency room now.

## 2020-11-15 NOTE — ED Provider Notes (Signed)
[place]   MRN: [id number] DOB: Aug 04, 1930  Subjective:   Kristi Montgomery is a 87 y.o. female presenting for check on her atrial fibrillation with RVR. Was admitted for new onset atrial fibrillation with RVR 10/22/2020.  She was started on metoprolol and Eliquis.  Does not have any antiarrhythmics.  Was not cardioverted to the best of my knowledge.  She did have an echo done that showed a normal EF.  Has a non-urgent cardiology follow 12/13/2020 through [facility] heart care. Had labs for follow up 1 week after her discharge. Results are as below.  Today, reports that she has ongoing intermittent episodes of chest pain and headache.  Last episode was earlier today had mild to moderate midsternal chest pain that resolved with rest.  Also had a slight headache at the same time that resolved with the same rest.  She has been taking readings of her pulse and blood pressure at home and today read:   153/104 (HR 123) 145/89 (HR 83) 167/106 (HR 115) 170/105 (HR 127)  No current facility-administered medications for this encounter.  Current Outpatient Medications:    ALPRAZolam (XANAX) 0.25 MG tablet, Take 0.125 mg by mouth 2 (two) times daily as needed for anxiety., Disp: , Rfl:    apixaban (ELIQUIS) 2.5 MG TABS tablet, Take 1 tablet (2.5 mg total) by mouth 2 (two) times daily., Disp: 60 tablet, Rfl: 0   ferrous sulfate 325 (65 FE) MG tablet, Take 325 mg by mouth daily with breakfast., Disp: , Rfl:    metoprolol tartrate (LOPRESSOR) 50 MG tablet, Take 1 tablet (50 mg total) by mouth 2 (two) times daily., Disp: 60 tablet, Rfl: 0   omeprazole (PRILOSEC) 40 MG capsule, Take 40 mg by mouth daily as needed (heartburn)., Disp: , Rfl:    Polyethyl Glycol-Propyl Glycol (SYSTANE OP), Place 1 Dose into both eyes daily as needed (dry eyes)., Disp: , Rfl:    spironolactone (ALDACTONE) 25 MG tablet, Take 25 mg by mouth daily., Disp: , Rfl:    triamcinolone cream (KENALOG) 0.5 %, Apply 1  application topically 2 (two) times daily as needed (rash)., Disp: , Rfl:    vitamin B-12 (CYANOCOBALAMIN) 1000 MCG tablet, Take 1,000 mcg by mouth daily., Disp: , Rfl:    Allergies  Allergen Reactions   Amoxicillin Other (See Comments)    Tongue swelling  Has patient had a PCN reaction causing immediate rash, facial/tongue/throat swelling, SOB or lightheadedness with hypotension: Unknown Has patient had a PCN reaction causing severe rash involving mucus membranes or skin necrosis: Unknown Has patient had a PCN reaction that required hospitalization: Unknown Has patient had a PCN reaction occurring within the last 10 years: Unknown If all of the above answers are "NO", then may proceed with Cephalosporin use.    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     Past Surgical History:  Procedure Laterality Date   CHOLECYSTECTOMY     MELANOMA EXCISION     x5    Family History  Problem Relation Age of Onset   Congestive Heart Failure Mother    Heart failure Mother    Heart attack Father  age 74   Heart Problems Brother    Cancer Brother    Cancer Brother        pancreatic    Cancer Sister        breast    Cancer Sister        breast   Breast cancer Neg Hx     Social History   Tobacco Use   Smoking status: Former    Types: Cigarettes    Quit date: 02/25/1988    Years since quitting: 32.7   Smokeless tobacco: Never  Vaping Use   Vaping Use: Never used  Substance Use Topics   Alcohol use: No   Drug use: No    ROS   Objective:   Vitals: BP (!) 163/96 (BP Location: Left Arm)   Pulse (!) 106   Temp 97.8 F (36.6 C) (Oral)   Resp 18   SpO2 98%   Physical Exam Constitutional:      General: She is not in acute distress.     Appearance: Normal appearance. She is well-developed. She is not ill-appearing, toxic-appearing or diaphoretic.  HENT:     Head: Normocephalic and atraumatic.     Nose: Nose normal.     Mouth/Throat:     Mouth: Mucous membranes are moist.     Pharynx: Oropharynx is clear.  Eyes:     General: No scleral icterus.    Extraocular Movements: Extraocular movements intact.     Pupils: Pupils are equal, round, and reactive to light.  Cardiovascular:     Rate and Rhythm: Regular rhythm. Tachycardia present.     Pulses: Normal pulses.     Heart sounds: Normal heart sounds. No murmur heard.   No friction rub. No gallop.  Pulmonary:     Effort: Pulmonary effort is normal. No respiratory distress.     Breath sounds: Normal breath sounds. No stridor. No wheezing, rhonchi or rales.  Skin:    General: Skin is warm and dry.     Findings: No rash.  Neurological:     General: No focal deficit present.     Mental Status: She is alert and oriented to person, place, and time.  Psychiatric:        Mood and Affect: Mood normal.        Behavior: Behavior normal.        Thought Content: Thought content normal.    ED ECG REPORT   Date: 11/15/2020  EKG Time: 3:45 PM  Rate: 119bpm  Rhythm: atrial fibrillation,  atrial fibrillation, rate 119 bpm  Axis: Normal  Intervals:none  ST&T Change: None  Narrative Interpretation: Atrial fibrillation with RVR at 119 bpm, poor R wave progression.  Comparable to previous EKG.  Recent Results (from the past 2160 hour(s))  Basic metabolic panel     Status: Abnormal   Collection Time: 10/22/20  5:44 PM  Result Value Ref Range   Sodium 134 (L) 135 - 145 mmol/L   Potassium 3.7 3.5 - 5.1 mmol/L   Chloride 99 98 - 111 mmol/L   CO2 24 22 - 32 mmol/L   Glucose, Bld 126 (H) 70 - 99 mg/dL    Comment: Glucose reference range applies only to samples taken after fasting for at least 8 hours.   BUN 10 8 - 23 mg/dL   Creatinine, Ser 0.69 0.44 - 1.00 mg/dL   Calcium 9.1  8.9 - 10.3 mg/dL   GFR, Estimated >60 >60 mL/min    Comment: (NOTE) Calculated using the  CKD-EPI Creatinine Equation (2021)    Anion gap 11 5 - 15    Comment: Performed at [facility], [address], [place], [place] [address]  CBC     Status: Abnormal   Collection Time: 10/22/20  5:44 PM  Result Value Ref Range   WBC 4.8 4.0 - 10.5 K/uL   RBC 3.08 (L) 3.87 - 5.11 MIL/uL   Hemoglobin 11.2 (L) 12.0 - 15.0 g/dL   HCT 31.7 (L) 36.0 - 46.0 %   MCV 102.9 (H) 80.0 - 100.0 fL   MCH 36.4 (H) 26.0 - 34.0 pg   MCHC 35.3 30.0 - 36.0 g/dL   RDW 14.7 11.5 - 15.5 %   Platelets 294 150 - 400 K/uL   nRBC 0.0 0.0 - 0.2 %    Comment: Performed at [facility], [address], [place], [place] [address]  Troponin I (High Sensitivity)     Status: None   Collection Time: 10/22/20  5:44 PM  Result Value Ref Range   Troponin I (High Sensitivity) 3 <18 ng/L    Comment: (NOTE) Elevated high sensitivity troponin I (hsTnI) values and significant  changes across serial measurements may suggest ACS but many other  chronic and acute conditions are known to elevate hsTnI results.  Refer to the "Links" section for chest pain algorithms and additional  guidance. Performed at [facility], [place], [place] [address]   Troponin I (High Sensitivity)     Status: None   Collection Time: 10/22/20  9:14 PM  Result Value Ref Range   Troponin I (High Sensitivity) 3 <18 ng/L    Comment: (NOTE) Elevated high sensitivity troponin I (hsTnI) values and significant  changes across serial measurements may suggest ACS but many other  chronic and acute conditions are known to elevate hsTnI results.  Refer to the "Links" section for chest pain algorithms and additional  guidance. Performed at [facility], [address], [place], [place] [address]   D-dimer, quantitative     Status: None   Collection Time: 10/22/20 10:46  PM  Result Value Ref Range   D-Dimer, Quant 0.38 0.00 - 0.50 ug/mL-FEU    Comment: (NOTE) At the manufacturer cut-off value of 0.5 g/mL FEU, this assay has a negative predictive value of 95-100%.This assay is intended for use in conjunction with a clinical pretest probability (PTP) assessment model to exclude pulmonary embolism (PE) and deep venous thrombosis (DVT) in outpatients suspected of PE or DVT. Results should be correlated with clinical presentation. Performed at [facility], [address], [place], [place] [address]   Magnesium     Status: None   Collection Time: 10/23/20  3:19 PM  Result Value Ref Range   Magnesium 2.0 1.7 - 2.4 mg/dL    Comment: Performed at [facility] [address] [place], [place] [address]  Phosphorus     Status: None   Collection Time: 10/23/20  3:19 PM  Result Value Ref Range   Phosphorus 2.9 2.5 - 4.6 mg/dL    Comment: Performed at [facility] [address] [place], [place] [address]  Basic metabolic panel     Status: Abnormal   Collection Time: 10/23/20  3:19 PM  Result Value Ref Range   Sodium 136 135 - 145 mmol/L   Potassium 3.3 (L) 3.5 - 5.1 mmol/L   Chloride 106 98 - 111 mmol/L   CO2 24 22 - 32 mmol/L   Glucose,  Bld 162 (H) 70 - 99 mg/dL    Comment: Glucose reference range applies only to samples taken after fasting for at least 8 hours.   BUN 6 (L) 8 - 23 mg/dL   Creatinine, Ser 0.65 0.44 - 1.00 mg/dL   Calcium 8.7 (L) 8.9 - 10.3 mg/dL   GFR, Estimated >60 >60 mL/min    Comment: (NOTE) Calculated using the CKD-EPI Creatinine Equation (2021)    Anion gap 6 5 - 15    Comment: Performed at [facility] [address], [place], [place] [address]  TSH     Status: None   Collection Time: 10/23/20  3:19 PM  Result Value Ref Range   TSH 1.149 0.350 - 4.500 uIU/mL    Comment: Performed by a 3rd Generation assay with a functional sensitivity of <=0.01 uIU/mL. Performed at [facility], [place] [address] [place], [place] [address]   Hemoglobin A1c     Status: None   Collection Time: 10/23/20 10:19 PM  Result Value Ref Range   Hgb A1c MFr Bld 5.6 4.8 - 5.6 %    Comment: (NOTE)         Prediabetes: 5.7 - 6.4         Diabetes: >6.4         Glycemic control for adults with diabetes: <7.0    Mean Plasma Glucose 114 mg/dL    Comment: (NOTE) Performed At: [facility] [place], [place] [id number] Rush Farmer MD DP:[id number]   Heparin level (unfractionated)     Status: None   Collection Time: 10/24/20 12:33 AM  Result Value Ref Range   Heparin Unfractionated 0.36 0.30 - 0.70 IU/mL    Comment: (NOTE) The clinical reportable range upper limit is being lowered to >1.10 to align with the FDA approved guidance for the current laboratory assay.  If heparin results are below expected values, and patient dosage has  been confirmed, suggest follow up testing of antithrombin III levels. Performed at [facility], [place] [address], [place], [place] [address]   CBC     Status: Abnormal   Collection Time: 10/24/20 12:33 AM  Result Value Ref Range   WBC 5.0 4.0 - 10.5 K/uL   RBC 2.99 (L) 3.87 - 5.11 MIL/uL   Hemoglobin 10.8 (L) 12.0 - 15.0 g/dL   HCT 31.3 (L) 36.0 - 46.0 %   MCV 104.7 (H) 80.0 - 100.0 fL   MCH 36.1 (H) 26.0 - 34.0 pg   MCHC 34.5 30.0 - 36.0 g/dL   RDW 14.5 11.5 - 15.5 %   Platelets 317 150 - 400 K/uL   nRBC 0.0 0.0 - 0.2 %    Comment: Performed at [facility], [place] [address] [place], [place] [address]  ECHOCARDIOGRAM COMPLETE     Status: None   Collection Time: 10/24/20 10:54 AM  Result Value Ref Range   Weight 2,080 oz   Height 63 in   BP 150/62 mmHg   Single Plane A2C EF 69.6 %   Single Plane A4C EF 63.9 %   Calc EF 66.0 %   S' Lateral 2.20 cm   Area-P 1/2 2.88 cm2   MV VTI 0.85 cm2  CBC     Status: Abnormal   Collection Time: 10/25/20  2:57 AM  Result Value Ref Range   WBC 4.7 4.0 - 10.5 K/uL    RBC 2.88 (L) 3.87 - 5.11 MIL/uL   Hemoglobin 10.6 (L)  12.0 - 15.0 g/dL   HCT 30.0 (L) 36.0 - 46.0 %   MCV 104.2 (H) 80.0 - 100.0 fL   MCH 36.8 (H) 26.0 - 34.0 pg   MCHC 35.3 30.0 - 36.0 g/dL   RDW 14.5 11.5 - 15.5 %   Platelets 293 150 - 400 K/uL   nRBC 0.0 0.0 - 0.2 %    Comment: Performed at [facility] [address], [place], [place] [address]  Basic metabolic panel     Status: Abnormal   Collection Time: 10/25/20  2:57 AM  Result Value Ref Range   Sodium 136 135 - 145 mmol/L   Potassium 3.9 3.5 - 5.1 mmol/L   Chloride 104 98 - 111 mmol/L   CO2 26 22 - 32 mmol/L   Glucose, Bld 139 (H) 70 - 99 mg/dL    Comment: Glucose reference range applies only to samples taken after fasting for at least 8 hours.   BUN 8 8 - 23 mg/dL   Creatinine, Ser 0.71 0.44 - 1.00 mg/dL   Calcium 8.9 8.9 - 10.3 mg/dL   GFR, Estimated >60 >60 mL/min    Comment: (NOTE) Calculated using the CKD-EPI Creatinine Equation (2021)    Anion gap 6 5 - 15    Comment: Performed at [facility] [address], [place], [place] [address]   DG Chest 2 View  Result Date: 10/22/2020 CLINICAL DATA:  Weakness and shortness of breath. EXAM: CHEST - 2 VIEW COMPARISON:  January 05, 2018 FINDINGS: There is no evidence of acute infiltrate, pleural effusion or pneumothorax. The heart size and mediastinal contours are within normal limits. There is marked severity calcification of the aortic arch. Radiopaque surgical clips are seen overlying the right upper quadrant. The visualized skeletal structures are unremarkable. IMPRESSION: No active cardiopulmonary disease. Electronically Signed   By: Virgina Norfolk M.D.   On: 10/22/2020 18:32   ECHOCARDIOGRAM COMPLETE  Result Date: 10/24/2020    ECHOCARDIOGRAM REPORT   Patient Name:   Kristi Montgomery Date of Exam: 10/24/2020 Medical Rec #:  [id number]        Height:       63.0 in Accession #:    [id number]       Weight:       130.0 lb Date of Birth:  26-Feb-1930        BSA:           1.610 m Patient Age:    38 years         BP:           150/62 mmHg Patient Gender: F                HR:           91 bpm. Exam Location:  Inpatient Procedure: 2D Echo, 3D Echo, Color Doppler and Cardiac Doppler Indications:    I48.91* Unspeicified atrial fibrillation  History:        Patient has no prior history of Echocardiogram examinations.                 Abnormal ECG, Arrythmias:Atrial Fibrillation,                 Signs/Symptoms:Chest Pain; Risk Factors:Hypertension. Covid                 infection two weeks prior.  Sonographer:    Roseanna Rainbow RDCS Referring Phys: [id number] [place]  1. Left ventricular ejection  fraction, by estimation, is 60 to 65%. The left ventricle has normal function. The left ventricle has no regional wall motion abnormalities. Left ventricular diastolic function could not be evaluated.  2. Right ventricular systolic function is normal. The right ventricular size is normal. There is normal pulmonary artery systolic pressure.  3. Left atrial size was mildly dilated.  4. The mitral valve is normal in structure. Trivial mitral valve regurgitation.  5. Tricuspid valve regurgitation is moderate.  6. The aortic valve is tricuspid. There is mild calcification of the aortic valve. There is mild thickening of the aortic valve. Aortic valve regurgitation is not visualized. Mild to moderate aortic valve sclerosis/calcification is present, without any evidence of aortic stenosis.  7. The inferior vena cava is dilated in size with >50% respiratory variability, suggesting right atrial pressure of 8 mmHg. Comparison(s): No prior Echocardiogram. Conclusion(s)/Recommendation(s): Otherwise normal echocardiogram, with minor abnormalities described in the report. FINDINGS  Left Ventricle: Left ventricular ejection fraction, by estimation, is 60 to 65%. The left ventricle has normal function. The left ventricle has no regional wall motion abnormalities. The left ventricular internal  cavity size was small. There is borderline left ventricular hypertrophy. Left ventricular diastolic function could not be evaluated due to atrial fibrillation. Left ventricular diastolic function could not be evaluated. Right Ventricle: The right ventricular size is normal. Right vetricular wall thickness was not well visualized. Right ventricular systolic function is normal. There is normal pulmonary artery systolic pressure. The tricuspid regurgitant velocity is 2.61 m/s, and with an assumed right atrial pressure of 8 mmHg, the estimated right ventricular systolic pressure is 81.1 mmHg. Left Atrium: Left atrial size was mildly dilated. Right Atrium: Right atrial size was normal in size. Pericardium: There is no evidence of pericardial effusion. Presence of pericardial fat pad. Mitral Valve: The mitral valve is normal in structure. There is mild thickening of the mitral valve leaflet(s). There is mild calcification of the mitral valve leaflet(s). Mild to moderate mitral annular calcification. Trivial mitral valve regurgitation.  MV peak gradient, 11.4 mmHg. The mean mitral valve gradient is 3.0 mmHg. Tricuspid Valve: The tricuspid valve is normal in structure. Tricuspid valve regurgitation is moderate . No evidence of tricuspid stenosis. Aortic Valve: The aortic valve is tricuspid. There is mild calcification of the aortic valve. There is mild thickening of the aortic valve. Aortic valve regurgitation is not visualized. Mild to moderate aortic valve sclerosis/calcification is present, without any evidence of aortic stenosis. Pulmonic Valve: The pulmonic valve was not well visualized. Pulmonic valve regurgitation is not visualized. No evidence of pulmonic stenosis. Aorta: The aortic root, ascending aorta and aortic arch are all structurally normal, with no evidence of dilitation or obstruction. Venous: The inferior vena cava is dilated in size with greater than 50% respiratory variability, suggesting right atrial  pressure of 8 mmHg. IAS/Shunts: The atrial septum is grossly normal.  LEFT VENTRICLE PLAX 2D LVIDd:         2.95 cm LVIDs:         2.20 cm LV PW:         1.20 cm LV IVS:        1.10 cm LVOT diam:     1.50 cm     3D Volume EF: LV SV:         28          3D EF:        60 % LV SV Index:   17  LV EDV:       74 ml LVOT Area:     1.77 cm    LV ESV:       29 ml                            LV SV:        44 ml  LV Volumes (MOD) LV vol d, MOD A2C: 37.8 ml LV vol d, MOD A4C: 46.5 ml LV vol s, MOD A2C: 11.5 ml LV vol s, MOD A4C: 16.8 ml LV SV MOD A2C:     26.3 ml LV SV MOD A4C:     46.5 ml LV SV MOD BP:      27.8 ml RIGHT VENTRICLE            IVC RV S prime:     6.74 cm/s  IVC diam: 2.20 cm TAPSE (M-mode): 1.6 cm LEFT ATRIUM             Index       RIGHT ATRIUM           Index LA diam:        3.40 cm 2.11 cm/m  RA Area:     13.70 cm LA Vol (A2C):   38.8 ml 24.10 ml/m RA Volume:   30.90 ml  19.19 ml/m LA Vol (A4C):   48.4 ml 30.06 ml/m LA Biplane Vol: 43.5 ml 27.02 ml/m  AORTIC VALVE LVOT Vmax:   89.75 cm/s LVOT Vmean:  62.150 cm/s LVOT VTI:    0.156 m  AORTA Ao Root diam: 2.60 cm Ao Asc diam:  2.90 cm MITRAL VALVE                TRICUSPID VALVE MV Area (PHT): 2.88 cm     TR Peak grad:   27.2 mmHg MV Area VTI:   0.85 cm     TR Vmax:        261.00 cm/s MV Peak grad:  11.4 mmHg MV Mean grad:  3.0 mmHg     SHUNTS MV Vmax:       1.69 m/s     Systemic VTI:  0.16 m MV Vmean:      69.2 cm/s    Systemic Diam: 1.50 cm MV Decel Time: 263 msec MV E velocity: 148.17 cm/s Buford Dresser MD Electronically signed by Buford Dresser MD Signature Date/Time: 10/24/2020/2:23:32 PM    Final      Assessment and Plan :   PDMP not reviewed this encounter.  1. Atrial fibrillation with RVR ([place])   2. Atypical chest pain   3. Mild headache   4. Essential hypertension     Attempted to contact cardiology on call through [facility] but was not able to reach the providers. Recommended further evaluation and intervention in  the hospital setting through the emergency room.  Patient has severely uncontrolled atrial fibrillation and a pulse ranging as we see it on our monitor between 134bpm-160bpm.  I cannot advised that she wait until her cardiology appointment on 12/13/2020.  Patient presents with her husband who is agreeable to drive her there.  We discussed going by EMS but as she does not have any active chest pain, shortness of breath and this is not new in onset as it was discovered 10/22/2020 we will have the patient go by personal vehicle.   Jaynee Eagles, [place] 11/15/20 [phone]

## 2020-11-20 ENCOUNTER — Other Ambulatory Visit: Payer: Self-pay | Admitting: Internal Medicine

## 2020-11-20 DIAGNOSIS — Z1231 Encounter for screening mammogram for malignant neoplasm of breast: Secondary | ICD-10-CM

## 2020-11-22 ENCOUNTER — Ambulatory Visit: Payer: Medicare Other | Admitting: Cardiology

## 2020-11-22 ENCOUNTER — Encounter: Payer: Self-pay | Admitting: Cardiology

## 2020-11-22 ENCOUNTER — Other Ambulatory Visit: Payer: Self-pay

## 2020-11-22 VITALS — BP 136/74 | HR 83 | Temp 98.2°F | Ht 63.0 in | Wt 129.0 lb

## 2020-11-22 DIAGNOSIS — I10 Essential (primary) hypertension: Secondary | ICD-10-CM

## 2020-11-22 DIAGNOSIS — I4891 Unspecified atrial fibrillation: Secondary | ICD-10-CM

## 2020-11-22 DIAGNOSIS — R5383 Other fatigue: Secondary | ICD-10-CM

## 2020-11-22 DIAGNOSIS — R0989 Other specified symptoms and signs involving the circulatory and respiratory systems: Secondary | ICD-10-CM

## 2020-11-22 NOTE — Progress Notes (Signed)
Primary Physician/Referring:  Shon Baton, MD  Patient ID: Kristi Montgomery, female    DOB: 07-06-1930, 87 y.o.   MRN: [id number]  Chief Complaint  Patient presents with   Atrial Fibrillation   Coronary Artery Disease   Hospitalization Follow-up   HPI:    Kristi Montgomery  is a 87 y.o. Caucasian female with history of hypertension, chronic iron deficiency anemia, anxiety, and GERD, as well as history of tobacco use (quit in 2000) and family history of premature CAD.  She also has history of COVID-19 infection in early 09/2020.   Patient was admitted to the hospital 10/23/2020 - 10/25/2020 with complaints of new onset palpitations and generalized weakness at which time EKG revealed patient to be in A. fib with RVR.  Patient was started on Eliquis given CHA2DS2-VASc score of 4 and discharged with metoprolol.  She then presented to the ED 11/15/2020 again with atrial fibrillation with RVR, at this time she was advised to switch from Lopressor to diltiazem, however patient has been both Lopressor and diltiazem.  She now presents to our office for further evaluation and management of atrial fibrillation.  Patient continues to experience fatigue, although her dizziness and lightheadedness have improved.  She is tolerating Eliquis without bleeding diathesis.  Denies chest pain, palpitations, syncope, near syncope.  Patient is accompanied by her husband at bedside for today's office visit.  Past Medical History:  Diagnosis Date   Anxiety    Cancer ([place])    Melanoma   Cataract    Chest pain    myoview 04/01/12-normal   History of tobacco abuse    HTN (hypertension)    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/06/2017   Past Surgical History:  Procedure Laterality Date   CHOLECYSTECTOMY     MELANOMA EXCISION     x5   Family History  Problem Relation Age of Onset   Congestive Heart Failure Mother    Heart failure Mother    Heart attack Father        age 80   Heart Problems Brother    Cancer Brother    Cancer  Brother        pancreatic    Cancer Sister        breast    Cancer Sister        breast   Breast cancer Neg Hx     Social History   Tobacco Use   Smoking status: Former    Types: Cigarettes    Quit date: 02/25/1988    Years since quitting: 32.7   Smokeless tobacco: Never  Substance Use Topics   Alcohol use: No   Marital Status: Married   ROS  Review of Systems  Constitutional: Positive for malaise/fatigue. Negative for weight gain.  Cardiovascular:  Negative for chest pain, claudication, leg swelling, near-syncope, orthopnea, palpitations, paroxysmal nocturnal dyspnea and syncope.  Respiratory:  Negative for shortness of breath.   Neurological:  Negative for dizziness.   Objective  Blood pressure 136/74, pulse 83, temperature 98.2 F (36.8 C), height 5\' 3"  (1.6 m), weight 129 lb (58.5 kg), SpO2 98 %.  Vitals with BMI 11/22/2020 11/15/2020 11/15/2020  Height 5\' 3"  - -  Weight 129 lbs - -  BMI 41.74 - -  Systolic 081 448 185  Diastolic 74 76 98  Pulse 83 87 85      Physical Exam Vitals reviewed.  HENT:     Head: Normocephalic and atraumatic.  Cardiovascular:     Rate and Rhythm: Normal rate.  Rhythm irregular.     Pulses: Intact distal pulses.          Carotid pulses are  on the left side with bruit.    Heart sounds: S1 normal and S2 normal. No murmur heard.   No gallop.  Pulmonary:     Effort: Pulmonary effort is normal. No respiratory distress.     Breath sounds: No wheezing, rhonchi or rales.  Musculoskeletal:     Right lower leg: Edema (trace) present.     Left lower leg: Edema (trace) present.  Neurological:     Mental Status: She is alert.    Laboratory examination:   Recent Labs    10/23/20 1519 10/25/20 0257 11/15/20 1721  NA 136 136 134*  K 3.3* 3.9 4.0  CL 106 104 101  CO2 24 26 25   GLUCOSE 162* 139* 186*  BUN 6* 8 13  CREATININE 0.65 0.71 0.85  CALCIUM 8.7* 8.9 9.0  GFRNONAA >60 >60 >60   estimated creatinine clearance is 37.8 mL/min (by  C-G formula based on SCr of 0.85 mg/dL).  CMP Latest Ref Rng & Units 11/15/2020 10/25/2020 10/23/2020  Glucose 70 - 99 mg/dL 186(H) 139(H) 162(H)  BUN 8 - 23 mg/dL 13 8 6(L)  Creatinine 0.44 - 1.00 mg/dL 0.85 0.71 0.65  Sodium 135 - 145 mmol/L 134(L) 136 136  Potassium 3.5 - 5.1 mmol/L 4.0 3.9 3.3(L)  Chloride 98 - 111 mmol/L 101 104 106  CO2 22 - 32 mmol/L 25 26 24   Calcium 8.9 - 10.3 mg/dL 9.0 8.9 8.7(L)  Total Protein 6.0 - 8.5 g/dL - - -  Total Bilirubin 0.0 - 1.2 mg/dL - - -  Alkaline Phos 39 - 117 IU/L - - -  AST 0 - 40 IU/L - - -  ALT 0 - 32 IU/L - - -   CBC Latest Ref Rng & Units 11/15/2020 10/25/2020 10/24/2020  WBC 4.0 - 10.5 K/uL 7.2 4.7 5.0  Hemoglobin 12.0 - 15.0 g/dL 11.4(L) 10.6(L) 10.8(L)  Hematocrit 36.0 - 46.0 % 32.9(L) 30.0(L) 31.3(L)  Platelets 150 - 400 K/uL 250 293 317    Lipid Panel No results for input(s): CHOL, TRIG, LDLCALC, VLDL, HDL, CHOLHDL, LDLDIRECT in the last 8760 hours.  HEMOGLOBIN A1C Lab Results  Component Value Date   HGBA1C 5.6 10/23/2020   MPG 114 10/23/2020   TSH Recent Labs    10/23/20 1519  TSH 1.149    External labs:  None   Allergies   Allergies  Allergen Reactions   Amoxicillin Swelling and Other (See Comments)    Tongue swelling  Has patient had a PCN reaction causing immediate rash, facial/tongue/throat swelling, SOB or lightheadedness with hypotension: Yes Has patient had a PCN reaction causing severe rash involving mucus membranes or skin necrosis: Unknown Has patient had a PCN reaction that required hospitalization: Unknown Has patient had a PCN reaction occurring within the last 10 years: Unknown If all of the above answers are "NO", then may proceed with Cephalosporin use.    Amoxicillin-Pot Clavulanate Swelling and Other (See Comments)    Swollen and red tongue   Codeine Nausea And Vomiting   Latex Rash and Other (See Comments)    NO BAND-AIDS!!!!   Tape Rash and Other (See Comments)    NO BAND-AIDS OR TAPE!!!!     Medications Prior to Visit:   Outpatient Medications Prior to Visit  Medication Sig Dispense Refill   ALPRAZolam (XANAX) 0.25 MG tablet Take 0.125 mg by mouth 2 (two)  times daily as needed for anxiety.     apixaban (ELIQUIS) 2.5 MG TABS tablet Take 1 tablet (2.5 mg total) by mouth 2 (two) times daily. 60 tablet 0   Cholecalciferol (VITAMIN D-3) 25 MCG (1000 UT) CAPS Take 1,000 Units by mouth daily.     diltiazem (CARDIZEM) 60 MG tablet Take 1 tablet (60 mg total) by mouth 3 (three) times daily. (Patient taking differently: Take 30 mg by mouth 3 (three) times daily.) 180 tablet 0   ferrous sulfate 325 (65 FE) MG tablet Take 325 mg by mouth every other day.     metoprolol tartrate (LOPRESSOR) 50 MG tablet Take 50 mg by mouth 2 (two) times daily.     omeprazole (PRILOSEC) 40 MG capsule Take 40 mg by mouth daily as needed (heartburn).     SYSTANE ULTRA PF 0.4-0.3 % SOLN Place 1 drop into both eyes in the morning, at noon, in the evening, and at bedtime.     vitamin B-12 (CYANOCOBALAMIN) 1000 MCG tablet Take 1,000 mcg by mouth daily.     zolpidem (AMBIEN) 10 MG tablet Take 0.5 tablets by mouth at bedtime as needed.     MAGNESIUM PO Take 1 tablet by mouth daily.     Olopatadine HCl (PATADAY OP) Place 1 drop into both eyes daily at 12 noon.     REFRESH OPTIVE ADVANCED PF 0.5-1-0.5 % SOLN Place 1 drop into both eyes at bedtime.     spironolactone (ALDACTONE) 25 MG tablet Take 25 mg by mouth daily. (Patient not taking: Reported on 11/22/2020)     VITAMIN E PO Take 1 capsule by mouth daily.     diltiazem (CARDIZEM) 60 MG tablet See admin instructions.     No facility-administered medications prior to visit.   Final Medications at End of Visit    Current Meds  Medication Sig   ALPRAZolam (XANAX) 0.25 MG tablet Take 0.125 mg by mouth 2 (two) times daily as needed for anxiety.   apixaban (ELIQUIS) 2.5 MG TABS tablet Take 1 tablet (2.5 mg total) by mouth 2 (two) times daily.   Cholecalciferol  (VITAMIN D-3) 25 MCG (1000 UT) CAPS Take 1,000 Units by mouth daily.   diltiazem (CARDIZEM) 60 MG tablet Take 1 tablet (60 mg total) by mouth 3 (three) times daily. (Patient taking differently: Take 30 mg by mouth 3 (three) times daily.)   ferrous sulfate 325 (65 FE) MG tablet Take 325 mg by mouth every other day.   metoprolol tartrate (LOPRESSOR) 50 MG tablet Take 50 mg by mouth 2 (two) times daily.   omeprazole (PRILOSEC) 40 MG capsule Take 40 mg by mouth daily as needed (heartburn).   SYSTANE ULTRA PF 0.4-0.3 % SOLN Place 1 drop into both eyes in the morning, at noon, in the evening, and at bedtime.   vitamin B-12 (CYANOCOBALAMIN) 1000 MCG tablet Take 1,000 mcg by mouth daily.   zolpidem (AMBIEN) 10 MG tablet Take 0.5 tablets by mouth at bedtime as needed.   Radiology:   No results found.  Cardiac Studies:   Echocardiogram 10/23/2020:   1. Left ventricular ejection fraction, by estimation, is 60 to 65%. The  left ventricle has normal function. The left ventricle has no regional  wall motion abnormalities. Left ventricular diastolic function could not  be evaluated.   2. Right ventricular systolic function is normal. The right ventricular  size is normal. There is normal pulmonary artery systolic pressure.   3. Left atrial size was mildly dilated.  4. The mitral valve is normal in structure. Trivial mitral valve  regurgitation.   5. Tricuspid valve regurgitation is moderate.   6. The aortic valve is tricuspid. There is mild calcification of the  aortic valve. There is mild thickening of the aortic valve. Aortic valve  regurgitation is not visualized. Mild to moderate aortic valve  sclerosis/calcification is present, without any  evidence of aortic stenosis.   7. The inferior vena cava is dilated in size with >50% respiratory  variability, suggesting right atrial pressure of 8 mmHg.   EKG:   EKG 11/22/2020: Atrial fibrillation with controlled ventricular sponsor rate of 94 bpm.   Normal axis.  No evidence of ischemia or underlying injury pattern.  Assessment     ICD-10-CM   1. Atrial fibrillation with rapid ventricular response (HCC)  I48.91 EKG 12-Lead    PCV MYOCARDIAL PERFUSION WITH LEXISCAN    2. Essential hypertension  I10     3. Left carotid bruit  R09.89 PCV CAROTID DUPLEX (BILATERAL)    4. Other fatigue  R53.83 PCV MYOCARDIAL PERFUSION WITH LEXISCAN       Medications Discontinued During This Encounter  Medication Reason   diltiazem (CARDIZEM) 60 MG tablet Error    No orders of the defined types were placed in this encounter.  This patients CHA2DS2-VASc Score 4 (HTN, A, F) and yearly risk of stroke 4.8%.    Recommendations:   Kristi Montgomery is a 87 y.o. Caucasian female with history of hypertension, chronic iron deficiency anemia, anxiety, and GERD, as well as history of tobacco use (quit in 2000) and family history of premature CAD.  She also has history of COVID-19 infection in early 09/2020.   Patient was admitted to the hospital 10/23/2020 - 10/25/2020 with complaints of new onset palpitations and generalized weakness at which time EKG revealed patient to be in A. fib with RVR.  Patient was started on Eliquis given CHA2DS2-VASc score of 4 and discharged with metoprolol.  She then presented to the ED 11/15/2020 again with atrial fibrillation with RVR, at this time she was advised to switch from Lopressor to diltiazem, however patient has been both Lopressor and diltiazem.  She now presents to our office for further evaluation and management of atrial fibrillation.  Patient remains in atrial fibrillation, which is well rate controlled.  She remains symptomatic with fatigue.  She has not been on Eliquis for approximately 4 weeks.  Given continued symptomatic atrial fibrillation discussed with patient management options including cardioversion.  Reviewed with patient and her husband indication, risk, benefits of direct-current cardioversion, she verbalized  understanding and wishes to proceed with the procedure.  We will also obtain nuclear stress test to evaluate for underlying ischemic etiology of cardiac arrhythmia.  However given peripheral neuropathy patient is not a candidate for treadmill stress test.  On physical exam patient has left carotid artery bruit, will obtain carotid artery duplex.  Patient's blood pressure was initially elevated in the office, however it improved upon recheck.  Could consider restarting Aldactone 25 mg daily in the future if blood pressure remains elevated.  Follow up and further recommendations pending cardiovascular work-up.  Patient was seen in collaboration with Dr. Einar Gip. He also reviewed patient's chart and examined the patient. Dr. Einar Gip is in agreement of the plan.   During this visit I reviewed and updated: Tobacco history  allergies medication reconciliation  medical history  surgical history  family history  social history.    Alethia Berthold, PA-C 11/22/2020, 4:17  PM Office: [phone]

## 2020-11-22 NOTE — H&P (View-Only) (Signed)
Primary Physician/Referring:  Shon Baton, MD  Patient ID: Kristi Montgomery, female    DOB: 1930/10/17, 87 y.o.   MRN: [id number]  Chief Complaint  Patient presents with   Atrial Fibrillation   Coronary Artery Disease   Hospitalization Follow-up   HPI:    Kristi Montgomery  is a 87 y.o. Caucasian female with history of hypertension, chronic iron deficiency anemia, anxiety, and GERD, as well as history of tobacco use (quit in 2000) and family history of premature CAD.  She also has history of COVID-19 infection in early 09/2020.   Patient was admitted to the hospital 10/23/2020 - 10/25/2020 with complaints of new onset palpitations and generalized weakness at which time EKG revealed patient to be in A. fib with RVR.  Patient was started on Eliquis given CHA2DS2-VASc score of 4 and discharged with metoprolol.  She then presented to the ED 11/15/2020 again with atrial fibrillation with RVR, at this time she was advised to switch from Lopressor to diltiazem, however patient has been both Lopressor and diltiazem.  She now presents to our office for further evaluation and management of atrial fibrillation.  Patient continues to experience fatigue, although her dizziness and lightheadedness have improved.  She is tolerating Eliquis without bleeding diathesis.  Denies chest pain, palpitations, syncope, near syncope.  Patient is accompanied by her husband at bedside for today's office visit.  Past Medical History:  Diagnosis Date   Anxiety    Cancer ([place])    Melanoma   Cataract    Chest pain    myoview 04/01/12-normal   History of tobacco abuse    HTN (hypertension)    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/06/2017   Past Surgical History:  Procedure Laterality Date   CHOLECYSTECTOMY     MELANOMA EXCISION     x5   Family History  Problem Relation Age of Onset   Congestive Heart Failure Mother    Heart failure Mother    Heart attack Father        age 52   Heart Problems Brother    Cancer Brother    Cancer  Brother        pancreatic    Cancer Sister        breast    Cancer Sister        breast   Breast cancer Neg Hx     Social History   Tobacco Use   Smoking status: Former    Types: Cigarettes    Quit date: 02/25/1988    Years since quitting: 32.7   Smokeless tobacco: Never  Substance Use Topics   Alcohol use: No   Marital Status: Married   ROS  Review of Systems  Constitutional: Positive for malaise/fatigue. Negative for weight gain.  Cardiovascular:  Negative for chest pain, claudication, leg swelling, near-syncope, orthopnea, palpitations, paroxysmal nocturnal dyspnea and syncope.  Respiratory:  Negative for shortness of breath.   Neurological:  Negative for dizziness.   Objective  Blood pressure 136/74, pulse 83, temperature 98.2 F (36.8 C), height 5\' 3"  (1.6 m), weight 129 lb (58.5 kg), SpO2 98 %.  Vitals with BMI 11/22/2020 11/15/2020 11/15/2020  Height 5\' 3"  - -  Weight 129 lbs - -  BMI 26.20 - -  Systolic 355 974 163  Diastolic 74 76 98  Pulse 83 87 85      Physical Exam Vitals reviewed.  HENT:     Head: Normocephalic and atraumatic.  Cardiovascular:     Rate and Rhythm: Normal rate.  Rhythm irregular.     Pulses: Intact distal pulses.          Carotid pulses are  on the left side with bruit.    Heart sounds: S1 normal and S2 normal. No murmur heard.   No gallop.  Pulmonary:     Effort: Pulmonary effort is normal. No respiratory distress.     Breath sounds: No wheezing, rhonchi or rales.  Musculoskeletal:     Right lower leg: Edema (trace) present.     Left lower leg: Edema (trace) present.  Neurological:     Mental Status: She is alert.    Laboratory examination:   Recent Labs    10/23/20 1519 10/25/20 0257 11/15/20 1721  NA 136 136 134*  K 3.3* 3.9 4.0  CL 106 104 101  CO2 24 26 25   GLUCOSE 162* 139* 186*  BUN 6* 8 13  CREATININE 0.65 0.71 0.85  CALCIUM 8.7* 8.9 9.0  GFRNONAA >60 >60 >60   estimated creatinine clearance is 37.8 mL/min (by  C-G formula based on SCr of 0.85 mg/dL).  CMP Latest Ref Rng & Units 11/15/2020 10/25/2020 10/23/2020  Glucose 70 - 99 mg/dL 186(H) 139(H) 162(H)  BUN 8 - 23 mg/dL 13 8 6(L)  Creatinine 0.44 - 1.00 mg/dL 0.85 0.71 0.65  Sodium 135 - 145 mmol/L 134(L) 136 136  Potassium 3.5 - 5.1 mmol/L 4.0 3.9 3.3(L)  Chloride 98 - 111 mmol/L 101 104 106  CO2 22 - 32 mmol/L 25 26 24   Calcium 8.9 - 10.3 mg/dL 9.0 8.9 8.7(L)  Total Protein 6.0 - 8.5 g/dL - - -  Total Bilirubin 0.0 - 1.2 mg/dL - - -  Alkaline Phos 39 - 117 IU/L - - -  AST 0 - 40 IU/L - - -  ALT 0 - 32 IU/L - - -   CBC Latest Ref Rng & Units 11/15/2020 10/25/2020 10/24/2020  WBC 4.0 - 10.5 K/uL 7.2 4.7 5.0  Hemoglobin 12.0 - 15.0 g/dL 11.4(L) 10.6(L) 10.8(L)  Hematocrit 36.0 - 46.0 % 32.9(L) 30.0(L) 31.3(L)  Platelets 150 - 400 K/uL 250 293 317    Lipid Panel No results for input(s): CHOL, TRIG, LDLCALC, VLDL, HDL, CHOLHDL, LDLDIRECT in the last 8760 hours.  HEMOGLOBIN A1C Lab Results  Component Value Date   HGBA1C 5.6 10/23/2020   MPG 114 10/23/2020   TSH Recent Labs    10/23/20 1519  TSH 1.149    External labs:  None   Allergies   Allergies  Allergen Reactions   Amoxicillin Swelling and Other (See Comments)    Tongue swelling  Has patient had a PCN reaction causing immediate rash, facial/tongue/throat swelling, SOB or lightheadedness with hypotension: Yes Has patient had a PCN reaction causing severe rash involving mucus membranes or skin necrosis: Unknown Has patient had a PCN reaction that required hospitalization: Unknown Has patient had a PCN reaction occurring within the last 10 years: Unknown If all of the above answers are "NO", then may proceed with Cephalosporin use.    Amoxicillin-Pot Clavulanate Swelling and Other (See Comments)    Swollen and red tongue   Codeine Nausea And Vomiting   Latex Rash and Other (See Comments)    NO BAND-AIDS!!!!   Tape Rash and Other (See Comments)    NO BAND-AIDS OR TAPE!!!!     Medications Prior to Visit:   Outpatient Medications Prior to Visit  Medication Sig Dispense Refill   ALPRAZolam (XANAX) 0.25 MG tablet Take 0.125 mg by mouth 2 (two)  times daily as needed for anxiety.     apixaban (ELIQUIS) 2.5 MG TABS tablet Take 1 tablet (2.5 mg total) by mouth 2 (two) times daily. 60 tablet 0   Cholecalciferol (VITAMIN D-3) 25 MCG (1000 UT) CAPS Take 1,000 Units by mouth daily.     diltiazem (CARDIZEM) 60 MG tablet Take 1 tablet (60 mg total) by mouth 3 (three) times daily. (Patient taking differently: Take 30 mg by mouth 3 (three) times daily.) 180 tablet 0   ferrous sulfate 325 (65 FE) MG tablet Take 325 mg by mouth every other day.     metoprolol tartrate (LOPRESSOR) 50 MG tablet Take 50 mg by mouth 2 (two) times daily.     omeprazole (PRILOSEC) 40 MG capsule Take 40 mg by mouth daily as needed (heartburn).     SYSTANE ULTRA PF 0.4-0.3 % SOLN Place 1 drop into both eyes in the morning, at noon, in the evening, and at bedtime.     vitamin B-12 (CYANOCOBALAMIN) 1000 MCG tablet Take 1,000 mcg by mouth daily.     zolpidem (AMBIEN) 10 MG tablet Take 0.5 tablets by mouth at bedtime as needed.     MAGNESIUM PO Take 1 tablet by mouth daily.     Olopatadine HCl (PATADAY OP) Place 1 drop into both eyes daily at 12 noon.     REFRESH OPTIVE ADVANCED PF 0.5-1-0.5 % SOLN Place 1 drop into both eyes at bedtime.     spironolactone (ALDACTONE) 25 MG tablet Take 25 mg by mouth daily. (Patient not taking: Reported on 11/22/2020)     VITAMIN E PO Take 1 capsule by mouth daily.     diltiazem (CARDIZEM) 60 MG tablet See admin instructions.     No facility-administered medications prior to visit.   Final Medications at End of Visit    Current Meds  Medication Sig   ALPRAZolam (XANAX) 0.25 MG tablet Take 0.125 mg by mouth 2 (two) times daily as needed for anxiety.   apixaban (ELIQUIS) 2.5 MG TABS tablet Take 1 tablet (2.5 mg total) by mouth 2 (two) times daily.   Cholecalciferol  (VITAMIN D-3) 25 MCG (1000 UT) CAPS Take 1,000 Units by mouth daily.   diltiazem (CARDIZEM) 60 MG tablet Take 1 tablet (60 mg total) by mouth 3 (three) times daily. (Patient taking differently: Take 30 mg by mouth 3 (three) times daily.)   ferrous sulfate 325 (65 FE) MG tablet Take 325 mg by mouth every other day.   metoprolol tartrate (LOPRESSOR) 50 MG tablet Take 50 mg by mouth 2 (two) times daily.   omeprazole (PRILOSEC) 40 MG capsule Take 40 mg by mouth daily as needed (heartburn).   SYSTANE ULTRA PF 0.4-0.3 % SOLN Place 1 drop into both eyes in the morning, at noon, in the evening, and at bedtime.   vitamin B-12 (CYANOCOBALAMIN) 1000 MCG tablet Take 1,000 mcg by mouth daily.   zolpidem (AMBIEN) 10 MG tablet Take 0.5 tablets by mouth at bedtime as needed.   Radiology:   No results found.  Cardiac Studies:   Echocardiogram 10/23/2020:   1. Left ventricular ejection fraction, by estimation, is 60 to 65%. The  left ventricle has normal function. The left ventricle has no regional  wall motion abnormalities. Left ventricular diastolic function could not  be evaluated.   2. Right ventricular systolic function is normal. The right ventricular  size is normal. There is normal pulmonary artery systolic pressure.   3. Left atrial size was mildly dilated.  4. The mitral valve is normal in structure. Trivial mitral valve  regurgitation.   5. Tricuspid valve regurgitation is moderate.   6. The aortic valve is tricuspid. There is mild calcification of the  aortic valve. There is mild thickening of the aortic valve. Aortic valve  regurgitation is not visualized. Mild to moderate aortic valve  sclerosis/calcification is present, without any  evidence of aortic stenosis.   7. The inferior vena cava is dilated in size with >50% respiratory  variability, suggesting right atrial pressure of 8 mmHg.   EKG:   EKG 11/22/2020: Atrial fibrillation with controlled ventricular sponsor rate of 94 bpm.   Normal axis.  No evidence of ischemia or underlying injury pattern.  Assessment     ICD-10-CM   1. Atrial fibrillation with rapid ventricular response (HCC)  I48.91 EKG 12-Lead    PCV MYOCARDIAL PERFUSION WITH LEXISCAN    2. Essential hypertension  I10     3. Left carotid bruit  R09.89 PCV CAROTID DUPLEX (BILATERAL)    4. Other fatigue  R53.83 PCV MYOCARDIAL PERFUSION WITH LEXISCAN       Medications Discontinued During This Encounter  Medication Reason   diltiazem (CARDIZEM) 60 MG tablet Error    No orders of the defined types were placed in this encounter.  This patients CHA2DS2-VASc Score 4 (HTN, A, F) and yearly risk of stroke 4.8%.    Recommendations:   Kristi Montgomery is a 87 y.o. Caucasian female with history of hypertension, chronic iron deficiency anemia, anxiety, and GERD, as well as history of tobacco use (quit in 2000) and family history of premature CAD.  She also has history of COVID-19 infection in early 09/2020.   Patient was admitted to the hospital 10/23/2020 - 10/25/2020 with complaints of new onset palpitations and generalized weakness at which time EKG revealed patient to be in A. fib with RVR.  Patient was started on Eliquis given CHA2DS2-VASc score of 4 and discharged with metoprolol.  She then presented to the ED 11/15/2020 again with atrial fibrillation with RVR, at this time she was advised to switch from Lopressor to diltiazem, however patient has been both Lopressor and diltiazem.  She now presents to our office for further evaluation and management of atrial fibrillation.  Patient remains in atrial fibrillation, which is well rate controlled.  She remains symptomatic with fatigue.  She has not been on Eliquis for approximately 4 weeks.  Given continued symptomatic atrial fibrillation discussed with patient management options including cardioversion.  Reviewed with patient and her husband indication, risk, benefits of direct-current cardioversion, she verbalized  understanding and wishes to proceed with the procedure.  We will also obtain nuclear stress test to evaluate for underlying ischemic etiology of cardiac arrhythmia.  However given peripheral neuropathy patient is not a candidate for treadmill stress test.  On physical exam patient has left carotid artery bruit, will obtain carotid artery duplex.  Patient's blood pressure was initially elevated in the office, however it improved upon recheck.  Could consider restarting Aldactone 25 mg daily in the future if blood pressure remains elevated.  Follow up and further recommendations pending cardiovascular work-up.  Patient was seen in collaboration with Dr. Einar Gip. He also reviewed patient's chart and examined the patient. Dr. Einar Gip is in agreement of the plan.   During this visit I reviewed and updated: Tobacco history  allergies medication reconciliation  medical history  surgical history  family history  social history.    Alethia Berthold, PA-C 11/22/2020, 4:17  PM Office: [phone]

## 2020-11-23 ENCOUNTER — Telehealth: Payer: Self-pay | Admitting: Student

## 2020-11-23 ENCOUNTER — Encounter (HOSPITAL_COMMUNITY): Payer: Self-pay | Admitting: Cardiology

## 2020-11-23 NOTE — Telephone Encounter (Signed)
Patient says she needs refill for metoprolol. Also had some concerns regarding pulse rate, says it was 93 yesterday and this morning it is 110. She would like to talk to someone about this. [phone].

## 2020-11-29 ENCOUNTER — Ambulatory Visit: Payer: Medicare Other

## 2020-11-29 ENCOUNTER — Other Ambulatory Visit: Payer: Self-pay

## 2020-11-29 DIAGNOSIS — R0989 Other specified symptoms and signs involving the circulatory and respiratory systems: Secondary | ICD-10-CM

## 2020-12-04 ENCOUNTER — Other Ambulatory Visit: Payer: Self-pay

## 2020-12-04 ENCOUNTER — Ambulatory Visit (HOSPITAL_COMMUNITY)
Admission: RE | Admit: 2020-12-04 | Discharge: 2020-12-04 | Disposition: A | Discharge status: Home / Self Care | Payer: Medicare Other | Attending: Cardiology | Admitting: Cardiology

## 2020-12-04 ENCOUNTER — Encounter (HOSPITAL_COMMUNITY)
Admission: RE | Disposition: A | Discharge status: Home / Self Care | Payer: Self-pay | Source: Home / Self Care | Attending: Cardiology

## 2020-12-04 ENCOUNTER — Ambulatory Visit (HOSPITAL_COMMUNITY): Payer: Medicare Other | Admitting: Anesthesiology

## 2020-12-04 ENCOUNTER — Encounter (HOSPITAL_COMMUNITY): Payer: Self-pay | Admitting: Cardiology

## 2020-12-04 DIAGNOSIS — Z885 Allergy status to narcotic agent status: Secondary | ICD-10-CM | POA: Diagnosis not present

## 2020-12-04 DIAGNOSIS — K219 Gastro-esophageal reflux disease without esophagitis: Secondary | ICD-10-CM | POA: Diagnosis not present

## 2020-12-04 DIAGNOSIS — Z91048 Other nonmedicinal substance allergy status: Secondary | ICD-10-CM | POA: Diagnosis not present

## 2020-12-04 DIAGNOSIS — Z881 Allergy status to other antibiotic agents status: Secondary | ICD-10-CM | POA: Diagnosis not present

## 2020-12-04 DIAGNOSIS — I4891 Unspecified atrial fibrillation: Secondary | ICD-10-CM | POA: Diagnosis present

## 2020-12-04 DIAGNOSIS — F419 Anxiety disorder, unspecified: Secondary | ICD-10-CM | POA: Diagnosis not present

## 2020-12-04 DIAGNOSIS — Z8249 Family history of ischemic heart disease and other diseases of the circulatory system: Secondary | ICD-10-CM | POA: Insufficient documentation

## 2020-12-04 DIAGNOSIS — Z7901 Long term (current) use of anticoagulants: Secondary | ICD-10-CM | POA: Diagnosis not present

## 2020-12-04 DIAGNOSIS — D509 Iron deficiency anemia, unspecified: Secondary | ICD-10-CM | POA: Insufficient documentation

## 2020-12-04 DIAGNOSIS — I10 Essential (primary) hypertension: Secondary | ICD-10-CM | POA: Insufficient documentation

## 2020-12-04 DIAGNOSIS — Z8616 Personal history of COVID-19: Secondary | ICD-10-CM | POA: Insufficient documentation

## 2020-12-04 DIAGNOSIS — Z79899 Other long term (current) drug therapy: Secondary | ICD-10-CM | POA: Diagnosis not present

## 2020-12-04 DIAGNOSIS — Z87891 Personal history of nicotine dependence: Secondary | ICD-10-CM | POA: Diagnosis not present

## 2020-12-04 DIAGNOSIS — R5383 Other fatigue: Secondary | ICD-10-CM | POA: Diagnosis not present

## 2020-12-04 DIAGNOSIS — R0989 Other specified symptoms and signs involving the circulatory and respiratory systems: Secondary | ICD-10-CM | POA: Insufficient documentation

## 2020-12-04 DIAGNOSIS — I4819 Other persistent atrial fibrillation: Secondary | ICD-10-CM

## 2020-12-04 DIAGNOSIS — Z91040 Latex allergy status: Secondary | ICD-10-CM | POA: Diagnosis not present

## 2020-12-04 HISTORY — PX: CARDIOVERSION: SHX1299

## 2020-12-04 SURGERY — CARDIOVERSION
Anesthesia: General

## 2020-12-04 MED ORDER — PROPOFOL 10 MG/ML IV BOLUS
INTRAVENOUS | Status: DC | PRN
  Administered 2020-12-04: 50 mg via INTRAVENOUS

## 2020-12-04 MED ORDER — LIDOCAINE HCL (CARDIAC) PF 100 MG/5ML IV SOSY
PREFILLED_SYRINGE | INTRAVENOUS | Status: DC | PRN
  Administered 2020-12-04: 60 mg via INTRAVENOUS

## 2020-12-04 NOTE — CV Procedure (Signed)
Direct current cardioversion 12/04/2020 9:51 AM  Indication symptomatic A. Fibrillation.  Procedure: Using 50 mg of IV Propofol and 60 IV Lidocaine (for reducing venous pain) for achieving deep sedation, synchronized direct current cardioversion performed. Patient was delivered with 120 Joules of electricity X 1 with success to NSR. Patient tolerated the procedure well. No immediate complication noted.   Allergies as of 12/04/2020       Reactions   Amoxicillin Swelling, Other (See Comments)   Tongue swelling  Has patient had a PCN reaction causing immediate rash, facial/tongue/throat swelling, SOB or lightheadedness with hypotension: Yes Has patient had a PCN reaction causing severe rash involving mucus membranes or skin necrosis: Unknown Has patient had a PCN reaction that required hospitalization: Unknown Has patient had a PCN reaction occurring within the last 10 years: Unknown If all of the above answers are "NO", then may proceed with Cephalosporin use.   Amoxicillin-pot Clavulanate Swelling, Other (See Comments)   Swollen and red tongue   Codeine Nausea And Vomiting   Latex Rash, Other (See Comments)   NO BAND-AIDS!!!!   Tape Rash, Other (See Comments)   NO BAND-AIDS OR TAPE!!!!        Medication List     STOP taking these medications    spironolactone 25 MG tablet Commonly known as: ALDACTONE       TAKE these medications    ALPRAZolam 0.25 MG tablet Commonly known as: XANAX Take 0.125 mg by mouth 2 (two) times daily as needed for anxiety.   diltiazem 60 MG tablet Commonly known as: Cardizem Take 1 tablet (60 mg total) by mouth 3 (three) times daily. What changed: how much to take   Eliquis 2.5 MG Tabs tablet Generic drug: apixaban Take 1 tablet (2.5 mg total) by mouth 2 (two) times daily.   ferrous sulfate 325 (65 FE) MG tablet Take 325 mg by mouth every other day.   MAGNESIUM PO Take 1 tablet by mouth daily.   metoprolol tartrate 50 MG  tablet Commonly known as: LOPRESSOR Take 50 mg by mouth 2 (two) times daily.   omeprazole 40 MG capsule Commonly known as: PRILOSEC Take 40 mg by mouth daily as needed (heartburn).   PATADAY OP Place 1 drop into both eyes daily at 12 noon.   Refresh Optive Advanced PF 0.5-1-0.5 % Soln Generic drug: Carboxymeth-Glyc-Polysorb PF Place 1 drop into both eyes at bedtime.   Systane Ultra PF 0.4-0.3 % Soln Generic drug: Polyethyl Glyc-Propyl Glyc PF Place 1 drop into both eyes in the morning, at noon, in the evening, and at bedtime.   vitamin B-12 1000 MCG tablet Commonly known as: CYANOCOBALAMIN Take 1,000 mcg by mouth daily.   Vitamin D-3 25 MCG (1000 UT) Caps Take 1,000 Units by mouth daily.   VITAMIN E PO Take 1 capsule by mouth daily.   zolpidem 10 MG tablet Commonly known as: AMBIEN Take 0.5 tablets by mouth at bedtime as needed.          [name], MD, [facility] 12/04/2020, 9:51 AM Office: [phone] Fax: [phone] Pager: [phone]

## 2020-12-04 NOTE — Anesthesia Procedure Notes (Signed)
Procedure Name: General with mask airway Date/Time: 12/04/2020 9:38 AM Performed by: Griffin Dakin, CRNA Pre-anesthesia Checklist: Patient identified, Emergency Drugs available, Suction available, Patient being monitored and Timeout performed Patient Re-evaluated:Patient Re-evaluated prior to induction Oxygen Delivery Method: Ambu bag Induction Type: IV induction Ventilation: Mask ventilation without difficulty Placement Confirmation: positive ETCO2 and breath sounds checked- equal and bilateral Dental Injury: Teeth and Oropharynx as per pre-operative assessment

## 2020-12-04 NOTE — Interval H&P Note (Signed)
History and Physical Interval Note:  12/04/2020 9:40 AM  Kristi Montgomery  has presented today for surgery, with the diagnosis of A-FIB.  The various methods of treatment have been discussed with the patient and family. After consideration of risks, benefits and other options for treatment, the patient has consented to  Procedure(s): CARDIOVERSION (N/A) as a surgical intervention.  The patient's history has been reviewed, patient examined, no change in status, stable for surgery.  I have reviewed the patient's chart and labs.  Questions were answered to the patient's satisfaction.     [name]

## 2020-12-04 NOTE — Transfer of Care (Signed)
Immediate Anesthesia Transfer of Care Note  Patient: Kristi Montgomery  Procedure(s) Performed: CARDIOVERSION  Patient Location: Endoscopy Unit  Anesthesia Type:General  Level of Consciousness: awake, alert  and oriented  Airway & Oxygen Therapy: Patient Spontanous Breathing  Post-op Assessment: Report given to RN and Post -op Vital signs reviewed and stable  Post vital signs: Reviewed and stable  Last Vitals:  Vitals Value Taken Time  BP 152/110 12/04/20 0933  Temp    Pulse 110 12/04/20 0933  Resp 30 12/04/20 0933  SpO2 100 % 12/04/20 0933  Vitals shown include unvalidated device data.  Last Pain:  Vitals:   12/04/20 0820  TempSrc: Oral  PainSc: 0-No pain         Complications: No notable events documented.

## 2020-12-04 NOTE — Anesthesia Preprocedure Evaluation (Addendum)
Anesthesia Evaluation  Patient identified by MRN, date of birth, ID band Patient awake    Reviewed: Allergy & Precautions, NPO status , Patient's Chart, lab work & pertinent test results, reviewed documented beta blocker date and time   Airway Mallampati: III  TM Distance: >3 FB Neck ROM: Full    Dental no notable dental hx.    Pulmonary former smoker,  Quit smoking 1990   Pulmonary exam normal breath sounds clear to auscultation       Cardiovascular hypertension, Pt. on medications and Pt. on home beta blockers Normal cardiovascular exam+ dysrhythmias (eliquis) Atrial Fibrillation  Rhythm:Irregular Rate:Normal  Echo: 1. Left ventricular ejection fraction, by estimation, is 60 to 65%. The  left ventricle has normal function. The left ventricle has no regional  wall motion abnormalities. Left ventricular diastolic function could not  be evaluated.  2. Right ventricular systolic function is normal. The right ventricular  size is normal. There is normal pulmonary artery systolic pressure.  3. Left atrial size was mildly dilated.  4. The mitral valve is normal in structure. Trivial mitral valve  regurgitation.  5. Tricuspid valve regurgitation is moderate.  6. The aortic valve is tricuspid. There is mild calcification of the  aortic valve. There is mild thickening of the aortic valve. Aortic valve  regurgitation is not visualized. Mild to moderate aortic valve  sclerosis/calcification is present, without any  evidence of aortic stenosis.  7. The inferior vena cava is dilated in size with >50% respiratory  variability, suggesting right atrial pressure of 8 mmHg.    Neuro/Psych PSYCHIATRIC DISORDERS Anxiety negative neurological ROS     GI/Hepatic Neg liver ROS, GERD  Medicated and Controlled,  Endo/Other  negative endocrine ROS  Renal/GU negative Renal ROS  negative genitourinary   Musculoskeletal negative  musculoskeletal ROS (+)   Abdominal   Peds negative pediatric ROS (+)  Hematology negative hematology ROS (+)   Anesthesia Other Findings   Reproductive/Obstetrics negative OB ROS                            Anesthesia Physical Anesthesia Plan  ASA: 3  Anesthesia Plan: General   Post-op Pain Management:    Induction: Intravenous  PONV Risk Score and Plan: TIVA and Treatment may vary due to age or medical condition  Airway Management Planned: Natural Airway and Mask  Additional Equipment: None  Intra-op Plan:   Post-operative Plan:   Informed Consent: I have reviewed the patients History and Physical, chart, labs and discussed the procedure including the risks, benefits and alternatives for the proposed anesthesia with the patient or authorized representative who has indicated his/her understanding and acceptance.       Plan Discussed with: CRNA  Anesthesia Plan Comments:         Anesthesia Quick Evaluation

## 2020-12-04 NOTE — Anesthesia Postprocedure Evaluation (Signed)
Anesthesia Post Note  Patient: Kristi Montgomery  Procedure(s) Performed: CARDIOVERSION     Patient location during evaluation: PACU Anesthesia Type: General Level of consciousness: awake and alert, oriented and patient cooperative Pain management: pain level controlled Vital Signs Assessment: post-procedure vital signs reviewed and stable Respiratory status: spontaneous breathing, nonlabored ventilation and respiratory function stable Cardiovascular status: blood pressure returned to baseline and stable Postop Assessment: no apparent nausea or vomiting Anesthetic complications: no   No notable events documented.  Last Vitals:  Vitals:   12/04/20 1003 12/04/20 1016  BP: (!) 137/55 132/81  Pulse: 60 64  Resp: 13 (!) 23  Temp:    SpO2: 100% 100%    Last Pain:  Vitals:   12/04/20 1016  TempSrc:   PainSc: 0-No pain   Pain Goal:                   [name]

## 2020-12-10 ENCOUNTER — Other Ambulatory Visit: Payer: Medicare Other

## 2020-12-13 ENCOUNTER — Ambulatory Visit: Payer: Medicare Other | Admitting: Internal Medicine

## 2020-12-18 ENCOUNTER — Encounter: Payer: Self-pay | Admitting: Student

## 2020-12-18 ENCOUNTER — Ambulatory Visit: Payer: Medicare Other | Admitting: Student

## 2020-12-18 ENCOUNTER — Other Ambulatory Visit: Payer: Self-pay

## 2020-12-18 VITALS — BP 163/60 | HR 60 | Temp 97.8°F | Resp 16 | Ht 63.0 in | Wt 134.0 lb

## 2020-12-18 DIAGNOSIS — I10 Essential (primary) hypertension: Secondary | ICD-10-CM

## 2020-12-18 DIAGNOSIS — I6523 Occlusion and stenosis of bilateral carotid arteries: Secondary | ICD-10-CM

## 2020-12-18 DIAGNOSIS — I4891 Unspecified atrial fibrillation: Secondary | ICD-10-CM

## 2020-12-18 MED ORDER — AMLODIPINE BESYLATE 10 MG PO TABS: 10.0000 mg | ORAL_TABLET | Freq: Every day | ORAL | 3 refills | Status: DC

## 2020-12-18 NOTE — Progress Notes (Signed)
Primary Physician/Referring:  Shon Baton, MD  Patient ID: Kristi Montgomery, female    DOB: 1931-02-20, 87 y.o.   MRN: [id number]  Chief Complaint  Patient presents with   Atrial fibrillation with rapid ventricular response    Hypertension   Follow-up    2 weeks after Cardioversion   HPI:    Kristi Montgomery  is a 87 y.o. Caucasian female with history of hypertension, chronic iron deficiency anemia, anxiety, and GERD, as well as history of tobacco use (quit in 2000) and family history of premature CAD.  She also has history of COVID-19 infection in early 09/2020.  Diagnosed with A. fib 09/2020.  Patient was started on Eliquis 10/23/2020.  Patient was seen in the office for follow-up 11/22/2020 and remained in atrial fibrillation, she was therefore scheduled for direct-current cardioversion as she was symptomatic.  Patient underwent successful direct-current cardioversion 12/04/2020.  She now presents for follow-up.  Patient is feeling well overall, however she does note her blood pressure has been high on home monitoring.  Pressure is also uncontrolled in the office today.  Patient denies chest pain, palpitations, syncope, near syncope.  Reports dizziness and lightheadedness have improved, however she does continue to have occasional episodes of brief lightheadedness particularly in the mornings lasting several seconds.  Past Medical History:  Diagnosis Date   Anxiety    Cancer ([place])    Melanoma   Cataract    Chest pain    myoview 04/01/12-normal   History of tobacco abuse    HTN (hypertension)    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/06/2017   Past Surgical History:  Procedure Laterality Date   CARDIOVERSION N/A 12/04/2020   Procedure: CARDIOVERSION;  Surgeon: [name], MD;  Location: [facility] ENDOSCOPY;  Service: Cardiovascular;  Laterality: N/A;   CHOLECYSTECTOMY     MELANOMA EXCISION     x5   Family History  Problem Relation Age of Onset   Congestive Heart Failure Mother    Heart failure Mother     Heart attack Father        age 85   Heart Problems Brother    Cancer Brother    Cancer Brother        pancreatic    Cancer Sister        breast    Cancer Sister        breast   Breast cancer Neg Hx     Social History   Tobacco Use   Smoking status: Former    Types: Cigarettes    Quit date: 02/25/1988    Years since quitting: 32.8   Smokeless tobacco: Never  Substance Use Topics   Alcohol use: No   Marital Status: Married   ROS  Review of Systems  Constitutional: Positive for malaise/fatigue (improving). Negative for weight gain.  Cardiovascular:  Negative for chest pain, claudication, leg swelling, near-syncope, orthopnea, palpitations, paroxysmal nocturnal dyspnea and syncope.  Respiratory:  Negative for shortness of breath.    Objective  Blood pressure (!) 163/60, pulse 60, temperature 97.8 F (36.6 C), resp. rate 16, height 5\' 3"  (1.6 m), weight 134 lb (60.8 kg), SpO2 99 %.  Vitals with BMI 12/18/2020 12/04/2020 12/04/2020  Height 5\' 3"  - -  Weight 134 lbs - -  BMI 72.09 - -  Systolic [phone]  Diastolic 60 81 55  Pulse 60 64 60      Physical Exam Vitals reviewed.  HENT:     Head: Normocephalic and atraumatic.  Cardiovascular:  Rate and Rhythm: Regular rhythm. Bradycardia present.     Pulses: Intact distal pulses.          Carotid pulses are  on the left side with bruit.    Heart sounds: S1 normal and S2 normal. No murmur heard.   No gallop.  Pulmonary:     Effort: Pulmonary effort is normal. No respiratory distress.     Breath sounds: No wheezing, rhonchi or rales.  Musculoskeletal:     Right lower leg: Edema (trace) present.     Left lower leg: Edema (trace) present.  Neurological:     Mental Status: She is alert.    Laboratory examination:   Recent Labs    10/23/20 1519 10/25/20 0257 11/15/20 1721  NA 136 136 134*  K 3.3* 3.9 4.0  CL 106 104 101  CO2 24 26 25   GLUCOSE 162* 139* 186*  BUN 6* 8 13  CREATININE 0.65 0.71 0.85  CALCIUM  8.7* 8.9 9.0  GFRNONAA >60 >60 >60   CrCl cannot be calculated (Patient's most recent lab result is older than the maximum 21 days allowed.).  CMP Latest Ref Rng & Units 11/15/2020 10/25/2020 10/23/2020  Glucose 70 - 99 mg/dL 186(H) 139(H) 162(H)  BUN 8 - 23 mg/dL 13 8 6(L)  Creatinine 0.44 - 1.00 mg/dL 0.85 0.71 0.65  Sodium 135 - 145 mmol/L 134(L) 136 136  Potassium 3.5 - 5.1 mmol/L 4.0 3.9 3.3(L)  Chloride 98 - 111 mmol/L 101 104 106  CO2 22 - 32 mmol/L 25 26 24   Calcium 8.9 - 10.3 mg/dL 9.0 8.9 8.7(L)  Total Protein 6.0 - 8.5 g/dL - - -  Total Bilirubin 0.0 - 1.2 mg/dL - - -  Alkaline Phos 39 - 117 IU/L - - -  AST 0 - 40 IU/L - - -  ALT 0 - 32 IU/L - - -   CBC Latest Ref Rng & Units 11/15/2020 10/25/2020 10/24/2020  WBC 4.0 - 10.5 K/uL 7.2 4.7 5.0  Hemoglobin 12.0 - 15.0 g/dL 11.4(L) 10.6(L) 10.8(L)  Hematocrit 36.0 - 46.0 % 32.9(L) 30.0(L) 31.3(L)  Platelets 150 - 400 K/uL 250 293 317    Lipid Panel No results for input(s): CHOL, TRIG, LDLCALC, VLDL, HDL, CHOLHDL, LDLDIRECT in the last 8760 hours.  HEMOGLOBIN A1C Lab Results  Component Value Date   HGBA1C 5.6 10/23/2020   MPG 114 10/23/2020   TSH Recent Labs    10/23/20 1519  TSH 1.149    External labs:  None   Allergies   Allergies  Allergen Reactions   Amoxicillin Swelling and Other (See Comments)    Tongue swelling  Has patient had a PCN reaction causing immediate rash, facial/tongue/throat swelling, SOB or lightheadedness with hypotension: Yes Has patient had a PCN reaction causing severe rash involving mucus membranes or skin necrosis: Unknown Has patient had a PCN reaction that required hospitalization: Unknown Has patient had a PCN reaction occurring within the last 10 years: Unknown If all of the above answers are "NO", then may proceed with Cephalosporin use.    Amoxicillin-Pot Clavulanate Swelling and Other (See Comments)    Swollen and red tongue   Codeine Nausea And Vomiting   Latex Rash and Other  (See Comments)    NO BAND-AIDS!!!!   Tape Rash and Other (See Comments)    NO BAND-AIDS OR TAPE!!!!    Medications Prior to Visit:   Outpatient Medications Prior to Visit  Medication Sig Dispense Refill   ALPRAZolam (XANAX) 0.25 MG tablet  Take 0.125 mg by mouth 2 (two) times daily as needed for anxiety.     apixaban (ELIQUIS) 2.5 MG TABS tablet Take 1 tablet (2.5 mg total) by mouth 2 (two) times daily. 60 tablet 0   Cholecalciferol (VITAMIN D-3) 25 MCG (1000 UT) CAPS Take 1,000 Units by mouth daily.     ferrous sulfate 325 (65 FE) MG tablet Take 325 mg by mouth every other day.     MAGNESIUM PO Take 1 tablet by mouth daily.     metoprolol tartrate (LOPRESSOR) 50 MG tablet Take 50 mg by mouth 2 (two) times daily.     Olopatadine HCl (PATADAY OP) Place 1 drop into both eyes daily at 12 noon.     omeprazole (PRILOSEC) 40 MG capsule Take 40 mg by mouth daily as needed (heartburn).     REFRESH OPTIVE ADVANCED PF 0.5-1-0.5 % SOLN Place 1 drop into both eyes at bedtime.     SYSTANE ULTRA PF 0.4-0.3 % SOLN Place 1 drop into both eyes in the morning, at noon, in the evening, and at bedtime.     vitamin B-12 (CYANOCOBALAMIN) 1000 MCG tablet Take 1,000 mcg by mouth daily.     VITAMIN E PO Take 1 capsule by mouth daily.     zolpidem (AMBIEN) 10 MG tablet Take 0.5 tablets by mouth at bedtime as needed.     diltiazem (CARDIZEM) 60 MG tablet Take 1 tablet (60 mg total) by mouth 3 (three) times daily. 180 tablet 0   No facility-administered medications prior to visit.   Final Medications at End of Visit    Current Meds  Medication Sig   ALPRAZolam (XANAX) 0.25 MG tablet Take 0.125 mg by mouth 2 (two) times daily as needed for anxiety.   amLODipine (NORVASC) 10 MG tablet Take 1 tablet (10 mg total) by mouth daily.   apixaban (ELIQUIS) 2.5 MG TABS tablet Take 1 tablet (2.5 mg total) by mouth 2 (two) times daily.   Cholecalciferol (VITAMIN D-3) 25 MCG (1000 UT) CAPS Take 1,000 Units by mouth daily.    ferrous sulfate 325 (65 FE) MG tablet Take 325 mg by mouth every other day.   MAGNESIUM PO Take 1 tablet by mouth daily.   metoprolol tartrate (LOPRESSOR) 50 MG tablet Take 50 mg by mouth 2 (two) times daily.   Olopatadine HCl (PATADAY OP) Place 1 drop into both eyes daily at 12 noon.   omeprazole (PRILOSEC) 40 MG capsule Take 40 mg by mouth daily as needed (heartburn).   REFRESH OPTIVE ADVANCED PF 0.5-1-0.5 % SOLN Place 1 drop into both eyes at bedtime.   SYSTANE ULTRA PF 0.4-0.3 % SOLN Place 1 drop into both eyes in the morning, at noon, in the evening, and at bedtime.   vitamin B-12 (CYANOCOBALAMIN) 1000 MCG tablet Take 1,000 mcg by mouth daily.   VITAMIN E PO Take 1 capsule by mouth daily.   zolpidem (AMBIEN) 10 MG tablet Take 0.5 tablets by mouth at bedtime as needed.   [DISCONTINUED] diltiazem (CARDIZEM) 60 MG tablet Take 1 tablet (60 mg total) by mouth 3 (three) times daily.   Radiology:   No results found.  Cardiac Studies:   Echocardiogram 10/23/2020:   1. Left ventricular ejection fraction, by estimation, is 60 to 65%. The  left ventricle has normal function. The left ventricle has no regional  wall motion abnormalities. Left ventricular diastolic function could not  be evaluated.   2. Right ventricular systolic function is normal. The right ventricular  size is normal. There is normal pulmonary artery systolic pressure.   3. Left atrial size was mildly dilated.   4. The mitral valve is normal in structure. Trivial mitral valve  regurgitation.   5. Tricuspid valve regurgitation is moderate.   6. The aortic valve is tricuspid. There is mild calcification of the  aortic valve. There is mild thickening of the aortic valve. Aortic valve  regurgitation is not visualized. Mild to moderate aortic valve  sclerosis/calcification is present, without any  evidence of aortic stenosis.   7. The inferior vena cava is dilated in size with >50% respiratory  variability, suggesting right  atrial pressure of 8 mmHg.   Carotid artery duplex 11/29/2020:  No evidence of significant stenosis in the right carotid vessels.  Duplex suggests stenosis in the left internal carotid artery (16-49%).  Duplex suggests stenosis in the left external carotid artery (<50%).  Antegrade right vertebral artery flow. Antegrade left vertebral artery flow.  Follow up in one year is appropriate if clinically indicated.v  Direct current cardioversion 12/04/2020 9:51 AM Indication symptomatic A. Fibrillation. Procedure: Using 50 mg of IV Propofol and 60 IV Lidocaine (for reducing venous pain) for achieving deep sedation, synchronized direct current cardioversion performed. Patient was delivered with 120 Joules of electricity X 1 with success to NSR. Patient tolerated the procedure well. No immediate complication noted.   EKG:  12/18/2020: Sinus bradycardia at a rate of 55 bpm with single PAC.  EKG 11/22/2020: Atrial fibrillation with controlled ventricular sponsor rate of 94 bpm.  Normal axis.  No evidence of ischemia or underlying injury pattern.  Assessment     ICD-10-CM   1. Atrial fibrillation with rapid ventricular response (HCC)  I48.91 EKG 12-Lead    2. Bilateral carotid artery stenosis  I65.23 PCV CAROTID DUPLEX (BILATERAL)    3. Essential hypertension  I10        Medications Discontinued During This Encounter  Medication Reason   diltiazem (CARDIZEM) 60 MG tablet Change in therapy    Meds ordered this encounter  Medications   amLODipine (NORVASC) 10 MG tablet    Sig: Take 1 tablet (10 mg total) by mouth daily.    Dispense:  90 tablet    Refill:  3    This patients CHA2DS2-VASc Score 4 (HTN, A, F) and yearly risk of stroke 4.8%.    Recommendations:   Kristi Montgomery is a 87 y.o. Caucasian female with history of hypertension, chronic iron deficiency anemia, anxiety, and GERD, as well as history of tobacco use (quit in 2000) and family history of premature CAD.  She also has  history of COVID-19 infection in early 09/2020.  Diagnosed with A. fib 09/2020.  Patient was started on Eliquis 10/23/2020.  Patient was seen in the office for follow-up 11/22/2020 and remained in atrial fibrillation, she was therefore scheduled for direct-current cardioversion as she was symptomatic.  Patient underwent successful direct-current cardioversion 12/04/2020.  She now presents for follow-up.  Patient is presently maintaining sinus rhythm.  She continues to tolerate Eliquis without bleeding diathesis.  Reviewed and discussed carotid artery duplex revealing left carotid artery stenosis, details above.  We will repeat carotid duplex in 1 year.  Patient's heart rate is under excellent control, however blood pressure is elevated.  Blood pressure was previously well controlled with amlodipine.  We will therefore discontinue diltiazem and switch patient back to amlodipine 10 mg p.o. daily, will continue present metoprolol.  Recommend ischemic evaluation given new onset of atrial fibrillation in 09/2020.  Shared decision was to proceed with nuclear stress test to evaluate for underlying ischemic etiology of cardiac arrhythmia.  However given peripheral neuropathy patient is not a candidate for treadmill stress test.  Notably could consider restarting Aldactone 25 mg daily in the future if blood pressure remains elevated.  Follow-up in 4 weeks, sooner if needed, for results of cardiac testing and hypertension.   Alethia Berthold, PA-C 12/18/2020, 12:09 PM Office: [phone]

## 2020-12-28 ENCOUNTER — Other Ambulatory Visit: Payer: Self-pay

## 2020-12-28 ENCOUNTER — Ambulatory Visit
Admission: RE | Admit: 2020-12-28 | Discharge: 2020-12-28 | Disposition: A | Discharge status: Home / Self Care | Payer: Medicare Other | Source: Ambulatory Visit | Attending: Internal Medicine | Admitting: Internal Medicine

## 2020-12-28 ENCOUNTER — Ambulatory Visit: Payer: Medicare Other | Admitting: Student

## 2020-12-28 VITALS — BP 141/61 | HR 59

## 2020-12-28 DIAGNOSIS — Z1231 Encounter for screening mammogram for malignant neoplasm of breast: Secondary | ICD-10-CM

## 2020-12-28 DIAGNOSIS — I10 Essential (primary) hypertension: Secondary | ICD-10-CM

## 2020-12-28 MED ORDER — SPIRONOLACTONE 25 MG PO TABS: 12.5000 mg | ORAL_TABLET | Freq: Every day | ORAL | 3 refills | Status: DC

## 2020-12-28 NOTE — Progress Notes (Signed)
Patient presented for blood pressure check today.  She has been taking amlodipine 10 mg daily since last office visit.  Blood pressure remains elevated above goal today at 141/61 mmHg.  We will therefore resume spironolactone.  She had previously been on 25 mg daily, will start her on 12.5 mg once daily and repeat BMP in 1 week.  Will keep upcoming follow-up appointment.    ICD-10-CM   1. Essential hypertension  I10 spironolactone (ALDACTONE) 25 MG tablet    Basic metabolic panel        Kristi Berthold, PA-C 12/28/2020, 1:17 PM Office: [phone]

## 2021-01-08 LAB — BASIC METABOLIC PANEL
BUN/Creatinine Ratio: 13 (ref 12–28)
BUN: 12 mg/dL (ref 8–27)
CO2: 21 mmol/L (ref 20–29)
Calcium: 9.4 mg/dL (ref 8.7–10.3)
Chloride: 95 mmol/L — ABNORMAL LOW (ref 96–106)
Creatinine, Ser: 0.91 mg/dL (ref 0.57–1.00)
Glucose: 187 mg/dL — ABNORMAL HIGH (ref 70–99)
Potassium: 4 mmol/L (ref 3.5–5.2)
Sodium: 134 mmol/L (ref 134–144)
eGFR: 61 mL/min/{1.73_m2} (ref >59)

## 2021-01-08 NOTE — Progress Notes (Signed)
Pt aware.

## 2021-01-08 NOTE — Progress Notes (Signed)
Please notify patient her renal function remained stable.

## 2021-01-09 ENCOUNTER — Ambulatory Visit: Payer: Medicare Other | Admitting: Student

## 2021-01-09 ENCOUNTER — Encounter: Payer: Self-pay | Admitting: Student

## 2021-01-09 ENCOUNTER — Ambulatory Visit: Payer: Medicare Other

## 2021-01-09 ENCOUNTER — Other Ambulatory Visit: Payer: Self-pay

## 2021-01-09 VITALS — BP 126/66 | HR 65 | Temp 98.0°F | Resp 16 | Ht 63.0 in | Wt 128.0 lb

## 2021-01-09 DIAGNOSIS — I4891 Unspecified atrial fibrillation: Secondary | ICD-10-CM

## 2021-01-09 DIAGNOSIS — R5383 Other fatigue: Secondary | ICD-10-CM

## 2021-01-09 LAB — PCV MYOCARDIAL PERFUSION WITH LEXISCAN
Base ST Depression (mm): 0 mm
ST Depression (mm): 0 mm

## 2021-01-09 MED ORDER — AMIODARONE HCL 200 MG PO TABS: 200.0000 mg | ORAL_TABLET | Freq: Two times a day (BID) | ORAL | 3 refills | Status: DC

## 2021-01-09 NOTE — Progress Notes (Signed)
Primary Physician/Referring:  Shon Baton, MD  Patient ID: Kristi Montgomery, female    DOB: Jul 06, 1930, 87 y.o.   MRN: [id number]  Chief Complaint  Patient presents with   Hypertension   Atrial Fibrillation   Follow-up   HPI:    Kristi Montgomery  is a 87 y.o. Caucasian female with history of hypertension, chronic iron deficiency anemia, anxiety, and GERD, as well as history of tobacco use (quit in 2000) and family history of premature CAD.  She also has history of COVID-19 infection in early 09/2020.  Diagnosed with A. fib 09/2020.  Patient was started on Eliquis 10/23/2020.  Patient was last seen for office visit 12/18/2020 at which time she was in sinus bradycardia.  Therefore discontinue diltiazem and switch her back to amlodipine for improved blood pressure control.  Also following that visit blood pressure remained elevated, therefore advised patient to resume spironolactone 12.5 mg daily, repeat BMP remained stable.  Patient was in our office today for stress test and was in atrial fibrillation at about 130 bpm. Patient was therefore given metoprolol 5 mg IV to improve rate control.  Patient's heart rate came down to 110 bpm and her symptoms of weakness, dizziness, and dyspnea improved.  She continues to tolerate Eliquis without bleeding diathesis.  Past Medical History:  Diagnosis Date   Anxiety    Cancer ([place])    Melanoma   Cataract    Chest pain    myoview 04/01/12-normal   History of tobacco abuse    HTN (hypertension)    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/06/2017   Past Surgical History:  Procedure Laterality Date   CARDIOVERSION N/A 12/04/2020   Procedure: CARDIOVERSION;  Surgeon: [name], MD;  Location: [facility] ENDOSCOPY;  Service: Cardiovascular;  Laterality: N/A;   CHOLECYSTECTOMY     MELANOMA EXCISION     x5   Family History  Problem Relation Age of Onset   Congestive Heart Failure Mother    Heart failure Mother    Heart attack Father        age 41   Heart Problems Brother     Cancer Brother    Cancer Brother        pancreatic    Cancer Sister        breast    Cancer Sister        breast   Breast cancer Neg Hx     Social History   Tobacco Use   Smoking status: Former    Types: Cigarettes    Quit date: 02/25/1988    Years since quitting: 32.8   Smokeless tobacco: Never  Substance Use Topics   Alcohol use: No   Marital Status: Married   ROS  Review of Systems  Constitutional: Positive for malaise/fatigue. Negative for weight gain.  Cardiovascular:  Negative for chest pain, claudication, leg swelling, near-syncope, orthopnea, palpitations, paroxysmal nocturnal dyspnea and syncope.  Respiratory:  Positive for shortness of breath.    Objective  Blood pressure 126/66, pulse 65, temperature 98 F (36.7 C), resp. rate 16, height 5\' 3"  (1.6 m), weight 128 lb (58.1 kg), SpO2 99 %.  Vitals with BMI 01/09/2021 12/28/2020 12/28/2020  Height 5\' 3"  - -  Weight 128 lbs - -  BMI 53.29 - -  Systolic 924 268 341  Diastolic 66 61 64  Pulse 65 59 63      Physical Exam Vitals reviewed.  HENT:     Head: Normocephalic and atraumatic.  Cardiovascular:     Rate  and Rhythm: Tachycardia present. Rhythm irregular.     Pulses: Intact distal pulses.          Carotid pulses are  on the left side with bruit.    Heart sounds: S1 normal and S2 normal. No murmur heard.   No gallop.  Pulmonary:     Effort: Pulmonary effort is normal. No respiratory distress.     Breath sounds: No wheezing, rhonchi or rales.  Musculoskeletal:     Right lower leg: Edema (1+ pitting) present.     Left lower leg: Edema (1+ pitting) present.  Neurological:     Mental Status: She is alert.    Laboratory examination:   Recent Labs    10/23/20 1519 10/25/20 0257 11/15/20 1721 01/07/21 1007  NA 136 136 134* 134  K 3.3* 3.9 4.0 4.0  CL 106 104 101 95*  CO2 24 26 25 21   GLUCOSE 162* 139* 186* 187*  BUN 6* 8 13 12   CREATININE 0.65 0.71 0.85 0.91  CALCIUM 8.7* 8.9 9.0 9.4  GFRNONAA  >60 >60 >60  --    estimated creatinine clearance is 35.3 mL/min (by C-G formula based on SCr of 0.91 mg/dL).  CMP Latest Ref Rng & Units 01/07/2021 11/15/2020 10/25/2020  Glucose 70 - 99 mg/dL 187(H) 186(H) 139(H)  BUN 8 - 27 mg/dL 12 13 8   Creatinine 0.57 - 1.00 mg/dL 0.91 0.85 0.71  Sodium 134 - 144 mmol/L 134 134(L) 136  Potassium 3.5 - 5.2 mmol/L 4.0 4.0 3.9  Chloride 96 - 106 mmol/L 95(L) 101 104  CO2 20 - 29 mmol/L 21 25 26   Calcium 8.7 - 10.3 mg/dL 9.4 9.0 8.9  Total Protein 6.0 - 8.5 g/dL - - -  Total Bilirubin 0.0 - 1.2 mg/dL - - -  Alkaline Phos 39 - 117 IU/L - - -  AST 0 - 40 IU/L - - -  ALT 0 - 32 IU/L - - -   CBC Latest Ref Rng & Units 11/15/2020 10/25/2020 10/24/2020  WBC 4.0 - 10.5 K/uL 7.2 4.7 5.0  Hemoglobin 12.0 - 15.0 g/dL 11.4(L) 10.6(L) 10.8(L)  Hematocrit 36.0 - 46.0 % 32.9(L) 30.0(L) 31.3(L)  Platelets 150 - 400 K/uL 250 293 317    Lipid Panel No results for input(s): CHOL, TRIG, LDLCALC, VLDL, HDL, CHOLHDL, LDLDIRECT in the last 8760 hours.  HEMOGLOBIN A1C Lab Results  Component Value Date   HGBA1C 5.6 10/23/2020   MPG 114 10/23/2020   TSH Recent Labs    10/23/20 1519  TSH 1.149    External labs:  None   Allergies   Allergies  Allergen Reactions   Amoxicillin Swelling and Other (See Comments)    Tongue swelling  Has patient had a PCN reaction causing immediate rash, facial/tongue/throat swelling, SOB or lightheadedness with hypotension: Yes Has patient had a PCN reaction causing severe rash involving mucus membranes or skin necrosis: Unknown Has patient had a PCN reaction that required hospitalization: Unknown Has patient had a PCN reaction occurring within the last 10 years: Unknown If all of the above answers are "NO", then may proceed with Cephalosporin use.    Amoxicillin-Pot Clavulanate Swelling and Other (See Comments)    Swollen and red tongue   Codeine Nausea And Vomiting   Latex Rash and Other (See Comments)    NO BAND-AIDS!!!!    Tape Rash and Other (See Comments)    NO BAND-AIDS OR TAPE!!!!    Medications Prior to Visit:   Outpatient Medications Prior to Visit  Medication Sig Dispense Refill   ALPRAZolam (XANAX) 0.25 MG tablet Take 0.125 mg by mouth 2 (two) times daily as needed for anxiety.     amLODipine (NORVASC) 10 MG tablet Take 1 tablet (10 mg total) by mouth daily. 90 tablet 3   apixaban (ELIQUIS) 2.5 MG TABS tablet Take 1 tablet (2.5 mg total) by mouth 2 (two) times daily. 60 tablet 0   Cholecalciferol (VITAMIN D-3) 25 MCG (1000 UT) CAPS Take 1,000 Units by mouth daily.     ferrous sulfate 325 (65 FE) MG tablet Take 325 mg by mouth every other day.     MAGNESIUM PO Take 1 tablet by mouth daily.     metoprolol tartrate (LOPRESSOR) 50 MG tablet Take 50 mg by mouth 2 (two) times daily.     Olopatadine HCl (PATADAY OP) Place 1 drop into both eyes daily at 12 noon.     omeprazole (PRILOSEC) 40 MG capsule Take 40 mg by mouth daily as needed (heartburn).     REFRESH OPTIVE ADVANCED PF 0.5-1-0.5 % SOLN Place 1 drop into both eyes at bedtime.     spironolactone (ALDACTONE) 25 MG tablet Take 0.5 tablets (12.5 mg total) by mouth daily. 45 tablet 3   SYSTANE ULTRA PF 0.4-0.3 % SOLN Place 1 drop into both eyes in the morning, at noon, in the evening, and at bedtime.     vitamin B-12 (CYANOCOBALAMIN) 1000 MCG tablet Take 1,000 mcg by mouth daily.     VITAMIN E PO Take 1 capsule by mouth daily.     zolpidem (AMBIEN) 10 MG tablet Take 0.5 tablets by mouth at bedtime as needed.     No facility-administered medications prior to visit.   Final Medications at End of Visit    Current Meds  Medication Sig   ALPRAZolam (XANAX) 0.25 MG tablet Take 0.125 mg by mouth 2 (two) times daily as needed for anxiety.   amiodarone (PACERONE) 200 MG tablet Take 1 tablet (200 mg total) by mouth 2 (two) times daily.   amLODipine (NORVASC) 10 MG tablet Take 1 tablet (10 mg total) by mouth daily.   apixaban (ELIQUIS) 2.5 MG TABS tablet Take  1 tablet (2.5 mg total) by mouth 2 (two) times daily.   Cholecalciferol (VITAMIN D-3) 25 MCG (1000 UT) CAPS Take 1,000 Units by mouth daily.   ferrous sulfate 325 (65 FE) MG tablet Take 325 mg by mouth every other day.   MAGNESIUM PO Take 1 tablet by mouth daily.   metoprolol tartrate (LOPRESSOR) 50 MG tablet Take 50 mg by mouth 2 (two) times daily.   Olopatadine HCl (PATADAY OP) Place 1 drop into both eyes daily at 12 noon.   omeprazole (PRILOSEC) 40 MG capsule Take 40 mg by mouth daily as needed (heartburn).   REFRESH OPTIVE ADVANCED PF 0.5-1-0.5 % SOLN Place 1 drop into both eyes at bedtime.   spironolactone (ALDACTONE) 25 MG tablet Take 0.5 tablets (12.5 mg total) by mouth daily.   SYSTANE ULTRA PF 0.4-0.3 % SOLN Place 1 drop into both eyes in the morning, at noon, in the evening, and at bedtime.   vitamin B-12 (CYANOCOBALAMIN) 1000 MCG tablet Take 1,000 mcg by mouth daily.   VITAMIN E PO Take 1 capsule by mouth daily.   zolpidem (AMBIEN) 10 MG tablet Take 0.5 tablets by mouth at bedtime as needed.   Radiology:   No results found.  Cardiac Studies:   Echocardiogram 10/23/2020:   1. Left ventricular ejection fraction, by estimation, is  60 to 65%. The  left ventricle has normal function. The left ventricle has no regional  wall motion abnormalities. Left ventricular diastolic function could not  be evaluated.   2. Right ventricular systolic function is normal. The right ventricular  size is normal. There is normal pulmonary artery systolic pressure.   3. Left atrial size was mildly dilated.   4. The mitral valve is normal in structure. Trivial mitral valve  regurgitation.   5. Tricuspid valve regurgitation is moderate.   6. The aortic valve is tricuspid. There is mild calcification of the  aortic valve. There is mild thickening of the aortic valve. Aortic valve  regurgitation is not visualized. Mild to moderate aortic valve  sclerosis/calcification is present, without any   evidence of aortic stenosis.   7. The inferior vena cava is dilated in size with >50% respiratory  variability, suggesting right atrial pressure of 8 mmHg.   Carotid artery duplex 11/29/2020:  No evidence of significant stenosis in the right carotid vessels.  Duplex suggests stenosis in the left internal carotid artery (16-49%).  Duplex suggests stenosis in the left external carotid artery (<50%).  Antegrade right vertebral artery flow. Antegrade left vertebral artery flow.  Follow up in one year is appropriate if clinically indicated.v  Direct current cardioversion 12/04/2020 9:51 AM Indication symptomatic A. Fibrillation. Procedure: Using 50 mg of IV Propofol and 60 IV Lidocaine (for reducing venous pain) for achieving deep sedation, synchronized direct current cardioversion performed. Patient was delivered with 120 Joules of electricity X 1 with success to NSR. Patient tolerated the procedure well. No immediate complication noted.   EKG:  01/09/2021: Atrial fibrillation with ventricular rate of 111 bpm.  12/18/2020: Sinus bradycardia at a rate of 55 bpm with single PAC.  EKG 11/22/2020: Atrial fibrillation with controlled ventricular sponsor rate of 94 bpm.  Normal axis.  No evidence of ischemia or underlying injury pattern.  Assessment     ICD-10-CM   1. Atrial fibrillation with rapid ventricular response (HCC)  I48.91 EKG 12-Lead       There are no discontinued medications.   Meds ordered this encounter  Medications   amiodarone (PACERONE) 200 MG tablet    Sig: Take 1 tablet (200 mg total) by mouth 2 (two) times daily.    Dispense:  60 tablet    Refill:  3    This patients CHA2DS2-VASc Score 4 (HTN, A, F) and yearly risk of stroke 4.8%.    Recommendations:   AVALEEN BROWNLEY is a 87 y.o. Caucasian female with history of hypertension, chronic iron deficiency anemia, anxiety, and GERD, as well as history of tobacco use (quit in 2000) and family history of premature  CAD.  She also has history of COVID-19 infection in early 09/2020.  Diagnosed with A. fib 09/2020.  Patient was started on Eliquis 10/23/2020.  Patient was last seen for office visit 12/18/2020 at which time she was in sinus bradycardia.  Therefore discontinue diltiazem and switch her back to amlodipine for improved blood pressure control.  Also following that visit blood pressure remained elevated, therefore advised patient to resume spironolactone 12.5 mg daily, repeat BMP remained stable.  Patient was in our office today for stress test and was in atrial fibrillation at about 130 bpm. Patient was therefore given metoprolol 5 mg IV to improve rate control.  Patient's heart rate came down to 110 bpm and her symptoms of weakness, dizziness, and dyspnea improved.  EKG today revealed atrial fibrillation at a rate of 111  bpm.  Patient's blood pressure is well controlled.  She is on anticoagulation and tolerating this without bleeding diathesis.  Given recurrence of atrial fibrillation we will add amiodarone 200 mg p.o. twice daily and bring patient back for repeat EKG in 1 week.  Counseled patient regarding signs symptoms that would warrant urgent or emergent evaluation.  Patient verbalized understanding agreement.  She is also accompanied by her daughter at today's office visit, who also verbalizes understanding and agreement.  Follow-up in 1 week, sooner if needed, for atrial fibrillation and repeat EKG.  Patient was seen in collaboration with Dr. Einar Gip. He also reviewed patient's chart and examined the patient. Dr. Einar Gip is in agreement of the plan.    Alethia Berthold, PA-C 01/09/2021, 3:09 PM Office: [phone]

## 2021-01-13 ENCOUNTER — Telehealth: Payer: Self-pay | Admitting: Cardiology

## 2021-01-13 DIAGNOSIS — I480 Paroxysmal atrial fibrillation: Secondary | ICD-10-CM | POA: Insufficient documentation

## 2021-01-13 NOTE — Telephone Encounter (Signed)
Patient called 11/19 evening. Started on amiodarone 200 mg bid after Afib episode during stress testing. She says she now fee;s "bad" but unable to further elaborate. Denies any chest pain. SBP 103 mmHg, which is low for her. HR 55 bpm.   I asked her to hold amiodarone and also spironolactone 12.5 mg. Advised to keep appt in the coming week for repeat EKG, where this could be further addressed, as necessary.  Time spent: 5 min   Nigel Mormon, MD Pager: [phone] Office: [phone]

## 2021-01-14 NOTE — Progress Notes (Signed)
Low risk stress test, will discuss further at upcoming OV.

## 2021-01-14 NOTE — Progress Notes (Deleted)
Primary Physician/Referring:  Shon Baton, MD  Patient ID: Kristi Montgomery, female    DOB: [birth year], 87 y.o.   MRN: [id number]  No chief complaint on file.  HPI:    Kristi Montgomery  is a 87 y.o. Caucasian female with history of hypertension, chronic iron deficiency anemia, anxiety, and GERD, as well as history of tobacco use (quit in 2000) and family history of premature CAD.  She also has history of COVID-19 infection in early 09/2020.  Diagnosed with A. fib 09/2020.  Patient was started on Eliquis 10/23/2020.  Patient presents for 1 week follow-up of atrial fibrillation.  Patient was in our office last week for stress test and noted to be in A. fib with RVR, therefore given metoprolol 5 mg IV which improved rate control.  Given the recurrence of atrial fibrillation added amiodarone 20 mg twice daily at last office visit.  However she called after-hours line over the weekend with concerns of "not feeling well" and was advised to hold amiodarone and spironolactone. ***  ***  Patient was last seen for office visit 12/18/2020 at which time she was in sinus bradycardia.  Therefore discontinue diltiazem and switch her back to amlodipine for improved blood pressure control.  Also following that visit blood pressure remained elevated, therefore advised patient to resume spironolactone 12.5 mg daily, repeat BMP remained stable.  Patient was in our office today for stress test and was in atrial fibrillation at about 130 bpm. Patient was therefore given metoprolol 5 mg IV to improve rate control.  Patient's heart rate came down to 110 bpm and her symptoms of weakness, dizziness, and dyspnea improved.  She continues to tolerate Eliquis without bleeding diathesis.  Past Medical History:  Diagnosis Date   Anxiety    Cancer ([place])    Melanoma   Cataract    Chest pain    myoview 04/01/12-normal   History of tobacco abuse    HTN (hypertension)    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/06/2017   Past Surgical History:   Procedure Laterality Date   CARDIOVERSION N/A 12/04/2020   Procedure: CARDIOVERSION;  Surgeon: [name], MD;  Location: [facility] ENDOSCOPY;  Service: Cardiovascular;  Laterality: N/A;   CHOLECYSTECTOMY     MELANOMA EXCISION     x5   Family History  Problem Relation Age of Onset   Congestive Heart Failure Mother    Heart failure Mother    Heart attack Father        age 49   Heart Problems Brother    Cancer Brother    Cancer Brother        pancreatic    Cancer Sister        breast    Cancer Sister        breast   Breast cancer Neg Hx     Social History   Tobacco Use   Smoking status: Former    Types: Cigarettes    Quit date: 02/25/1988    Years since quitting: 32.9   Smokeless tobacco: Never  Substance Use Topics   Alcohol use: No   Marital Status: Married   ROS  Review of Systems  Constitutional: Positive for malaise/fatigue. Negative for weight gain.  Cardiovascular:  Negative for chest pain, claudication, leg swelling, near-syncope, orthopnea, palpitations, paroxysmal nocturnal dyspnea and syncope.  Respiratory:  Positive for shortness of breath.    Objective  There were no vitals taken for this visit.  Vitals with BMI 01/09/2021 12/28/2020 12/28/2020  Height 5\' 3"  - -  Weight 128 lbs - -  BMI 69.62 - -  Systolic 952 841 324  Diastolic 66 61 64  Pulse 65 59 63      Physical Exam Vitals reviewed.  HENT:     Head: Normocephalic and atraumatic.  Cardiovascular:     Rate and Rhythm: Tachycardia present. Rhythm irregular.     Pulses: Intact distal pulses.          Carotid pulses are  on the left side with bruit.    Heart sounds: S1 normal and S2 normal. No murmur heard.   No gallop.  Pulmonary:     Effort: Pulmonary effort is normal. No respiratory distress.     Breath sounds: No wheezing, rhonchi or rales.  Musculoskeletal:     Right lower leg: Edema (1+ pitting) present.     Left lower leg: Edema (1+ pitting) present.  Neurological:     Mental Status: She  is alert.    Laboratory examination:   Recent Labs    10/23/20 1519 10/25/20 0257 11/15/20 1721 01/07/21 1007  NA 136 136 134* 134  K 3.3* 3.9 4.0 4.0  CL 106 104 101 95*  CO2 24 26 25 21   GLUCOSE 162* 139* 186* 187*  BUN 6* 8 13 12   CREATININE 0.65 0.71 0.85 0.91  CALCIUM 8.7* 8.9 9.0 9.4  GFRNONAA >60 >60 >60  --     estimated creatinine clearance is 35.3 mL/min (by C-G formula based on SCr of 0.91 mg/dL).  CMP Latest Ref Rng & Units 01/07/2021 11/15/2020 10/25/2020  Glucose 70 - 99 mg/dL 187(H) 186(H) 139(H)  BUN 8 - 27 mg/dL 12 13 8   Creatinine 0.57 - 1.00 mg/dL 0.91 0.85 0.71  Sodium 134 - 144 mmol/L 134 134(L) 136  Potassium 3.5 - 5.2 mmol/L 4.0 4.0 3.9  Chloride 96 - 106 mmol/L 95(L) 101 104  CO2 20 - 29 mmol/L 21 25 26   Calcium 8.7 - 10.3 mg/dL 9.4 9.0 8.9  Total Protein 6.0 - 8.5 g/dL - - -  Total Bilirubin 0.0 - 1.2 mg/dL - - -  Alkaline Phos 39 - 117 IU/L - - -  AST 0 - 40 IU/L - - -  ALT 0 - 32 IU/L - - -   CBC Latest Ref Rng & Units 11/15/2020 10/25/2020 10/24/2020  WBC 4.0 - 10.5 K/uL 7.2 4.7 5.0  Hemoglobin 12.0 - 15.0 g/dL 11.4(L) 10.6(L) 10.8(L)  Hematocrit 36.0 - 46.0 % 32.9(L) 30.0(L) 31.3(L)  Platelets 150 - 400 K/uL 250 293 317    Lipid Panel No results for input(s): CHOL, TRIG, LDLCALC, VLDL, HDL, CHOLHDL, LDLDIRECT in the last 8760 hours.  HEMOGLOBIN A1C Lab Results  Component Value Date   HGBA1C 5.6 10/23/2020   MPG 114 10/23/2020   TSH Recent Labs    10/23/20 1519  TSH 1.149     External labs:  None   Allergies   Allergies  Allergen Reactions   Amoxicillin Swelling and Other (See Comments)    Tongue swelling  Has patient had a PCN reaction causing immediate rash, facial/tongue/throat swelling, SOB or lightheadedness with hypotension: Yes Has patient had a PCN reaction causing severe rash involving mucus membranes or skin necrosis: Unknown Has patient had a PCN reaction that required hospitalization: Unknown Has patient had a  PCN reaction occurring within the last 10 years: Unknown If all of the above answers are "NO", then may proceed with Cephalosporin use.    Amoxicillin-Pot Clavulanate Swelling and Other (See Comments)  Swollen and red tongue   Codeine Nausea And Vomiting   Latex Rash and Other (See Comments)    NO BAND-AIDS!!!!   Tape Rash and Other (See Comments)    NO BAND-AIDS OR TAPE!!!!    Medications Prior to Visit:   Outpatient Medications Prior to Visit  Medication Sig Dispense Refill   ALPRAZolam (XANAX) 0.25 MG tablet Take 0.125 mg by mouth 2 (two) times daily as needed for anxiety.     amiodarone (PACERONE) 200 MG tablet Take 1 tablet (200 mg total) by mouth 2 (two) times daily. 60 tablet 3   amLODipine (NORVASC) 10 MG tablet Take 1 tablet (10 mg total) by mouth daily. 90 tablet 3   apixaban (ELIQUIS) 2.5 MG TABS tablet Take 1 tablet (2.5 mg total) by mouth 2 (two) times daily. 60 tablet 0   Cholecalciferol (VITAMIN D-3) 25 MCG (1000 UT) CAPS Take 1,000 Units by mouth daily.     ferrous sulfate 325 (65 FE) MG tablet Take 325 mg by mouth every other day.     MAGNESIUM PO Take 1 tablet by mouth daily.     metoprolol tartrate (LOPRESSOR) 50 MG tablet Take 50 mg by mouth 2 (two) times daily.     Olopatadine HCl (PATADAY OP) Place 1 drop into both eyes daily at 12 noon.     omeprazole (PRILOSEC) 40 MG capsule Take 40 mg by mouth daily as needed (heartburn).     REFRESH OPTIVE ADVANCED PF 0.5-1-0.5 % SOLN Place 1 drop into both eyes at bedtime.     spironolactone (ALDACTONE) 25 MG tablet Take 0.5 tablets (12.5 mg total) by mouth daily. 45 tablet 3   SYSTANE ULTRA PF 0.4-0.3 % SOLN Place 1 drop into both eyes in the morning, at noon, in the evening, and at bedtime.     vitamin B-12 (CYANOCOBALAMIN) 1000 MCG tablet Take 1,000 mcg by mouth daily.     VITAMIN E PO Take 1 capsule by mouth daily.     zolpidem (AMBIEN) 10 MG tablet Take 0.5 tablets by mouth at bedtime as needed.     No  facility-administered medications prior to visit.   Final Medications at End of Visit    No outpatient medications have been marked as taking for the 01/15/21 encounter (Appointment) with Rayetta Pigg, Willard Farquharson C, PA-C.   Radiology:   No results found.  Cardiac Studies:   Echocardiogram 10/23/2020:   1. Left ventricular ejection fraction, by estimation, is 60 to 65%. The  left ventricle has normal function. The left ventricle has no regional  wall motion abnormalities. Left ventricular diastolic function could not  be evaluated.   2. Right ventricular systolic function is normal. The right ventricular  size is normal. There is normal pulmonary artery systolic pressure.   3. Left atrial size was mildly dilated.   4. The mitral valve is normal in structure. Trivial mitral valve  regurgitation.   5. Tricuspid valve regurgitation is moderate.   6. The aortic valve is tricuspid. There is mild calcification of the  aortic valve. There is mild thickening of the aortic valve. Aortic valve  regurgitation is not visualized. Mild to moderate aortic valve  sclerosis/calcification is present, without any  evidence of aortic stenosis.   7. The inferior vena cava is dilated in size with >50% respiratory  variability, suggesting right atrial pressure of 8 mmHg.   Carotid artery duplex 11/29/2020:  No evidence of significant stenosis in the right carotid vessels.  Duplex suggests stenosis in the  left internal carotid artery (16-49%).  Duplex suggests stenosis in the left external carotid artery (<50%).  Antegrade right vertebral artery flow. Antegrade left vertebral artery flow.  Follow up in one year is appropriate if clinically indicated.v  Direct current cardioversion 12/04/2020 9:51 AM Indication symptomatic A. Fibrillation. Procedure: Using 50 mg of IV Propofol and 60 IV Lidocaine (for reducing venous pain) for achieving deep sedation, synchronized direct current cardioversion performed.  Patient was delivered with 120 Joules of electricity X 1 with success to NSR. Patient tolerated the procedure well. No immediate complication noted.   EKG:  ***  01/09/2021: Atrial fibrillation with ventricular rate of 111 bpm.  12/18/2020: Sinus bradycardia at a rate of 55 bpm with single PAC.  EKG 11/22/2020: Atrial fibrillation with controlled ventricular sponsor rate of 94 bpm.  Normal axis.  No evidence of ischemia or underlying injury pattern.  Assessment   No diagnosis found.    There are no discontinued medications.   No orders of the defined types were placed in this encounter.   This patients CHA2DS2-VASc Score 4 (HTN, A, F) and yearly risk of stroke 4.8%.    Recommendations:   Kristi Montgomery is a 87 y.o. Caucasian female with history of hypertension, chronic iron deficiency anemia, anxiety, and GERD, as well as history of tobacco use (quit in 2000) and family history of premature CAD.  She also has history of COVID-19 infection in early 09/2020.  Diagnosed with A. fib 09/2020.  Patient was started on Eliquis 10/23/2020.  Patient presents for 1 week follow-up of atrial fibrillation.  Patient was in our office last week for stress test and noted to be in A. fib with RVR, therefore given metoprolol 5 mg IV which improved rate control.  Given the recurrence of atrial fibrillation added amiodarone 20 mg twice daily at last office visit.  However she called after-hours line over the weekend with concerns of "not feeling well" and was advised to hold amiodarone and spironolactone. ***  ***  Patient was last seen for office visit 12/18/2020 at which time she was in sinus bradycardia.  Therefore discontinue diltiazem and switch her back to amlodipine for improved blood pressure control.  Also following that visit blood pressure remained elevated, therefore advised patient to resume spironolactone 12.5 mg daily, repeat BMP remained stable.  Patient was in our office today for stress  test and was in atrial fibrillation at about 130 bpm. Patient was therefore given metoprolol 5 mg IV to improve rate control.  Patient's heart rate came down to 110 bpm and her symptoms of weakness, dizziness, and dyspnea improved.  EKG today revealed atrial fibrillation at a rate of 111 bpm.  Patient's blood pressure is well controlled.  She is on anticoagulation and tolerating this without bleeding diathesis.  Given recurrence of atrial fibrillation we will add amiodarone 200 mg p.o. twice daily and bring patient back for repeat EKG in 1 week.  Counseled patient regarding signs symptoms that would warrant urgent or emergent evaluation.  Patient verbalized understanding agreement.  She is also accompanied by her daughter at today's office visit, who also verbalizes understanding and agreement.  Follow-up in 1 week, sooner if needed, for atrial fibrillation and repeat EKG.  Patient was seen in collaboration with Dr. Einar Gip. He also reviewed patient's chart and examined the patient. Dr. Einar Gip is in agreement of the plan.    Kristi Berthold, PA-C 01/14/2021, 2:21 PM Office: [phone]

## 2021-01-15 ENCOUNTER — Other Ambulatory Visit: Payer: Self-pay

## 2021-01-15 ENCOUNTER — Ambulatory Visit: Payer: Medicare Other | Admitting: Student

## 2021-01-15 ENCOUNTER — Encounter: Payer: Self-pay | Admitting: Student

## 2021-01-15 VITALS — BP 127/62 | HR 67 | Temp 98.0°F | Ht 63.0 in | Wt 131.6 lb

## 2021-01-15 DIAGNOSIS — R0602 Shortness of breath: Secondary | ICD-10-CM

## 2021-01-15 DIAGNOSIS — I480 Paroxysmal atrial fibrillation: Secondary | ICD-10-CM

## 2021-01-15 DIAGNOSIS — R5383 Other fatigue: Secondary | ICD-10-CM

## 2021-01-15 MED ORDER — AMIODARONE HCL 200 MG PO TABS: 200.0000 mg | ORAL_TABLET | Freq: Every day | ORAL | Status: DC

## 2021-01-15 NOTE — Progress Notes (Signed)
Primary Physician/Referring:  Shon Baton, MD  Patient ID: Kristi Montgomery, female    DOB: February 08, 1931, 87 y.o.   MRN: [id number]  Chief Complaint  Patient presents with   Atrial Fibrillation   Follow-up    HPI:    Kristi Montgomery  is a 87 y.o. Caucasian female with history of hypertension, chronic iron deficiency anemia, anxiety, and GERD, as well as history of tobacco use (quit in 2000) and family history of premature CAD.  She also has history of COVID-19 infection in early 09/2020.  Diagnosed with A. fib 09/2020.  Patient was started on Eliquis 10/23/2020.  Patient presents for 1 week follow-up of atrial fibrillation.  Patient was in our office last week for stress test and noted to be in A. fib with RVR, therefore given metoprolol 5 mg IV which improved rate control.  Given the recurrence of atrial fibrillation added amiodarone 200 mg twice daily at last office visit.  However she called after-hours line over the weekend with concerns of "not feeling well" and was advised to hold amiodarone and spironolactone.  Instead of discontinuing amiodarone and spironolactone patient has not been taking 100 mg amiodarone twice daily and continue to take spironolactone 12.5 mg once daily.  Patient is feeling better with reduced amiodarone dosing.  Heart rate control has improved although she does remain in atrial fibrillation.  Patient states she is feeling relatively well, however she does continue to have shortness of breath and fatigue.  She is tolerating anticoagulation without bleeding diathesis.  Denies chest pain, dizziness, palpitations, syncope, near syncope.  Past Medical History:  Diagnosis Date   Anxiety    Cancer ([place])    Melanoma   Cataract    Chest pain    myoview 04/01/12-normal   History of tobacco abuse    HTN (hypertension)    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/06/2017   Past Surgical History:  Procedure Laterality Date   CARDIOVERSION N/A 12/04/2020   Procedure: CARDIOVERSION;  Surgeon:  [name], MD;  Location: [facility] END[facility]COPY;  Service: Cardiovascular;  Laterality: N/A;   CHOLECYSTECTOMY     MELANOMA EXCISION     x5   Family History  Problem Relation Age of Onset   Congestive Heart Failure Mother    Heart failure Mother    Heart attack Father        age 39   Heart Problems Brother    Cancer Brother    Cancer Brother        pancreatic    Cancer Sister        breast    Cancer Sister        breast   Breast cancer Neg Hx     Social History   Tobacco Use   Smoking status: Former    Types: Cigarettes    Quit date: 02/25/1988    Years since quitting: 32.9   Smokeless tobacco: Never  Substance Use Topics   Alcohol use: No   Marital Status: Married   ROS  Review of Systems  Constitutional: Positive for malaise/fatigue. Negative for weight gain.  Cardiovascular:  Negative for chest pain, claudication, leg swelling, near-syncope, orthopnea, palpitations, paroxysmal nocturnal dyspnea and syncope.  Respiratory:  Positive for shortness of breath.    Objective  Blood pressure 127/62, pulse 67, temperature 98 F (36.7 C), temperature source Temporal, height 5\' 3"  (1.6 m), weight 131 lb 9.6 oz (59.7 kg).  Vitals with BMI 01/15/2021 01/09/2021 12/28/2020  Height 5\' 3"  5\' 3"  -  Weight  131 lbs 10 oz 128 lbs -  BMI 08.14 48.18 -  Systolic 563 149 702  Diastolic 62 66 61  Pulse 67 65 59      Physical Exam Vitals reviewed.  HENT:     Head: Normocephalic and atraumatic.  Cardiovascular:     Rate and Rhythm: Normal rate. Rhythm irregular.     Pulses: Intact distal pulses.          Carotid pulses are  on the left side with bruit.    Heart sounds: S1 normal and S2 normal. No murmur heard.   No gallop.  Pulmonary:     Effort: Pulmonary effort is normal. No respiratory distress.     Breath sounds: No wheezing, rhonchi or rales.  Musculoskeletal:     Right lower leg: Edema (minimal) present.     Left lower leg: Edema (minimal) present.  Neurological:     Mental  Status: She is alert.    Laboratory examination:   Recent Labs    10/23/20 1519 10/25/20 0257 11/15/20 1721 01/07/21 1007  NA 136 136 134* 134  K 3.3* 3.9 4.0 4.0  CL 106 104 101 95*  CO2 24 26 25 21   GLUCOSE 162* 139* 186* 187*  BUN 6* 8 13 12   CREATININE 0.65 0.71 0.85 0.91  CALCIUM 8.7* 8.9 9.0 9.4  GFRNONAA >60 >60 >60  --    estimated creatinine clearance is 35.3 mL/min (by C-G formula based on SCr of 0.91 mg/dL).  CMP Latest Ref Rng & Units 01/07/2021 11/15/2020 10/25/2020  Glucose 70 - 99 mg/dL 187(H) 186(H) 139(H)  BUN 8 - 27 mg/dL 12 13 8   Creatinine 0.57 - 1.00 mg/dL 0.91 0.85 0.71  Sodium 134 - 144 mmol/L 134 134(L) 136  Potassium 3.5 - 5.2 mmol/L 4.0 4.0 3.9  Chloride 96 - 106 mmol/L 95(L) 101 104  CO2 20 - 29 mmol/L 21 25 26   Calcium 8.7 - 10.3 mg/dL 9.4 9.0 8.9  Total Protein 6.0 - 8.5 g/dL - - -  Total Bilirubin 0.0 - 1.2 mg/dL - - -  Alkaline Phos 39 - 117 IU/L - - -  AST 0 - 40 IU/L - - -  ALT 0 - 32 IU/L - - -   CBC Latest Ref Rng & Units 11/15/2020 10/25/2020 10/24/2020  WBC 4.0 - 10.5 K/uL 7.2 4.7 5.0  Hemoglobin 12.0 - 15.0 g/dL 11.4(L) 10.6(L) 10.8(L)  Hematocrit 36.0 - 46.0 % 32.9(L) 30.0(L) 31.3(L)  Platelets 150 - 400 K/uL 250 293 317    Lipid Panel No results for input(s): CHOL, TRIG, LDLCALC, VLDL, HDL, CHOLHDL, LDLDIRECT in the last 8760 hours.  HEMOGLOBIN A1C Lab Results  Component Value Date   HGBA1C 5.6 10/23/2020   MPG 114 10/23/2020   TSH Recent Labs    10/23/20 1519  TSH 1.149    External labs:  None   Allergies   Allergies  Allergen Reactions   Amoxicillin Swelling and Other (See Comments)    Tongue swelling  Has patient had a PCN reaction causing immediate rash, facial/tongue/throat swelling, SOB or lightheadedness with hypotension: Yes Has patient had a PCN reaction causing severe rash involving mucus membranes or skin necrosis: Unknown Has patient had a PCN reaction that required hospitalization: Unknown Has  patient had a PCN reaction occurring within the last 10 years: Unknown If all of the above answers are "NO", then may proceed with Cephalosporin use.    Amoxicillin-Pot Clavulanate Swelling and Other (See Comments)    Swollen  and red tongue   Codeine Nausea And Vomiting   Latex Rash and Other (See Comments)    NO BAND-AIDS!!!!   Tape Rash and Other (See Comments)    NO BAND-AIDS OR TAPE!!!!    Medications Prior to Visit:   Outpatient Medications Prior to Visit  Medication Sig Dispense Refill   ALPRAZolam (XANAX) 0.25 MG tablet Take 0.125 mg by mouth 2 (two) times daily as needed for anxiety.     amLODipine (NORVASC) 10 MG tablet Take 1 tablet (10 mg total) by mouth daily. 90 tablet 3   apixaban (ELIQUIS) 2.5 MG TABS tablet Take 1 tablet (2.5 mg total) by mouth 2 (two) times daily. 60 tablet 0   Cholecalciferol (VITAMIN D-3) 25 MCG (1000 UT) CAPS Take 1,000 Units by mouth daily.     ferrous sulfate 325 (65 FE) MG tablet Take 325 mg by mouth every other day.     MAGNESIUM PO Take 1 tablet by mouth daily.     metoprolol tartrate (LOPRESSOR) 50 MG tablet Take 50 mg by mouth 2 (two) times daily.     Olopatadine HCl (PATADAY OP) Place 1 drop into both eyes daily at 12 noon.     omeprazole (PRILOSEC) 40 MG capsule Take 40 mg by mouth daily as needed (heartburn).     REFRESH OPTIVE ADVANCED PF 0.5-1-0.5 % SOLN Place 1 drop into both eyes at bedtime.     spironolactone (ALDACTONE) 25 MG tablet Take 0.5 tablets (12.5 mg total) by mouth daily. 45 tablet 3   SYSTANE ULTRA PF 0.4-0.3 % SOLN Place 1 drop into both eyes in the morning, at noon, in the evening, and at bedtime.     vitamin B-12 (CYANOCOBALAMIN) 1000 MCG tablet Take 1,000 mcg by mouth daily.     VITAMIN E PO Take 1 capsule by mouth daily.     zolpidem (AMBIEN) 10 MG tablet Take 0.5 tablets by mouth at bedtime as needed.     amiodarone (PACERONE) 200 MG tablet Take 1 tablet (200 mg total) by mouth 2 (two) times daily. 60 tablet 3   No  facility-administered medications prior to visit.   Final Medications at End of Visit    Current Meds  Medication Sig   ALPRAZolam (XANAX) 0.25 MG tablet Take 0.125 mg by mouth 2 (two) times daily as needed for anxiety.   amLODipine (NORVASC) 10 MG tablet Take 1 tablet (10 mg total) by mouth daily.   apixaban (ELIQUIS) 2.5 MG TABS tablet Take 1 tablet (2.5 mg total) by mouth 2 (two) times daily.   Cholecalciferol (VITAMIN D-3) 25 MCG (1000 UT) CAPS Take 1,000 Units by mouth daily.   ferrous sulfate 325 (65 FE) MG tablet Take 325 mg by mouth every other day.   MAGNESIUM PO Take 1 tablet by mouth daily.   metoprolol tartrate (LOPRESSOR) 50 MG tablet Take 50 mg by mouth 2 (two) times daily.   Olopatadine HCl (PATADAY OP) Place 1 drop into both eyes daily at 12 noon.   omeprazole (PRILOSEC) 40 MG capsule Take 40 mg by mouth daily as needed (heartburn).   REFRESH OPTIVE ADVANCED PF 0.5-1-0.5 % SOLN Place 1 drop into both eyes at bedtime.   spironolactone (ALDACTONE) 25 MG tablet Take 0.5 tablets (12.5 mg total) by mouth daily.   SYSTANE ULTRA PF 0.4-0.3 % SOLN Place 1 drop into both eyes in the morning, at noon, in the evening, and at bedtime.   vitamin B-12 (CYANOCOBALAMIN) 1000 MCG tablet Take 1,000 mcg  by mouth daily.   VITAMIN E PO Take 1 capsule by mouth daily.   zolpidem (AMBIEN) 10 MG tablet Take 0.5 tablets by mouth at bedtime as needed.   [DISCONTINUED] amiodarone (PACERONE) 200 MG tablet Take 1 tablet (200 mg total) by mouth 2 (two) times daily.   Radiology:   No results found.  Cardiac Studies:   Echocardiogram 10/23/2020:   1. Left ventricular ejection fraction, by estimation, is 60 to 65%. The  left ventricle has normal function. The left ventricle has no regional  wall motion abnormalities. Left ventricular diastolic function could not  be evaluated.   2. Right ventricular systolic function is normal. The right ventricular  size is normal. There is normal pulmonary artery  systolic pressure.   3. Left atrial size was mildly dilated.   4. The mitral valve is normal in structure. Trivial mitral valve  regurgitation.   5. Tricuspid valve regurgitation is moderate.   6. The aortic valve is tricuspid. There is mild calcification of the  aortic valve. There is mild thickening of the aortic valve. Aortic valve  regurgitation is not visualized. Mild to moderate aortic valve  sclerosis/calcification is present, without any  evidence of aortic stenosis.   7. The inferior vena cava is dilated in size with >50% respiratory  variability, suggesting right atrial pressure of 8 mmHg.   Carotid artery duplex 11/29/2020:  No evidence of significant stenosis in the right carotid vessels.  Duplex suggests stenosis in the left internal carotid artery (16-49%).  Duplex suggests stenosis in the left external carotid artery (<50%).  Antegrade right vertebral artery flow. Antegrade left vertebral artery flow.  Follow up in one year is appropriate if clinically indicated.v  Direct current cardioversion 12/04/2020 9:51 AM Indication symptomatic A. Fibrillation. Procedure: Using 50 mg of IV Propofol and 60 IV Lidocaine (for reducing venous pain) for achieving deep sedation, synchronized direct current cardioversion performed. Patient was delivered with 120 Joules of electricity X 1 with success to NSR. Patient tolerated the procedure well. No immediate complication noted.   EKG:  01/15/2021: Atrial fibrillation with controlled ventricular response at a rate of 70 bpm.  01/09/2021: Atrial fibrillation with ventricular rate of 111 bpm.  12/18/2020: Sinus bradycardia at a rate of 55 bpm with single PAC.  EKG 11/22/2020: Atrial fibrillation with controlled ventricular sponsor rate of 94 bpm.  Normal axis.  No evidence of ischemia or underlying injury pattern.  Assessment     ICD-10-CM   1. Paroxysmal A-fib (HCC)  I48.0 EKG 12-Lead    2. Other fatigue  R53.83     3. Shortness of  breath  R06.02         Medications Discontinued During This Encounter  Medication Reason   amiodarone (PACERONE) 200 MG tablet      Meds ordered this encounter  Medications   amiodarone (PACERONE) 200 MG tablet    Sig: Take 1 tablet (200 mg total) by mouth daily.    This patients CHA2DS2-VASc Score 4 (HTN, A, F) and yearly risk of stroke 4.8%.    Recommendations:   Kristi Montgomery is a 87 y.o. Caucasian female with history of hypertension, chronic iron deficiency anemia, anxiety, and GERD, as well as history of tobacco use (quit in 2000) and family history of premature CAD.  She also has history of COVID-19 infection in early 09/2020.  Diagnosed with A. fib 09/2020.  Patient was started on Eliquis 10/23/2020.  Patient presents for 1 week follow-up of atrial fibrillation.  Patient  was in our office last week for stress test and noted to be in A. fib with RVR, therefore given metoprolol 5 mg IV which improved rate control.  Given the recurrence of atrial fibrillation added amiodarone 20 mg twice daily at last office visit.  However she called after-hours line over the weekend with concerns of "not feeling well" and was advised to hold amiodarone and spironolactone.  Patient has instead been tolerating continued use of spironolactone as well as amiodarone 100 mg twice daily.  We will therefore reduce amiodarone to 200 mg once daily and advised patient to continue spironolactone 12.5 mg daily as blood pressure is well controlled.  EKG shows atrial fibrillation which is now well rate controlled.  Discussed with patient that her continued dyspnea and fatigue may be related to underlying atrial fibrillation, therefore would consider repeat direct-current cardioversion, particularly now that she is on amiodarone.  Patient is feeling well overall and is hesitant to undergo repeat cardioversion.  Discussed at length with patient and her daughter risks versus benefits versus alternatives.  Patient would  like to think about cardioversion prior to scheduling as she is feeling so well currently.  Will plan to follow-up in 8 weeks, sooner if needed, for atrial fibrillation.  In the meantime patient will notify our office of her decision regarding cardioversion.   Alethia Berthold, PA-C 01/15/2021, 1:03 PM Office: [phone]

## 2021-01-21 ENCOUNTER — Ambulatory Visit: Payer: Medicare Other | Admitting: Cardiology

## 2021-01-21 ENCOUNTER — Telehealth: Payer: Self-pay

## 2021-01-21 NOTE — Telephone Encounter (Signed)
Advised patient to avoid salt intake, wear compression stockings and elevated legs. If swelling continues over next 3 days let our office know could consider ordering BNP.

## 2021-01-21 NOTE — Telephone Encounter (Signed)
Pt called and stated that her feet and legs have been swollen since around 01/17/2021. She wants to know if there is anything else she can to to help with the swelling. She has elevated her feet. Please advise.

## 2021-01-22 NOTE — Telephone Encounter (Signed)
Called pt, no answer. Left vm requesting call back?

## 2021-01-23 NOTE — Telephone Encounter (Signed)
Called pt no answer, left a vm

## 2021-01-23 NOTE — Telephone Encounter (Signed)
Second attempt to contact pt, no answer. Left vm requesting call back.

## 2021-01-28 ENCOUNTER — Ambulatory Visit: Payer: Medicare Other | Admitting: Cardiology

## 2021-01-29 ENCOUNTER — Ambulatory Visit: Payer: Medicare Other | Admitting: Student

## 2021-01-31 ENCOUNTER — Other Ambulatory Visit: Payer: Self-pay

## 2021-01-31 ENCOUNTER — Telehealth: Payer: Self-pay

## 2021-01-31 DIAGNOSIS — R0602 Shortness of breath: Secondary | ICD-10-CM

## 2021-01-31 DIAGNOSIS — I6523 Occlusion and stenosis of bilateral carotid arteries: Secondary | ICD-10-CM

## 2021-01-31 DIAGNOSIS — I10 Essential (primary) hypertension: Secondary | ICD-10-CM

## 2021-01-31 DIAGNOSIS — I480 Paroxysmal atrial fibrillation: Secondary | ICD-10-CM

## 2021-01-31 DIAGNOSIS — R600 Localized edema: Secondary | ICD-10-CM

## 2021-01-31 MED ORDER — FUROSEMIDE 20 MG PO TABS: 20.0000 mg | ORAL_TABLET | Freq: Every day | ORAL | 3 refills | Status: DC | PRN

## 2021-01-31 NOTE — Telephone Encounter (Signed)
We will also add Lasix 20 mg daily as needed.  Advised her to take Lasix 20 mg once a day for the next 1 week.  Her A. fib may be driving volume overload, and she is not currently scheduled for follow-up visit with our office.  Please have patient set up visit in the next week or so

## 2021-01-31 NOTE — Telephone Encounter (Signed)
Patient called and stated that she still is experiencing swelling in her legs. When she weighed this morning she noticed that she had gained around 10 pounds. Patient said she hardly ever has salt and sleeps with her legs elevated. She also said she never has time to sit down and raise her legs. I have placed lab orders to check BNP and BMP per CC. Pt will try to go and get the labs done today.  She wants to know if there is anything else she can do in the mean time. Please advise.

## 2021-01-31 NOTE — Telephone Encounter (Signed)
Called and spoke to pt, pt transferred to the front to schedule an appointment.

## 2021-02-01 LAB — BASIC METABOLIC PANEL
BUN/Creatinine Ratio: 14 (ref 12–28)
BUN: 11 mg/dL (ref 8–27)
CO2: 25 mmol/L (ref 20–29)
Calcium: 9.7 mg/dL (ref 8.7–10.3)
Chloride: 99 mmol/L (ref 96–106)
Creatinine, Ser: 0.79 mg/dL (ref 0.57–1.00)
Glucose: 133 mg/dL — ABNORMAL HIGH (ref 70–99)
Potassium: 3.8 mmol/L (ref 3.5–5.2)
Sodium: 138 mmol/L (ref 134–144)
eGFR: 72 mL/min/{1.73_m2} (ref >59)

## 2021-02-01 LAB — PRO B NATRIURETIC PEPTIDE: NT-Pro BNP: 2029 pg/mL — ABNORMAL HIGH (ref 0–738)

## 2021-02-01 NOTE — Progress Notes (Signed)
Called and spoke to pt, pt voiced understanding.

## 2021-02-06 NOTE — Progress Notes (Signed)
Primary Physician/Referring:  Shon Baton, MD  Patient ID: Kristi Montgomery, female    DOB: [birth year], 87 y.o.   MRN: [id number]  Chief Complaint  Patient presents with   Leg Swelling   Atrial Fibrillation   Follow-up   sleepiness    HPI:    Kristi Montgomery  is a 87 y.o. Caucasian female with history of hypertension, chronic iron deficiency anemia, anxiety, and GERD, as well as history of tobacco use (quit in 2000) and family history of premature CAD.  She also has history of COVID-19 infection in early 09/2020.  Diagnosed with A. fib 09/2020.  Patient was started on Eliquis 10/23/2020.  Patient presents for follow-up with concerns of bilateral leg edema worsening since last office visit.  Patient was last seen in the office 01/15/2021, at which time she remained in atrial fibrillation normal rate controlled.  She was advised at that time to undergo repeat cardioversion in order to improve symptoms of dyspnea and fatigue as well as avoid recurrent volume overload.  Patient was very resistant to cardioversion at last office visit and therefore opted to hold off and reevaluate after January 2023.  However since then patient has now called the office twice with concerns of bilateral leg edema, at that time proBNP Elevated 2029 and BMP remained stable. Patient has been taking Lasix 40 mg p.o. twice daily.  Leg edema has significantly improved.  She denies chest pain, dyspnea, syncope, near syncope.  Denies orthopnea, PND.  She does continue to complain of fatigue.  Past Medical History:  Diagnosis Date   Anxiety    Cancer ([place])    Melanoma   Cataract    Chest pain    myoview 04/01/12-normal   History of tobacco abuse    HTN (hypertension)    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/06/2017   Past Surgical History:  Procedure Laterality Date   CARDIOVERSION N/A 12/04/2020   Procedure: CARDIOVERSION;  Surgeon: [name], MD;  Location: [facility] ENDOSCOPY;  Service: Cardiovascular;  Laterality: N/A;   CHOLECYSTECTOMY      MELANOMA EXCISION     x5   Family History  Problem Relation Age of Onset   Congestive Heart Failure Mother    Heart failure Mother    Heart attack Father        age 64   Heart Problems Brother    Cancer Brother    Cancer Brother        pancreatic    Cancer Sister        breast    Cancer Sister        breast   Breast cancer Neg Hx     Social History   Tobacco Use   Smoking status: Former    Types: Cigarettes    Quit date: 02/25/1988    Years since quitting: 32.9   Smokeless tobacco: Never  Substance Use Topics   Alcohol use: No   Marital Status: Married   ROS  Review of Systems  Constitutional: Positive for malaise/fatigue. Negative for weight gain.  Cardiovascular:  Positive for leg swelling (improved). Negative for chest pain, claudication, near-syncope, orthopnea, palpitations, paroxysmal nocturnal dyspnea and syncope.  Respiratory:  Negative for shortness of breath.    Objective  Blood pressure 123/62, pulse 63, temperature 97.8 F (36.6 C), temperature source Temporal, height 5\' 3"  (1.6 m), weight 130 lb (59 kg), SpO2 99 %.  Vitals with BMI 02/07/2021 01/15/2021 01/09/2021  Height 5\' 3"  5\' 3"  5\' 3"   Weight 130 lbs 131  lbs 10 oz 128 lbs  BMI 23.03 23.53 61.44  Systolic 315 400 867  Diastolic 62 62 66  Pulse 63 67 65     Physical Exam Vitals reviewed.  HENT:     Head: Normocephalic and atraumatic.  Cardiovascular:     Rate and Rhythm: Normal rate. Rhythm irregular.     Pulses: Intact distal pulses.          Carotid pulses are  on the left side with bruit.    Heart sounds: S1 normal and S2 normal. No murmur heard.   No gallop.  Pulmonary:     Effort: Pulmonary effort is normal. No respiratory distress.     Breath sounds: No wheezing, rhonchi or rales.  Musculoskeletal:     Right lower leg: Edema (1+ pitting) present.     Left lower leg: Edema (1+ pitting) present.  Neurological:     Mental Status: She is alert.   Laboratory examination:    Recent Labs    10/23/20 1519 10/25/20 0257 11/15/20 1721 01/07/21 1007 01/31/21 1317  NA 136 136 134* 134 138  K 3.3* 3.9 4.0 4.0 3.8  CL 106 104 101 95* 99  CO2 24 26 25 21 25   GLUCOSE 162* 139* 186* 187* 133*  BUN 6* 8 13 12 11   CREATININE 0.65 0.71 0.85 0.91 0.79  CALCIUM 8.7* 8.9 9.0 9.4 9.7  GFRNONAA >60 >60 >60  --   --    estimated creatinine clearance is 40.2 mL/min (by C-G formula based on SCr of 0.79 mg/dL).  CMP Latest Ref Rng & Units 01/31/2021 01/07/2021 11/15/2020  Glucose 70 - 99 mg/dL 133(H) 187(H) 186(H)  BUN 8 - 27 mg/dL 11 12 13   Creatinine 0.57 - 1.00 mg/dL 0.79 0.91 0.85  Sodium 134 - 144 mmol/L 138 134 134(L)  Potassium 3.5 - 5.2 mmol/L 3.8 4.0 4.0  Chloride 96 - 106 mmol/L 99 95(L) 101  CO2 20 - 29 mmol/L 25 21 25   Calcium 8.7 - 10.3 mg/dL 9.7 9.4 9.0  Total Protein 6.0 - 8.5 g/dL - - -  Total Bilirubin 0.0 - 1.2 mg/dL - - -  Alkaline Phos 39 - 117 IU/L - - -  AST 0 - 40 IU/L - - -  ALT 0 - 32 IU/L - - -   CBC Latest Ref Rng & Units 11/15/2020 10/25/2020 10/24/2020  WBC 4.0 - 10.5 K/uL 7.2 4.7 5.0  Hemoglobin 12.0 - 15.0 g/dL 11.4(L) 10.6(L) 10.8(L)  Hematocrit 36.0 - 46.0 % 32.9(L) 30.0(L) 31.3(L)  Platelets 150 - 400 K/uL 250 293 317    Lipid Panel No results for input(s): CHOL, TRIG, LDLCALC, VLDL, HDL, CHOLHDL, LDLDIRECT in the last 8760 hours.  HEMOGLOBIN A1C Lab Results  Component Value Date   HGBA1C 5.6 10/23/2020   MPG 114 10/23/2020   TSH Recent Labs    10/23/20 1519  TSH 1.149  External labs:  None   Allergies   Allergies  Allergen Reactions   Amoxicillin Swelling and Other (See Comments)    Tongue swelling  Has patient had a PCN reaction causing immediate rash, facial/tongue/throat swelling, SOB or lightheadedness with hypotension: Yes Has patient had a PCN reaction causing severe rash involving mucus membranes or skin necrosis: Unknown Has patient had a PCN reaction that required hospitalization: Unknown Has patient  had a PCN reaction occurring within the last 10 years: Unknown If all of the above answers are "NO", then may proceed with Cephalosporin use.    Amoxicillin-Pot Clavulanate  Swelling and Other (See Comments)    Swollen and red tongue   Codeine Nausea And Vomiting   Latex Rash and Other (See Comments)    NO BAND-AIDS!!!!   Tape Rash and Other (See Comments)    NO BAND-AIDS OR TAPE!!!!    Medications Prior to Visit:   Outpatient Medications Prior to Visit  Medication Sig Dispense Refill   ALPRAZolam (XANAX) 0.25 MG tablet Take 0.125 mg by mouth 2 (two) times daily as needed for anxiety.     amiodarone (PACERONE) 200 MG tablet Take 1 tablet (200 mg total) by mouth daily.     amLODipine (NORVASC) 10 MG tablet Take 1 tablet (10 mg total) by mouth daily. 90 tablet 3   apixaban (ELIQUIS) 2.5 MG TABS tablet Take 1 tablet (2.5 mg total) by mouth 2 (two) times daily. 60 tablet 0   Cholecalciferol (VITAMIN D-3) 25 MCG (1000 UT) CAPS Take 1,000 Units by mouth daily.     ferrous sulfate 325 (65 FE) MG tablet Take 325 mg by mouth every other day.     MAGNESIUM PO Take 1 tablet by mouth daily.     metoprolol tartrate (LOPRESSOR) 50 MG tablet Take 50 mg by mouth 2 (two) times daily.     Olopatadine HCl (PATADAY OP) Place 1 drop into both eyes daily at 12 noon.     omeprazole (PRILOSEC) 40 MG capsule Take 40 mg by mouth daily as needed (heartburn).     REFRESH OPTIVE ADVANCED PF 0.5-1-0.5 % SOLN Place 1 drop into both eyes at bedtime.     SYSTANE ULTRA PF 0.4-0.3 % SOLN Place 1 drop into both eyes in the morning, at noon, in the evening, and at bedtime.     vitamin B-12 (CYANOCOBALAMIN) 1000 MCG tablet Take 1,000 mcg by mouth daily.     VITAMIN E PO Take 1 capsule by mouth daily.     zolpidem (AMBIEN) 10 MG tablet Take 0.5 tablets by mouth at bedtime as needed.     furosemide (LASIX) 20 MG tablet Take 1 tablet (20 mg total) by mouth daily as needed for fluid or edema. 30 tablet 3   spironolactone  (ALDACTONE) 25 MG tablet Take 0.5 tablets (12.5 mg total) by mouth daily. (Patient not taking: Reported on 02/07/2021) 45 tablet 3   No facility-administered medications prior to visit.   Final Medications at End of Visit    Current Meds  Medication Sig   ALPRAZolam (XANAX) 0.25 MG tablet Take 0.125 mg by mouth 2 (two) times daily as needed for anxiety.   amiodarone (PACERONE) 200 MG tablet Take 1 tablet (200 mg total) by mouth daily.   amLODipine (NORVASC) 10 MG tablet Take 1 tablet (10 mg total) by mouth daily.   apixaban (ELIQUIS) 2.5 MG TABS tablet Take 1 tablet (2.5 mg total) by mouth 2 (two) times daily.   Cholecalciferol (VITAMIN D-3) 25 MCG (1000 UT) CAPS Take 1,000 Units by mouth daily.   ferrous sulfate 325 (65 FE) MG tablet Take 325 mg by mouth every other day.   MAGNESIUM PO Take 1 tablet by mouth daily.   metoprolol tartrate (LOPRESSOR) 50 MG tablet Take 50 mg by mouth 2 (two) times daily.   Olopatadine HCl (PATADAY OP) Place 1 drop into both eyes daily at 12 noon.   omeprazole (PRILOSEC) 40 MG capsule Take 40 mg by mouth daily as needed (heartburn).   REFRESH OPTIVE ADVANCED PF 0.5-1-0.5 % SOLN Place 1 drop into both eyes at bedtime.  SYSTANE ULTRA PF 0.4-0.3 % SOLN Place 1 drop into both eyes in the morning, at noon, in the evening, and at bedtime.   vitamin B-12 (CYANOCOBALAMIN) 1000 MCG tablet Take 1,000 mcg by mouth daily.   VITAMIN E PO Take 1 capsule by mouth daily.   zolpidem (AMBIEN) 10 MG tablet Take 0.5 tablets by mouth at bedtime as needed.   [DISCONTINUED] furosemide (LASIX) 20 MG tablet Take 1 tablet (20 mg total) by mouth daily as needed for fluid or edema.   Radiology:   No results found.  Cardiac Studies:   Echocardiogram 10/23/2020:   1. Left ventricular ejection fraction, by estimation, is 60 to 65%. The  left ventricle has normal function. The left ventricle has no regional  wall motion abnormalities. Left ventricular diastolic function could not   be evaluated.   2. Right ventricular systolic function is normal. The right ventricular  size is normal. There is normal pulmonary artery systolic pressure.   3. Left atrial size was mildly dilated.   4. The mitral valve is normal in structure. Trivial mitral valve  regurgitation.   5. Tricuspid valve regurgitation is moderate.   6. The aortic valve is tricuspid. There is mild calcification of the  aortic valve. There is mild thickening of the aortic valve. Aortic valve  regurgitation is not visualized. Mild to moderate aortic valve  sclerosis/calcification is present, without any  evidence of aortic stenosis.   7. The inferior vena cava is dilated in size with >50% respiratory  variability, suggesting right atrial pressure of 8 mmHg.   Carotid artery duplex 11/29/2020:  No evidence of significant stenosis in the right carotid vessels.  Duplex suggests stenosis in the left internal carotid artery (16-49%).  Duplex suggests stenosis in the left external carotid artery (<50%).  Antegrade right vertebral artery flow. Antegrade left vertebral artery flow.  Follow up in one year is appropriate if clinically indicated.v  Direct current cardioversion 12/04/2020 9:51 AM Indication symptomatic A. Fibrillation. Procedure: Using 50 mg of IV Propofol and 60 IV Lidocaine (for reducing venous pain) for achieving deep sedation, synchronized direct current cardioversion performed. Patient was delivered with 120 Joules of electricity X 1 with success to NSR. Patient tolerated the procedure well. No immediate complication noted.   PCV MYOCARDIAL PERFUSION WITH LEXISCAN 01/09/2021 Nondiagnostic ECG stress. Resting AF with RVR, no change with Lexiscan infusion. Myocardial perfusion is normal. Overall LV systolic function is normal without regional wall motion abnormalities. Stress LV EF: 42% although visually the LVEF is normal in size and function. No previous exam available for comparison. Low risk  study.  EKG:  02/07/2021: Atrial fibrillation with controlled ventricular response at a rate of 76 bpm.  01/15/2021: Atrial fibrillation with controlled ventricular response at a rate of 70 bpm.  01/09/2021: Atrial fibrillation with ventricular rate of 111 bpm.  12/18/2020: Sinus bradycardia at a rate of 55 bpm with single PAC.  EKG 11/22/2020: Atrial fibrillation with controlled ventricular sponsor rate of 94 bpm.  Normal axis.  No evidence of ischemia or underlying injury pattern.  Assessment     ICD-10-CM   1. Paroxysmal A-fib (HCC)  I48.0 EKG 12-Lead    CBC    2. Leg edema  R60.0 EKG 12-Lead    3. Dyspnea on exertion  R06.09 Brain natriuretic peptide    Basic metabolic panel    CBC    PCV ECHOCARDIOGRAM COMPLETE    4. Bilateral leg edema  R60.0 Brain natriuretic peptide    Basic metabolic  panel    PCV ECHOCARDIOGRAM COMPLETE        Medications Discontinued During This Encounter  Medication Reason   furosemide (LASIX) 20 MG tablet Reorder     Meds ordered this encounter  Medications   furosemide (LASIX) 40 MG tablet    Sig: Take 1 tablet (40 mg total) by mouth 2 (two) times daily as needed for fluid or edema.    Dispense:  60 tablet    Refill:  3   This patients CHA2DS2-VASc Score 4 (HTN, A, F) and yearly risk of stroke 4.8%.   Recommendations:   Kristi Montgomery is a 87 y.o. Caucasian female with history of hypertension, chronic iron deficiency anemia, anxiety, and GERD, as well as history of tobacco use (quit in 2000) and family history of premature CAD.  She also has history of COVID-19 infection in early 09/2020.  Diagnosed with A. fib 09/2020.  Patient was started on Eliquis 10/23/2020.  Patient presents for follow-up with concerns of bilateral leg edema worsening since last office visit.  Patient was last seen in the office 01/15/2021, at which time she remained in atrial fibrillation normal rate controlled.  She was advised at that time to undergo repeat  cardioversion in order to improve symptoms of dyspnea and fatigue as well as avoid recurrent volume overload.  Patient was very resistant to cardioversion at last office visit and therefore opted to hold off and reevaluate after January 2023.  However since then patient has now called the office twice with concerns of bilateral leg edema, at that time proBNP Elevated 2029 and BMP remained stable. Patient has been taking Lasix 40 mg p.o. twice daily.  We will repeat BMP and proBNP at this time.  As well as CBC in preparation for cardioversion.  Suspect recurrent acute volume overload is related to underlying atrial fibrillation, likely her fatigue is as well.  Patient had previously been resistant to cardioversion, however she is now open to this.  Discussed at length with patient indication, risk, benefits, and alternatives of cardioversion, she verbalized understanding and wishes to proceed.  For now we will continue Lasix 40 mg twice daily.  We will also obtain repeat echocardiogram given recurrent volume overload over the last few weeks.  Follow-up in 6 weeks, sooner if needed, for cardioversion follow-up, atrial fibrillation, and leg edema.   Alethia Berthold, PA-C 02/07/2021, 3:10 PM Office: [phone]

## 2021-02-07 ENCOUNTER — Encounter: Payer: Self-pay | Admitting: Student

## 2021-02-07 ENCOUNTER — Other Ambulatory Visit: Payer: Self-pay

## 2021-02-07 ENCOUNTER — Ambulatory Visit: Payer: Medicare Other | Admitting: Student

## 2021-02-07 VITALS — BP 123/62 | HR 63 | Temp 97.8°F | Ht 63.0 in | Wt 130.0 lb

## 2021-02-07 DIAGNOSIS — I480 Paroxysmal atrial fibrillation: Secondary | ICD-10-CM

## 2021-02-07 DIAGNOSIS — R600 Localized edema: Secondary | ICD-10-CM

## 2021-02-07 DIAGNOSIS — R0609 Other forms of dyspnea: Secondary | ICD-10-CM

## 2021-02-07 MED ORDER — FUROSEMIDE 40 MG PO TABS: 40.0000 mg | ORAL_TABLET | Freq: Two times a day (BID) | ORAL | 3 refills | Status: DC | PRN

## 2021-02-08 LAB — CBC
Hematocrit: 35.3 % (ref 34.0–46.6)
Hemoglobin: 12.3 g/dL (ref 11.1–15.9)
MCH: 36 pg — ABNORMAL HIGH (ref 26.6–33.0)
MCHC: 34.8 g/dL (ref 31.5–35.7)
MCV: 103 fL — ABNORMAL HIGH (ref 79–97)
Platelets: 293 10*3/uL (ref 150–450)
RBC: 3.42 x10E6/uL — ABNORMAL LOW (ref 3.77–5.28)
RDW: 15.8 % — ABNORMAL HIGH (ref 11.7–15.4)
WBC: 8.3 10*3/uL (ref 3.4–10.8)

## 2021-02-08 LAB — BASIC METABOLIC PANEL
BUN/Creatinine Ratio: 18 (ref 12–28)
BUN: 17 mg/dL (ref 8–27)
CO2: 27 mmol/L (ref 20–29)
Calcium: 9.8 mg/dL (ref 8.7–10.3)
Chloride: 90 mmol/L — ABNORMAL LOW (ref 96–106)
Creatinine, Ser: 0.94 mg/dL (ref 0.57–1.00)
Glucose: 133 mg/dL — ABNORMAL HIGH (ref 70–99)
Potassium: 3.2 mmol/L — ABNORMAL LOW (ref 3.5–5.2)
Sodium: 136 mmol/L (ref 134–144)
eGFR: 58 mL/min/{1.73_m2} — ABNORMAL LOW (ref >59)

## 2021-02-08 LAB — BRAIN NATRIURETIC PEPTIDE: BNP: 308.4 pg/mL — ABNORMAL HIGH (ref 0.0–100.0)

## 2021-02-21 ENCOUNTER — Encounter (HOSPITAL_COMMUNITY): Payer: Self-pay | Admitting: Cardiology

## 2021-02-21 NOTE — Progress Notes (Signed)
Attempted to obtain medical history via telephone, unable to reach at this time. I left a voicemail to return pre surgical testing department's phone call.  

## 2021-02-28 ENCOUNTER — Ambulatory Visit: Payer: Medicare Other

## 2021-02-28 ENCOUNTER — Other Ambulatory Visit: Payer: Self-pay

## 2021-02-28 DIAGNOSIS — R600 Localized edema: Secondary | ICD-10-CM

## 2021-02-28 DIAGNOSIS — R0609 Other forms of dyspnea: Secondary | ICD-10-CM

## 2021-03-04 ENCOUNTER — Telehealth: Payer: Self-pay

## 2021-03-04 NOTE — Telephone Encounter (Signed)
They will do an EKG prior to cardioversion to confirm heart rhythm and whether cardioversion is appropriate.

## 2021-03-04 NOTE — Telephone Encounter (Signed)
HR over the last few days were as followed : 58, 57, 52 and yesterday HR was 48 and she felt bad. Patient stated that she will be having a cardioversion tomorrow and wants to make that having a low HR will affect cardioversion from getting done. Please advise.

## 2021-03-05 ENCOUNTER — Other Ambulatory Visit: Payer: Medicare Other

## 2021-03-05 ENCOUNTER — Ambulatory Visit (HOSPITAL_COMMUNITY): Payer: Medicare Other | Admitting: Anesthesiology

## 2021-03-05 ENCOUNTER — Ambulatory Visit (HOSPITAL_COMMUNITY)
Admission: RE | Admit: 2021-03-05 | Discharge: 2021-03-05 | Disposition: A | Discharge status: Home / Self Care | Payer: Medicare Other | Attending: Cardiology | Admitting: Cardiology

## 2021-03-05 ENCOUNTER — Encounter (HOSPITAL_COMMUNITY): Payer: Self-pay | Admitting: Cardiology

## 2021-03-05 ENCOUNTER — Encounter (HOSPITAL_COMMUNITY)
Admission: RE | Disposition: A | Discharge status: Home / Self Care | Payer: Self-pay | Source: Home / Self Care | Attending: Cardiology

## 2021-03-05 DIAGNOSIS — Z538 Procedure and treatment not carried out for other reasons: Secondary | ICD-10-CM | POA: Diagnosis present

## 2021-03-05 SURGERY — CANCELLED PROCEDURE

## 2021-03-19 ENCOUNTER — Other Ambulatory Visit: Payer: Self-pay

## 2021-03-19 ENCOUNTER — Ambulatory Visit: Payer: Medicare Other | Admitting: Student

## 2021-03-19 ENCOUNTER — Encounter: Payer: Self-pay | Admitting: Student

## 2021-03-19 ENCOUNTER — Telehealth: Payer: Self-pay

## 2021-03-19 VITALS — BP 146/57 | HR 53 | Ht 63.0 in | Wt 130.0 lb

## 2021-03-19 DIAGNOSIS — I10 Essential (primary) hypertension: Secondary | ICD-10-CM

## 2021-03-19 DIAGNOSIS — R0609 Other forms of dyspnea: Secondary | ICD-10-CM

## 2021-03-19 DIAGNOSIS — I480 Paroxysmal atrial fibrillation: Secondary | ICD-10-CM

## 2021-03-19 DIAGNOSIS — R5383 Other fatigue: Secondary | ICD-10-CM

## 2021-03-19 NOTE — Telephone Encounter (Signed)
Called and spoke to pt, pt voiced understanding and will be here today at 1:30 pm.

## 2021-03-19 NOTE — Progress Notes (Signed)
Primary Physician/Referring:  Shon Baton, MD  Patient ID: Kristi Montgomery, female    DOB: [birth year], 88 y.o.   MRN: [id number]  Chief Complaint  Patient presents with   Bradycardia   Follow-up    HPI:    Kristi Montgomery  is a 88 y.o. Caucasian female with history of hypertension, chronic iron deficiency anemia, anxiety, and GERD, as well as history of tobacco use (quit in 2000) and family history of premature CAD.  She also has history of COVID-19 infection in early 09/2020.  Diagnosed with A. fib 09/2020.  Patient was started on Eliquis 10/23/2020.  Due to recurrence of atrial fibrillation and recurrent heart failure/volume overload shared decision was to proceed with cardioversion.  However when patient presented for cardioversion on 03/05/2021 she had spontaneously converted to normal sinus rhythm.  Patient now presents with her husband present at bedside for urgent visit with complaints of fatigue and " just not feeling like doing anything".  Patient reports fatigue has been worsening over the last 2 weeks and she feels this is related to spironolactone as she feels worse after she takes it.  Patient brings with her written log of home blood pressure readings which are well controlled.  At home she does have episodes of heart rate as low as 46 bpm, however patient has known history of PACs/PVCs, which Will monitoring may not be registering.   She denies chest pain, dyspnea, syncope, near syncope, leg edema.  Denies orthopnea, PND.  She does continue to complain of fatigue.  Past Medical History:  Diagnosis Date   Anxiety    Cancer ([facility])    Melanoma   Cataract    Chest pain    myoview 04/01/12-normal   History of tobacco abuse    HTN (hypertension)    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/06/2017   Past Surgical History:  Procedure Laterality Date   CARDIOVERSION N/A 12/04/2020   Procedure: CARDIOVERSION;  Surgeon: [name], MD;  Location: [facility] ENDOSCOPY;  Service: Cardiovascular;  Laterality: N/A;    CHOLECYSTECTOMY     MELANOMA EXCISION     x5   Family History  Problem Relation Age of Onset   Congestive Heart Failure Mother    Heart failure Mother    Heart attack Father        age 74   Heart Problems Brother    Cancer Brother    Cancer Brother        pancreatic    Cancer Sister        breast    Cancer Sister        breast   Breast cancer Neg Hx     Social History   Tobacco Use   Smoking status: Former    Types: Cigarettes    Quit date: 02/25/1988    Years since quitting: 33.0   Smokeless tobacco: Never  Substance Use Topics   Alcohol use: No   Marital Status: Married   ROS  Review of Systems  Constitutional: Positive for malaise/fatigue. Negative for weight gain.  Cardiovascular:  Negative for chest pain, claudication, leg swelling, near-syncope, orthopnea, palpitations, paroxysmal nocturnal dyspnea and syncope.  Respiratory:  Negative for shortness of breath.    Objective  Blood pressure (!) 146/57, pulse (!) 53, height 5\' 3"  (1.6 m), weight 130 lb (59 kg), SpO2 100 %.  Vitals with BMI 03/19/2021 02/07/2021 01/15/2021  Height 5\' 3"  5\' 3"  5\' 3"   Weight 130 lbs 130 lbs 131 lbs 10 oz  BMI 23.03  22.29 79.89  Systolic 211 941 740  Diastolic 57 62 62  Pulse 53 63 67     Physical Exam Vitals reviewed.  Cardiovascular:     Rate and Rhythm: Normal rate and regular rhythm. Occasional Extrasystoles are present.    Pulses: Intact distal pulses.          Carotid pulses are  on the left side with bruit.    Heart sounds: S1 normal and S2 normal. No murmur heard.   No gallop.  Pulmonary:     Effort: Pulmonary effort is normal. No respiratory distress.     Breath sounds: No wheezing, rhonchi or rales.  Musculoskeletal:     Right lower leg: No edema.     Left lower leg: No edema.  Neurological:     Mental Status: She is alert.   Laboratory examination:   Recent Labs    10/23/20 1519 10/25/20 0257 11/15/20 1721 01/07/21 1007 01/31/21 1317 02/07/21 1211   NA 136 136 134* 134 138 136  K 3.3* 3.9 4.0 4.0 3.8 3.2*  CL 106 104 101 95* 99 90*  CO2 24 26 25 21 25 27   GLUCOSE 162* 139* 186* 187* 133* 133*  BUN 6* 8 13 12 11 17   CREATININE 0.65 0.71 0.85 0.91 0.79 0.94  CALCIUM 8.7* 8.9 9.0 9.4 9.7 9.8  GFRNONAA >60 >60 >60  --   --   --    CrCl cannot be calculated (Patient's most recent lab result is older than the maximum 21 days allowed.).  CMP Latest Ref Rng & Units 02/07/2021 01/31/2021 01/07/2021  Glucose 70 - 99 mg/dL 133(H) 133(H) 187(H)  BUN 8 - 27 mg/dL 17 11 12   Creatinine 0.57 - 1.00 mg/dL 0.94 0.79 0.91  Sodium 134 - 144 mmol/L 136 138 134  Potassium 3.5 - 5.2 mmol/L 3.2(L) 3.8 4.0  Chloride 96 - 106 mmol/L 90(L) 99 95(L)  CO2 20 - 29 mmol/L 27 25 21   Calcium 8.7 - 10.3 mg/dL 9.8 9.7 9.4  Total Protein 6.0 - 8.5 g/dL - - -  Total Bilirubin 0.0 - 1.2 mg/dL - - -  Alkaline Phos 39 - 117 IU/L - - -  AST 0 - 40 IU/L - - -  ALT 0 - 32 IU/L - - -   CBC Latest Ref Rng & Units 02/07/2021 11/15/2020 10/25/2020  WBC 3.4 - 10.8 x10E3/uL 8.3 7.2 4.7  Hemoglobin 11.1 - 15.9 g/dL 12.3 11.4(L) 10.6(L)  Hematocrit 34.0 - 46.6 % 35.3 32.9(L) 30.0(L)  Platelets 150 - 450 x10E3/uL 293 250 293    Lipid Panel No results for input(s): CHOL, TRIG, LDLCALC, VLDL, HDL, CHOLHDL, LDLDIRECT in the last 8760 hours.  HEMOGLOBIN A1C Lab Results  Component Value Date   HGBA1C 5.6 10/23/2020   MPG 114 10/23/2020   TSH Recent Labs    10/23/20 1519  TSH 1.149  External labs:  None   Allergies   Allergies  Allergen Reactions   Amoxicillin Swelling and Other (See Comments)    Tongue swelling  Has patient had a PCN reaction causing immediate rash, facial/tongue/throat swelling, SOB or lightheadedness with hypotension: Yes Has patient had a PCN reaction causing severe rash involving mucus membranes or skin necrosis: Unknown Has patient had a PCN reaction that required hospitalization: Unknown Has patient had a PCN reaction occurring within the  last 10 years: Unknown If all of the above answers are "NO", then may proceed with Cephalosporin use.    Amoxicillin-Pot Clavulanate Swelling and Other (  See Comments)    Swollen and red tongue   Codeine Nausea And Vomiting   Latex Rash and Other (See Comments)    NO BAND-AIDS!!!!   Tape Rash and Other (See Comments)    NO BAND-AIDS OR TAPE!!!!    Medications Prior to Visit:   Outpatient Medications Prior to Visit  Medication Sig Dispense Refill   ALPRAZolam (XANAX) 0.25 MG tablet Take 0.125 mg by mouth 2 (two) times daily as needed for anxiety (palpitations).     amiodarone (PACERONE) 200 MG tablet Take 1 tablet (200 mg total) by mouth daily.     amLODipine (NORVASC) 10 MG tablet Take 1 tablet (10 mg total) by mouth daily. (Patient taking differently: Take 10 mg by mouth at bedtime.) 90 tablet 3   apixaban (ELIQUIS) 2.5 MG TABS tablet Take 1 tablet (2.5 mg total) by mouth 2 (two) times daily. 60 tablet 0   Calcium Carb-Cholecalciferol (CALCIUM 600 + D PO) Take 1 tablet by mouth daily.     Carboxymethylcellulose Sodium 1 % GEL Place 1 application into both eyes at bedtime as needed (dry eyes).     Cholecalciferol (VITAMIN D-3) 25 MCG (1000 UT) CAPS Take 1,000 Units by mouth daily.     Ferrous Sulfate Dried (SLOW RELEASE IRON) 45 MG TBCR Take 45 mg by mouth every other day.     furosemide (LASIX) 40 MG tablet Take 1 tablet (40 mg total) by mouth 2 (two) times daily as needed for fluid or edema. (Patient taking differently: Take 40 mg by mouth 2 (two) times daily.) 60 tablet 3   metoprolol tartrate (LOPRESSOR) 50 MG tablet Take 50 mg by mouth 2 (two) times daily.     Multiple Vitamin (MULTIVITAMIN WITH MINERALS) TABS tablet Take 1 tablet by mouth daily.     Olopatadine HCl (PATADAY OP) Place 1 drop into both eyes daily as needed (allergies).     omeprazole (PRILOSEC) 40 MG capsule Take 40 mg by mouth daily as needed (heartburn).     Polyethyl Glycol-Propyl Glycol (SYSTANE OP) Place 1 drop  into both eyes daily as needed (dry eyes).     Zinc 50 MG CAPS Take 50 mg by mouth daily.     zolpidem (AMBIEN) 10 MG tablet Take 5 mg by mouth at bedtime.     spironolactone (ALDACTONE) 25 MG tablet Take 0.5 tablets (12.5 mg total) by mouth daily. 45 tablet 3   No facility-administered medications prior to visit.   Final Medications at End of Visit    Current Meds  Medication Sig   ALPRAZolam (XANAX) 0.25 MG tablet Take 0.125 mg by mouth 2 (two) times daily as needed for anxiety (palpitations).   amiodarone (PACERONE) 200 MG tablet Take 1 tablet (200 mg total) by mouth daily.   amLODipine (NORVASC) 10 MG tablet Take 1 tablet (10 mg total) by mouth daily. (Patient taking differently: Take 10 mg by mouth at bedtime.)   apixaban (ELIQUIS) 2.5 MG TABS tablet Take 1 tablet (2.5 mg total) by mouth 2 (two) times daily.   Calcium Carb-Cholecalciferol (CALCIUM 600 + D PO) Take 1 tablet by mouth daily.   Carboxymethylcellulose Sodium 1 % GEL Place 1 application into both eyes at bedtime as needed (dry eyes).   Cholecalciferol (VITAMIN D-3) 25 MCG (1000 UT) CAPS Take 1,000 Units by mouth daily.   Ferrous Sulfate Dried (SLOW RELEASE IRON) 45 MG TBCR Take 45 mg by mouth every other day.   furosemide (LASIX) 40 MG tablet Take 1 tablet (  40 mg total) by mouth 2 (two) times daily as needed for fluid or edema. (Patient taking differently: Take 40 mg by mouth 2 (two) times daily.)   metoprolol tartrate (LOPRESSOR) 50 MG tablet Take 50 mg by mouth 2 (two) times daily.   Multiple Vitamin (MULTIVITAMIN WITH MINERALS) TABS tablet Take 1 tablet by mouth daily.   Olopatadine HCl (PATADAY OP) Place 1 drop into both eyes daily as needed (allergies).   omeprazole (PRILOSEC) 40 MG capsule Take 40 mg by mouth daily as needed (heartburn).   Polyethyl Glycol-Propyl Glycol (SYSTANE OP) Place 1 drop into both eyes daily as needed (dry eyes).   Zinc 50 MG CAPS Take 50 mg by mouth daily.   zolpidem (AMBIEN) 10 MG tablet Take  5 mg by mouth at bedtime.   [DISCONTINUED] spironolactone (ALDACTONE) 25 MG tablet Take 0.5 tablets (12.5 mg total) by mouth daily.   Radiology:   No results found.  Cardiac Studies:   Echocardiogram 10/23/2020:   1. Left ventricular ejection fraction, by estimation, is 60 to 65%. The  left ventricle has normal function. The left ventricle has no regional  wall motion abnormalities. Left ventricular diastolic function could not  be evaluated.   2. Right ventricular systolic function is normal. The right ventricular  size is normal. There is normal pulmonary artery systolic pressure.   3. Left atrial size was mildly dilated.   4. The mitral valve is normal in structure. Trivial mitral valve  regurgitation.   5. Tricuspid valve regurgitation is moderate.   6. The aortic valve is tricuspid. There is mild calcification of the  aortic valve. There is mild thickening of the aortic valve. Aortic valve  regurgitation is not visualized. Mild to moderate aortic valve  sclerosis/calcification is present, without any  evidence of aortic stenosis.   7. The inferior vena cava is dilated in size with >50% respiratory  variability, suggesting right atrial pressure of 8 mmHg.   Carotid artery duplex 11/29/2020:  No evidence of significant stenosis in the right carotid vessels.  Duplex suggests stenosis in the left internal carotid artery (16-49%).  Duplex suggests stenosis in the left external carotid artery (<50%).  Antegrade right vertebral artery flow. Antegrade left vertebral artery flow.  Follow up in one year is appropriate if clinically indicated.v  Direct current cardioversion 12/04/2020 9:51 AM Indication symptomatic A. Fibrillation. Procedure: Using 50 mg of IV Propofol and 60 IV Lidocaine (for reducing venous pain) for achieving deep sedation, synchronized direct current cardioversion performed. Patient was delivered with 120 Joules of electricity X 1 with success to NSR. Patient  tolerated the procedure well. No immediate complication noted.   PCV MYOCARDIAL PERFUSION WITH LEXISCAN 01/09/2021 Nondiagnostic ECG stress. Resting AF with RVR, no change with Lexiscan infusion. Myocardial perfusion is normal. Overall LV systolic function is normal without regional wall motion abnormalities. Stress LV EF: 42% although visually the LVEF is normal in size and function. No previous exam available for comparison. Low risk study.  EKG:  03/19/2021: Sinus rhythm at a rate of 53 bpm with a single PAC.  Normal axis.  Incomplete right bundle branch block.  Low voltage complexes, consider pulmonary disease pattern.  02/07/2021: Atrial fibrillation with controlled ventricular response at a rate of 76 bpm.  01/09/2021: Atrial fibrillation with ventricular rate of 111 bpm.  12/18/2020: Sinus bradycardia at a rate of 55 bpm with single PAC.  EKG 11/22/2020: Atrial fibrillation with controlled ventricular sponsor rate of 94 bpm.  Normal axis.  No evidence  of ischemia or underlying injury pattern.  Assessment     ICD-10-CM   1. Dyspnea on exertion  R06.09 EKG 83-[facility]    Basic metabolic panel    Brain natriuretic peptide    2. Other fatigue  R53.83 EKG 22-[facility]    Basic metabolic panel    Brain natriuretic peptide    3. Essential hypertension  I10     4. Paroxysmal A-fib (HCC)  I48.0 EKG 12-Lead        Medications Discontinued During This Encounter  Medication Reason   spironolactone (ALDACTONE) 25 MG tablet Discontinued by provider     No orders of the defined types were placed in this encounter.  This patients CHA2DS2-VASc Score 4 (HTN, A, F) and yearly risk of stroke 4.8%.   Recommendations:   KAYTLAN BEHRMAN is a 88 y.o. Caucasian female with history of hypertension, chronic iron deficiency anemia, anxiety, and GERD, as well as history of tobacco use (quit in 2000) and family history of premature CAD.  She also has history of COVID-19 infection in early 09/2020.   Diagnosed with A. fib 09/2020.  Patient was started on Eliquis 10/23/2020.  Due to recurrence of atrial fibrillation and recurrent heart failure/volume overload shared decision was to proceed with cardioversion.  However when patient presented for cardioversion on 03/05/2021 she had spontaneously converted to normal sinus rhythm.  Patient now presents with her husband present at bedside for urgent visit with complaints of fatigue and " just not feeling like doing anything".  Suspect patient's symptoms are multifactorial included age-related deconditioning.  Given that patient has PAC noted on EKG and history of atrial fibrillation we will hold off on reducing metoprolol, although could consider this in the future as she did have bradycardia when she presented to hospital for cardioversion.  However discussed with patient regarding holding spironolactone versus reducing metoprolol, and shared decision was to hold spironolactone for 1 week and reevaluate patient's symptoms.  If she continues to experience symptoms would recommend reducing metoprolol to 25 mg twice daily.  Patient will monitor blood pressure closely.  Given patient's advanced age would allow for blood pressure to run on the higher side if patient's symptoms improve.  Again reviewed with patient results of echocardiogram and stress test which were overall reassuring from a cardiovascular standpoint.  Patient is scheduled for appointment with PCP in 2 days.  Will request that PCP draw BMP and BNP to further evaluate patient's symptoms.  Would appreciate PCP recommendations in regard to fatigue as well.  Follow-up in 4 weeks, sooner if needed.   Kristi Berthold, PA-C 03/19/2021, 2:22 PM Office: [phone]

## 2021-03-19 NOTE — Telephone Encounter (Signed)
Pt called and stated that she has been feeling more weak and fatigued than usual the last 2 days. She stated it got worse yesterday morning when she took her husband to an eye doctor appt. She said her legs felt like they were going to give out. She has been taking her medication as directed. Her BP this morning 128/64 HR 45. She said her pulse rate has been running low for a little while now. She would like to know if she should come off of some BP meds. Please advise.

## 2021-03-19 NOTE — Telephone Encounter (Signed)
I see she is scheduled for appt Friday, if she would like to move that up I can see her at 1:30 or 2 today.

## 2021-03-22 ENCOUNTER — Ambulatory Visit: Payer: Medicare Other | Admitting: Student

## 2021-03-28 ENCOUNTER — Telehealth: Payer: Self-pay

## 2021-03-28 NOTE — Telephone Encounter (Signed)
-----   Message from Alethia Berthold, [place] sent at 03/26/2021  4:08 PM EST ----- Please call patient and see if fatigue has improved since holding spironolactone. Please let me know. Also get BP readings for the last 3 days.

## 2021-03-29 ENCOUNTER — Telehealth: Payer: Self-pay

## 2021-03-29 NOTE — Telephone Encounter (Signed)
Bp readings 139/55Hr 78 150/66 hr 56 135/71 hr 57 Hasn't noticed difference fatigue

## 2021-04-17 ENCOUNTER — Ambulatory Visit: Payer: Medicare Other | Admitting: Student

## 2021-04-17 ENCOUNTER — Encounter: Payer: Self-pay | Admitting: Student

## 2021-04-17 ENCOUNTER — Other Ambulatory Visit: Payer: Self-pay

## 2021-04-17 VITALS — BP 118/60 | HR 70 | Temp 98.3°F | Resp 17 | Ht 63.0 in | Wt 129.2 lb

## 2021-04-17 DIAGNOSIS — I480 Paroxysmal atrial fibrillation: Secondary | ICD-10-CM

## 2021-04-17 DIAGNOSIS — R5383 Other fatigue: Secondary | ICD-10-CM

## 2021-04-17 DIAGNOSIS — R531 Weakness: Secondary | ICD-10-CM

## 2021-04-17 NOTE — Progress Notes (Signed)
Primary Physician/Referring:  Shon Baton, MD  Patient ID: Kristi Montgomery, female    DOB: [birth year], 88 y.o.   MRN: [id number]  Chief Complaint  Patient presents with   Follow-up    4 WEEKS   Fatigue   Bradycardia   Dizziness    HPI:    Kristi Montgomery  is a 88 y.o. Caucasian female with history of hypertension, chronic iron deficiency anemia, anxiety, and GERD, as well as history of tobacco use (quit in 2000) and family history of premature CAD.  She also has history of COVID-19 infection in early 09/2020.  Diagnosed with A. fib 09/2020.  Patient was started on Eliquis 10/23/2020.  Due to recurrence of atrial fibrillation and recurrent heart failure/volume overload shared decision was to proceed with cardioversion.  However when patient presented for cardioversion on 03/05/2021 she had spontaneously converted to normal sinus rhythm.  Patient presents for 4-week follow-up.  At last office visit given concerns of fatigue patient was advised to hold spironolactone for 1 week and reevaluate symptoms.  Patient reported unchanged fatigue symptoms with holding spironolactone, therefore she was advised to restart spironolactone and hold metoprolol in order to reevaluate symptoms. Patient held metoprolol for 1 week and noticed no difference in fatigue. However over the last 2 weeks patient reports fatigue has improved. She was also seen by PCP since last visit and workup was unremarkable per patient report.    She denies chest pain, dyspnea, syncope, near syncope, leg edema.  Denies orthopnea, PND.   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   Past Surgical History:  Procedure Laterality Date   CARDIOVERSION N/A 12/04/2020   Procedure: CARDIOVERSION;  Surgeon: [name], MD;  Location: [facility] ENDOSCOPY;  Service: Cardiovascular;   Laterality: N/A;   CHOLECYSTECTOMY     MELANOMA EXCISION     x5   Family History  Problem Relation Age of Onset   Congestive Heart Failure Mother    Heart failure Mother    Heart attack Father        age 42   Heart Problems Brother    Cancer Brother    Cancer Brother        pancreatic    Cancer Sister        breast    Cancer Sister        breast   Breast cancer Neg Hx     Social History   Tobacco Use   Smoking status: Former    Types: Cigarettes    Quit date: 02/25/1988    Years since quitting: 33.1   Smokeless tobacco: Never  Substance Use Topics   Alcohol use: No   Marital Status: Married   ROS  Review of Systems  Constitutional: Positive for malaise/fatigue. Negative for weight gain.  Cardiovascular:  Negative for chest pain, claudication, leg swelling, near-syncope, orthopnea, palpitations, paroxysmal nocturnal dyspnea and syncope.  Respiratory:  Negative for shortness of breath.    Objective  Blood pressure 118/60, pulse 70, temperature 98.3 F (36.8 C), temperature source Temporal, resp. rate 17, height 5\' 3"  (1.6 m), weight 129 lb 3.2 oz (58.6 kg), SpO2 99 %.  Vitals with BMI 04/17/2021 03/19/2021 02/07/2021  Height 5\' 3"  5\' 3"  5\' 3"   Weight 129 lbs 3  oz 130 lbs 130 lbs  BMI 22.89 29.93 71.69  Systolic 678 938 101  Diastolic 60 57 62  Pulse 70 53 63     Physical Exam Vitals reviewed.  Cardiovascular:     Rate and Rhythm: Normal rate and regular rhythm. Occasional Extrasystoles are present.    Pulses: Intact distal pulses.          Carotid pulses are  on the left side with bruit.    Heart sounds: S1 normal and S2 normal. No murmur heard.   No gallop.  Pulmonary:     Effort: Pulmonary effort is normal. No respiratory distress.     Breath sounds: No wheezing, rhonchi or rales.  Musculoskeletal:     Right lower leg: No edema.     Left lower leg: No edema.  Neurological:     Mental Status: She is alert.  Physical exam unchanged compared to previous  office visit.  Laboratory examination:   Recent Labs    10/23/20 1519 10/25/20 0257 11/15/20 1721 01/07/21 1007 01/31/21 1317 02/07/21 1211  NA 136 136 134* 134 138 136  K 3.3* 3.9 4.0 4.0 3.8 3.2*  CL 106 104 101 95* 99 90*  CO2 24 26 25 21 25 27   GLUCOSE 162* 139* 186* 187* 133* 133*  BUN 6* 8 13 12 11 17   CREATININE 0.65 0.71 0.85 0.91 0.79 0.94  CALCIUM 8.7* 8.9 9.0 9.4 9.7 9.8  GFRNONAA >60 >60 >60  --   --   --    CrCl cannot be calculated (Patient's most recent lab result is older than the maximum 21 days allowed.).  CMP Latest Ref Rng & Units 02/07/2021 01/31/2021 01/07/2021  Glucose 70 - 99 mg/dL 133(H) 133(H) 187(H)  BUN 8 - 27 mg/dL 17 11 12   Creatinine 0.57 - 1.00 mg/dL 0.94 0.79 0.91  Sodium 134 - 144 mmol/L 136 138 134  Potassium 3.5 - 5.2 mmol/L 3.2(L) 3.8 4.0  Chloride 96 - 106 mmol/L 90(L) 99 95(L)  CO2 20 - 29 mmol/L 27 25 21   Calcium 8.7 - 10.3 mg/dL 9.8 9.7 9.4  Total Protein 6.0 - 8.5 g/dL - - -  Total Bilirubin 0.0 - 1.2 mg/dL - - -  Alkaline Phos 39 - 117 IU/L - - -  AST 0 - 40 IU/L - - -  ALT 0 - 32 IU/L - - -   CBC Latest Ref Rng & Units 02/07/2021 11/15/2020 10/25/2020  WBC 3.4 - 10.8 x10E3/uL 8.3 7.2 4.7  Hemoglobin 11.1 - 15.9 g/dL 12.3 11.4(L) 10.6(L)  Hematocrit 34.0 - 46.6 % 35.3 32.9(L) 30.0(L)  Platelets 150 - 450 x10E3/uL 293 250 293    Lipid Panel No results for input(s): CHOL, TRIG, LDLCALC, VLDL, HDL, CHOLHDL, LDLDIRECT in the last 8760 hours.  HEMOGLOBIN A1C Lab Results  Component Value Date   HGBA1C 5.6 10/23/2020   MPG 114 10/23/2020   TSH Recent Labs    10/23/20 1519  TSH 1.149  External labs:  None   Allergies   Allergies  Allergen Reactions   Amoxicillin Swelling and Other (See Comments)    Tongue swelling  Has patient had a PCN reaction causing immediate rash, facial/tongue/throat swelling, SOB or lightheadedness with hypotension: Yes Has patient had a PCN reaction causing severe rash involving mucus  membranes or skin necrosis: Unknown Has patient had a PCN reaction that required hospitalization: Unknown Has patient had a PCN reaction occurring within the last 10 years: Unknown If all of the above answers  are "NO", then may proceed with Cephalosporin use.    Amoxicillin-Pot Clavulanate Swelling and Other (See Comments)    Swollen and red tongue   Codeine Nausea And Vomiting   Latex Rash and Other (See Comments)    NO BAND-AIDS!!!!   Tape Rash and Other (See Comments)    NO BAND-AIDS OR TAPE!!!!    Medications Prior to Visit:   Outpatient Medications Prior to Visit  Medication Sig Dispense Refill   ALPRAZolam (XANAX) 0.25 MG tablet Take 0.125 mg by mouth 2 (two) times daily as needed for anxiety (palpitations).     amiodarone (PACERONE) 200 MG tablet Take 1 tablet (200 mg total) by mouth daily.     amLODipine (NORVASC) 10 MG tablet Take 1 tablet (10 mg total) by mouth daily. (Patient taking differently: Take 10 mg by mouth at bedtime.) 90 tablet 3   apixaban (ELIQUIS) 2.5 MG TABS tablet Take 1 tablet (2.5 mg total) by mouth 2 (two) times daily. 60 tablet 0   Calcium Carb-Cholecalciferol (CALCIUM 600 + D PO) Take 1 tablet by mouth daily.     Carboxymethylcellulose Sodium 1 % GEL Place 1 application into both eyes at bedtime as needed (dry eyes).     Cholecalciferol (VITAMIN D-3) 25 MCG (1000 UT) CAPS Take 1,000 Units by mouth daily.     Ferrous Sulfate Dried (SLOW RELEASE IRON) 45 MG TBCR Take 45 mg by mouth every other day.     furosemide (LASIX) 40 MG tablet Take 1 tablet (40 mg total) by mouth 2 (two) times daily as needed for fluid or edema. (Patient taking differently: Take 40 mg by mouth 2 (two) times daily.) 60 tablet 3   metoprolol tartrate (LOPRESSOR) 50 MG tablet Take 25 mg by mouth 2 (two) times daily.     Multiple Vitamin (MULTIVITAMIN WITH MINERALS) TABS tablet Take 1 tablet by mouth daily.     Olopatadine HCl (PATADAY OP) Place 1 drop into both eyes daily as needed  (allergies).     omeprazole (PRILOSEC) 40 MG capsule Take 40 mg by mouth daily as needed (heartburn).     Polyethyl Glycol-Propyl Glycol (SYSTANE OP) Place 1 drop into both eyes daily as needed (dry eyes).     spironolactone (ALDACTONE) 25 MG tablet Take 12.5 mg by mouth daily.     Zinc 50 MG CAPS Take 50 mg by mouth daily.     zolpidem (AMBIEN) 10 MG tablet Take 5 mg by mouth at bedtime.     No facility-administered medications prior to visit.   Final Medications at End of Visit    Current Meds  Medication Sig   ALPRAZolam (XANAX) 0.25 MG tablet Take 0.125 mg by mouth 2 (two) times daily as needed for anxiety (palpitations).   amiodarone (PACERONE) 200 MG tablet Take 1 tablet (200 mg total) by mouth daily.   amLODipine (NORVASC) 10 MG tablet Take 1 tablet (10 mg total) by mouth daily. (Patient taking differently: Take 10 mg by mouth at bedtime.)   apixaban (ELIQUIS) 2.5 MG TABS tablet Take 1 tablet (2.5 mg total) by mouth 2 (two) times daily.   Calcium Carb-Cholecalciferol (CALCIUM 600 + D PO) Take 1 tablet by mouth daily.   Carboxymethylcellulose Sodium 1 % GEL Place 1 application into both eyes at bedtime as needed (dry eyes).   Cholecalciferol (VITAMIN D-3) 25 MCG (1000 UT) CAPS Take 1,000 Units by mouth daily.   Ferrous Sulfate Dried (SLOW RELEASE IRON) 45 MG TBCR Take 45 mg by mouth every  other day.   furosemide (LASIX) 40 MG tablet Take 1 tablet (40 mg total) by mouth 2 (two) times daily as needed for fluid or edema. (Patient taking differently: Take 40 mg by mouth 2 (two) times daily.)   metoprolol tartrate (LOPRESSOR) 50 MG tablet Take 25 mg by mouth 2 (two) times daily.   Multiple Vitamin (MULTIVITAMIN WITH MINERALS) TABS tablet Take 1 tablet by mouth daily.   Olopatadine HCl (PATADAY OP) Place 1 drop into both eyes daily as needed (allergies).   omeprazole (PRILOSEC) 40 MG capsule Take 40 mg by mouth daily as needed (heartburn).   Polyethyl Glycol-Propyl Glycol (SYSTANE OP) Place  1 drop into both eyes daily as needed (dry eyes).   spironolactone (ALDACTONE) 25 MG tablet Take 12.5 mg by mouth daily.   Zinc 50 MG CAPS Take 50 mg by mouth daily.   zolpidem (AMBIEN) 10 MG tablet Take 5 mg by mouth at bedtime.   Radiology:   No results found.  Cardiac Studies:   Carotid artery duplex 12/06/20:  No evidence of significant stenosis in the right carotid vessels.  Duplex suggests stenosis in the left internal carotid artery (16-49%).  Duplex suggests stenosis in the left external carotid artery (<50%).  Antegrade right vertebral artery flow. Antegrade left vertebral artery flow.  Follow up in one year is appropriate if clinically indicated.v  Direct current cardioversion 12/04/2020 9:51 AM Indication symptomatic A. Fibrillation. Procedure: Using 50 mg of IV Propofol and 60 IV Lidocaine (for reducing venous pain) for achieving deep sedation, synchronized direct current cardioversion performed. Patient was delivered with 120 Joules of electricity X 1 with success to NSR. Patient tolerated the procedure well. No immediate complication noted.   PCV MYOCARDIAL PERFUSION WITH LEXISCAN 01/09/2021 Nondiagnostic ECG stress. Resting AF with RVR, no change with Lexiscan infusion. Myocardial perfusion is normal. Overall LV systolic function is normal without regional wall motion abnormalities. Stress LV EF: 42% although visually the LVEF is normal in size and function. No previous exam available for comparison. Low risk study.  Echocardiogram 02/28/2021:  Normal LV systolic function with visual EF 60-65%. Left ventricle cavity  is normal in size. Normal left ventricular wall thickness. Normal global  wall motion. Doppler evidence of grade II (pseudonormal) diastolic  dysfunction, elevated LAP.  Moderate tricuspid regurgitation. Mild to moderate pulmonary hypertension.  RVSP measures 44 mmHg.  Compared to study 10/23/2020 Grade II DD and elevated LAP are new  findings.    EKG:  04/17/2021: Sinus bradycardia at a rate of 50 bpm.  Normal axis.  Incomplete right bundle branch block.  Low voltage complexes, consider pulmonary disease pattern. Compared to EKG 03/19/2021 no significant change.   02/07/2021: Atrial fibrillation with controlled ventricular response at a rate of 76 bpm.  01/09/2021: Atrial fibrillation with ventricular rate of 111 bpm.  12/18/2020: Sinus bradycardia at a rate of 55 bpm with single PAC.  EKG 11/22/2020: Atrial fibrillation with controlled ventricular sponsor rate of 94 bpm.  Normal axis.  No evidence of ischemia or underlying injury pattern.  Assessment     ICD-10-CM   1. Other fatigue  R53.83 Ambulatory referral to Home Health    2. Paroxysmal A-fib (HCC)  I48.0 EKG 12-Lead    3. Generalized weakness  R53.1 Ambulatory referral to Home Health     There are no discontinued medications.   No orders of the defined types were placed in this encounter.  This patients CHA2DS2-VASc Score 4 (HTN, A, F) and yearly risk of stroke 4.8%.  Recommendations:   Kristi Montgomery is a 88 y.o. Caucasian female with history of hypertension, chronic iron deficiency anemia, anxiety, and GERD, as well as history of tobacco use (quit in 2000) and family history of premature CAD.  She also has history of COVID-19 infection in early 09/2020.  Diagnosed with A. fib 09/2020.  Patient was started on Eliquis 10/23/2020.  Due to recurrence of atrial fibrillation and recurrent heart failure/volume overload shared decision was to proceed with cardioversion.  However when patient presented for cardioversion on 03/05/2021 she had spontaneously converted to normal sinus rhythm.  Patient presents for 4-week follow-up.  At last office visit given concerns of fatigue patient was advised to hold spironolactone for 1 week and reevaluate symptoms.  Patient reported unchanged fatigue symptoms with holding spironolactone, therefore she was advised to restart  spironolactone and hold metoprolol in order to reevaluate symptoms. Holding metoprolol did not affect patient's fatigue either. Do not suspect fatigue is related to cardiac medications. Again reviewed echo and stress test which does shoe G2DD which may contribute to patient's dyspnea however suspect fatigue if primarily to to deconditioning as it is improving with increased physical activity.   No changes today, but will refer patient for physical therapy to improve strength and endurance.   Follow up in 6 months, sooner if needed.    Kristi Berthold, PA-C 04/17/2021, 12:53 PM Office: [phone]

## 2021-04-22 ENCOUNTER — Telehealth: Payer: Self-pay

## 2021-04-22 NOTE — Telephone Encounter (Signed)
Well Kristi Montgomery is calling to let us know that her care be delayed until 05/08/2021 because she is going out of town.

## 2021-05-13 ENCOUNTER — Other Ambulatory Visit: Payer: Self-pay

## 2021-05-13 MED ORDER — SPIRONOLACTONE 25 MG PO TABS: 12.5000 mg | ORAL_TABLET | Freq: Every day | ORAL | 0 refills | Status: DC

## 2021-05-23 ENCOUNTER — Telehealth: Payer: Self-pay

## 2021-05-24 NOTE — Telephone Encounter (Signed)
Called pt to inform her about the message above

## 2021-05-24 NOTE — Telephone Encounter (Signed)
Lasix 40 mg once daily with additional doses as needed

## 2021-06-07 ENCOUNTER — Ambulatory Visit
Admission: EM | Admit: 2021-06-07 | Discharge: 2021-06-07 | Disposition: A | Discharge status: Home / Self Care | Payer: Medicare Other | Attending: Family Medicine | Admitting: Family Medicine

## 2021-06-07 ENCOUNTER — Ambulatory Visit (INDEPENDENT_AMBULATORY_CARE_PROVIDER_SITE_OTHER): Payer: Medicare Other

## 2021-06-07 DIAGNOSIS — M79672 Pain in left foot: Secondary | ICD-10-CM | POA: Diagnosis not present

## 2021-06-07 NOTE — ED Triage Notes (Signed)
Pt c/o dropping a squirt bottle onto her left shin "several days ago" the bottle rebounded onto the top of her left foot as well. States her foot was bruised and swollen but recently has become bothersome to walk.  ?

## 2021-06-07 NOTE — Discharge Instructions (Addendum)
Your x-ray did not show any fracture ? ?Continue to ice and elevate your foot.  I would minimize any walking or activities for a day or 2. ? ?You can take Tylenol extra strength--2 every 4-6 hours as needed for pain ? ?Skip 1 dose of your Eliquis tonight. ?

## 2021-06-07 NOTE — ED Provider Notes (Signed)
[facility]    CSN: [id number] Arrival date & time: 06/07/21  1827      History   Chief Complaint Chief Complaint  Patient presents with   left foot edema    HPI Kristi Montgomery is a 88 y.o. female.   HPI Here with pain and swelling in her left foot.  At least 2 days ago, may be more, she states she dropped a metal spray can on her left lower shin and then on her left forefoot.  She states that it got more swollen today and has been more painful and harder to walk on.  No fever  She does have A-fib and is on Eliquis.  She has been icing it today and he thinks is maybe a tad better than it was earlier  Past Medical History:  Diagnosis Date   Anxiety    Cancer ([facility])    Melanoma   Cataract    Chest pain    myoview 04/01/12-normal   History of tobacco abuse    HTN (hypertension)    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/06/2017    Patient Active Problem List   Diagnosis Date Noted   Paroxysmal A-fib ([facility]CC) 01/13/2021   Atrial fibrillation with rapid ventricular response (HCC) 10/23/2020   Persistent atrial fibrillation ([facility]CC) 10/22/2020   Bilateral temporomandibular joint pain 09/24/2017   Presbycusis of both ears 09/24/2017   Temporal arteritis ([facility]) 09/24/2017   Blepharospasm 09/15/2017   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/06/2017   Bilateral dry eyes 07/13/2014   Essential hypertension 07/29/2012   Choroidal nevus 07/10/2011   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   Nuclear cataract 07/10/2011   CHEST PAIN UNSPECIFIED 01/25/2009   DYSPHAGIA UNSPECIFIED 01/25/2009    Past Surgical History:  Procedure Laterality Date   CARDIOVERSION N/A 12/04/2020   Procedure: CARDIOVERSION;  Surgeon: Yates Decamp, MD;  Location: [facility] ENDOSCOPY;  Service: Cardiovascular;  Laterality: N/A;   CHOLECYSTECTOMY     MELANOMA EXCISION     x5    OB History     Gravida      Para      Term      Preterm       AB      Living  2      SAB      IAB      Ectopic      Multiple      Live Births               Home Medications    Prior to Admission medications   Medication Sig Start Date End Date Taking? Authorizing Provider  ALPRAZolam (XANAX) 0.25 MG tablet Take 0.125 mg by mouth 2 (two) times daily as needed for anxiety (palpitations). 12/09/17   [provider]  amiodarone (PACERONE) 200 MG tablet Take 1 tablet (200 mg total) by mouth daily. 01/15/21   Cantwell, Celeste C, PA-C  amLODipine (NORVASC) 10 MG tablet Take 1 tablet (10 mg total) by mouth daily. Patient taking differently: Take 10 mg by mouth at bedtime. 12/18/20 12/13/21  Cantwell, Celeste C, PA-C  apixaban (ELIQUIS) 2.5 MG TABS tablet Take 1 tablet (2.5 mg total) by mouth 2 (two) times daily. 10/25/20   Zannie Cove, MD  Calcium Carb-Cholecalciferol (CALCIUM 600 + D PO) Take 1 tablet by mouth daily.    [provider]  Carboxymethylcellulose Sodium 1 % GEL Place 1 application into both eyes at bedtime as needed (dry eyes).  [provider]  Cholecalciferol (VITAMIN D-3) 25 MCG (1000 UT) CAPS Take 1,000 Units by mouth daily.    [provider]  Ferrous Sulfate Dried (SLOW RELEASE IRON) 45 MG TBCR Take 45 mg by mouth every other day.    [provider]  furosemide (LASIX) 40 MG tablet Take 1 tablet (40 mg total) by mouth 2 (two) times daily as needed for fluid or edema. Patient taking differently: Take 40 mg by mouth 2 (two) times daily. 02/07/21 06/07/21  Cantwell, Celeste C, PA-C  metoprolol tartrate (LOPRESSOR) 50 MG tablet Take 25 mg by mouth 2 (two) times daily. 11/15/20   [provider]  Multiple Vitamin (MULTIVITAMIN WITH MINERALS) TABS tablet Take 1 tablet by mouth daily.    [provider]  Olopatadine HCl (PATADAY OP) Place 1 drop into both eyes daily as needed (allergies).    [provider]  omeprazole (PRILOSEC) 40 MG capsule Take 40 mg  by mouth daily as needed (heartburn).    [provider]  Polyethyl Glycol-Propyl Glycol (SYSTANE OP) Place 1 drop into both eyes daily as needed (dry eyes).    [provider]  spironolactone (ALDACTONE) 25 MG tablet Take 0.5 tablets (12.5 mg total) by mouth daily. 05/13/21   Cantwell, Celeste C, PA-C  Zinc 50 MG CAPS Take 50 mg by mouth daily.    [provider]  zolpidem (AMBIEN) 10 MG tablet Take 5 mg by mouth at bedtime. 09/18/20   [provider]    Family History Family History  Problem Relation Age of Onset   Congestive Heart Failure Mother    Heart failure Mother    Heart attack Father        age 74   Heart Problems Brother    Cancer Brother    Cancer Brother        pancreatic    Cancer Sister        breast    Cancer Sister        breast   Breast cancer Neg Hx     Social History Social History   Tobacco Use   Smoking status: Former    Types: Cigarettes    Quit date: 02/25/1988    Years since quitting: 33.3   Smokeless tobacco: Never  Vaping Use   Vaping Use: Never used  Substance Use Topics   Alcohol use: No   Drug use: No     Allergies   Amoxicillin, Amoxicillin-pot clavulanate, Codeine, Latex, and Tape   Review of Systems Review of Systems   Physical Exam Triage Vital Signs ED Triage Vitals  Enc Vitals Group     BP 06/07/21 1834 (!) 152/67     Pulse Rate 06/07/21 1834 62     Resp 06/07/21 1834 18     Temp 06/07/21 1834 98.1 F (36.7 C)     Temp Source 06/07/21 1834 Oral     SpO2 06/07/21 1834 98 %     Weight --      Height --      Head Circumference --      Peak Flow --      Pain Score 06/07/21 1835 0     Pain Loc --      Pain Edu? --      Excl. in GC? --    No data found.  Updated Vital Signs BP (!) 152/67 (BP Location: Left Arm)   Pulse 62   Temp 98.1 F (36.7 C) (Oral)  Resp 18   SpO2 98%   Visual Acuity Right Eye Distance:   Left Eye Distance:   Bilateral Distance:    Right Eye Near:    Left Eye Near:    Bilateral Near:     Physical Exam Vitals reviewed.  Constitutional:      General: She is not in acute distress.    Appearance: She is not toxic-appearing.  Musculoskeletal:     Comments: There is a small ecchymosis on the lower left shin about 3 cm in diameter.  There is ecchymosis on the entire dorsum of the left foot with some mild swelling anteriorly.  No erythema or warmth  Skin:    Coloration: Skin is not jaundiced or pale.  Neurological:     Mental Status: She is oriented to person, place, and time.  Psychiatric:        Behavior: Behavior normal.     UC Treatments / Results  Labs (all labs ordered are listed, but only abnormal results are displayed) Labs Reviewed - No data to display  EKG   Radiology DG Foot Complete Left  Result Date: 06/07/2021 CLINICAL DATA:  Left foot pain and swelling. Dropped can on foot 2 or more days ago. EXAM: LEFT FOOT - COMPLETE 3+ VIEW COMPARISON:  None. FINDINGS: There is no evidence of fracture or dislocation. Mild hallux valgus with minimal degenerative change of the first metatarsal phalangeal joint. No erosive change or bony destruction. Dorsal soft tissue edema. IMPRESSION: Dorsal soft tissue edema. No acute fracture or subluxation. Electronically Signed   By: Narda Rutherford M.D.   On: 06/07/2021 18:58    Procedures Procedures (including critical care time)  Medications Ordered in UC Medications - No data to display  Initial Impression / Assessment and Plan / UC Course  I have reviewed the triage vital signs and the nursing notes.  Pertinent labs & imaging results that were available during my care of the patient were reviewed by me and considered in my medical decision making (see chart for details).     No fracture on the x-ray.  I will have her skip 1 dose of her Eliquis, and ice and elevate it more in the next 48 hours Final Clinical Impressions(s) / UC Diagnoses   Final diagnoses:  Left foot pain      Discharge Instructions      Your x-ray did not show any fracture  Continue to ice and elevate your foot.  I would minimize any walking or activities for a day or 2.  You can take Tylenol extra strength--2 every 4-6 hours as needed for pain  Skip 1 dose of your Eliquis tonight.     ED Prescriptions   None    PDMP not reviewed this encounter.   Zenia Resides, MD 06/07/21 [phone]

## 2021-08-13 ENCOUNTER — Telehealth: Payer: Self-pay

## 2021-08-13 NOTE — Telephone Encounter (Signed)
Spoke to patient, she states fatigue and dizziness is longstanding. Reports most recent home BP just before call 158/71 mmHg and heart rate 56 bpm.  JH, please call patient and add her to scheduled tomorrow or Thursday this week.

## 2021-08-13 NOTE — Telephone Encounter (Signed)
Patient husband called and stated that patient's HR 47 and has been feeling weak and dizzy. Patient denies chest pain and SOB.Patient complains of diarrhea. Patient thinks it may be from a medication she currently takes "spironolactone 1/2 tab or metoprolol" Symptoms started this morning and is asking if she needs to make an appointment and how soon, or does she need to go to ER. Please advise.   MA's call : [phone]

## 2021-08-14 NOTE — Progress Notes (Signed)
Primary Physician/Referring:  Creola Corn, MD  Patient ID: Kristi Montgomery, female    DOB: 12-24-1930, 88 y.o.   MRN: [id number]  Chief Complaint  Patient presents with   Fatigue   Dizziness   Follow-up    HPI:    Kristi Montgomery  is a 88 y.o. Caucasian female with history of hypertension, chronic iron deficiency anemia, anxiety, and GERD, as well as history of tobacco use (quit in 2000) and family history of premature CAD.  She also has history of COVID-19 infection in early 09/2020.  Diagnosed with A. fib 09/2020.  Patient was started on Eliquis 10/23/2020.  Due to recurrence of atrial fibrillation and recurrent heart failure/volume overload shared decision was to proceed with cardioversion.  However when patient presented for cardioversion on 03/05/2021 she had spontaneously converted to normal sinus rhythm.  Patient was last seen 04/17/2021 at which time had independently held both spironolactone and metoprolol in order to determine if these were contributing to patient's fatigue, however fatigue did not improve holding these medications, therefore she was advised to restart them.  She was otherwise stable from a cardiovascular standpoint and referred to physical therapy to improve conditioning.  She now presents for urgent visit at her request with complaints of worsening fatigue and dizziness over the last 3 to 5 days.  She denies chest pain, significant dyspnea, syncope, near syncope.  She continues to tolerate anticoagulation without bleeding diathesis.  Past Medical History:  Diagnosis Date   Anxiety    Cancer (HCC)    Melanoma   Cataract    Chest pain    myoview 04/01/12-normal   History of tobacco abuse    HTN (hypertension)    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/06/2017   Past Surgical History:  Procedure Laterality Date   CARDIOVERSION N/A 12/04/2020   Procedure: CARDIOVERSION;  Surgeon: Yates Decamp, MD;  Location: [facility] ENDOSCOPY;  Service: Cardiovascular;  Laterality: N/A;    CHOLECYSTECTOMY     MELANOMA EXCISION     x5   Family History  Problem Relation Age of Onset   Congestive Heart Failure Mother    Heart failure Mother    Heart attack Father        age 60   Heart Problems Brother    Cancer Brother    Cancer Brother        pancreatic    Cancer Sister        breast    Cancer Sister        breast   Breast cancer Neg Hx     Social History   Tobacco Use   Smoking status: Former    Types: Cigarettes    Quit date: 02/25/1988    Years since quitting: 33.4   Smokeless tobacco: Never  Substance Use Topics   Alcohol use: No   Marital Status: Married   ROS  Review of Systems  Constitutional: Positive for malaise/fatigue. Negative for weight gain.  Cardiovascular:  Negative for chest pain, claudication, leg swelling, near-syncope, orthopnea, palpitations, paroxysmal nocturnal dyspnea and syncope.  Respiratory:  Negative for shortness of breath.   Neurological:  Positive for dizziness.   Objective  Blood pressure 124/66, pulse (!) 58, temperature 98.2 F (36.8 C), temperature source Temporal, resp. rate 16, height 5\' 3"  (1.6 m), weight 132 lb 9.6 oz (60.1 kg), SpO2 99 %.     08/15/2021   11:02 AM 06/07/2021    6:34 PM 04/17/2021   10:26 AM  Vitals with BMI  Height 5'  3"  5\' 3"   Weight 132 lbs 10 oz  129 lbs 3 oz  BMI 23.49  22.89  Systolic 124 152 045  Diastolic 66 67 60  Pulse 58 62 70    Orthostatic VS for the past 72 hrs (Last 3 readings):  Orthostatic BP Patient Position BP Location Cuff Size Orthostatic Pulse  08/15/21 1114 129/49 Standing Left Arm Normal (!) 48  08/15/21 1113 137/54 Sitting Left Arm Normal (!) 47  08/15/21 1112 127/53 Supine Left Arm Normal (!) 46     Physical Exam Vitals reviewed.  Cardiovascular:     Rate and Rhythm: Regular rhythm. Bradycardia present. No extrasystoles are present.    Pulses: Intact distal pulses.          Carotid pulses are  on the left side with bruit.    Heart sounds: S1 normal and S2  normal. No murmur heard.    No gallop.  Pulmonary:     Effort: Pulmonary effort is normal. No respiratory distress.     Breath sounds: No wheezing, rhonchi or rales.  Musculoskeletal:     Right lower leg: No edema.     Left lower leg: No edema.  Neurological:     Mental Status: She is alert.    Laboratory examination:   Recent Labs    10/23/20 1519 10/25/20 0257 11/15/20 1721 01/07/21 1007 01/31/21 1317 02/07/21 1211  NA 136 136 134* 134 138 136  K 3.3* 3.9 4.0 4.0 3.8 3.2*  CL 106 104 101 95* 99 90*  CO2 24 26 25 21 25 27   GLUCOSE 162* 139* 186* 187* 133* 133*  BUN 6* 8 13 12 11 17   CREATININE 0.65 0.71 0.85 0.91 0.79 0.94  CALCIUM 8.7* 8.9 9.0 9.4 9.7 9.8  GFRNONAA >60 >60 >60  --   --   --    CrCl cannot be calculated (Patient's most recent lab result is older than the maximum 21 days allowed.).     Latest Ref Rng & Units 02/07/2021   12:11 PM 01/31/2021    1:17 PM 01/07/2021   10:07 AM  CMP  Glucose 70 - 99 mg/dL 409  811  914   BUN 8 - 27 mg/dL 17  11  12    Creatinine 0.57 - 1.00 mg/dL 7.82  9.56  2.13   Sodium 134 - 144 mmol/L 136  138  134   Potassium 3.5 - 5.2 mmol/L 3.2  3.8  4.0   Chloride 96 - 106 mmol/L 90  99  95   CO2 20 - 29 mmol/L 27  25  21    Calcium 8.7 - 10.3 mg/dL 9.8  9.7  9.4       Latest Ref Rng & Units 02/07/2021   12:11 PM 11/15/2020    5:21 PM 10/25/2020    2:57 AM  CBC  WBC 3.4 - 10.8 x10E3/uL 8.3  7.2  4.7   Hemoglobin 11.1 - 15.9 g/dL 08.6  57.8  46.9   Hematocrit 34.0 - 46.6 % 35.3  32.9  30.0   Platelets 150 - 450 x10E3/uL 293  250  293     Lipid Panel No results for input(s): "CHOL", "TRIG", "LDLCALC", "VLDL", "HDL", "CHOLHDL", "LDLDIRECT" in the last 8760 hours.  HEMOGLOBIN A1C Lab Results  Component Value Date   HGBA1C 5.6 10/23/2020   MPG 114 10/23/2020   TSH Recent Labs    10/23/20 1519  TSH 1.149  External labs:  None   Allergies   Allergies  Allergen Reactions   Amoxicillin Swelling and Other (See  Comments)    Tongue swelling  Has patient had a PCN reaction causing immediate rash, facial/tongue/throat swelling, SOB or lightheadedness with hypotension: Yes Has patient had a PCN reaction causing severe rash involving mucus membranes or skin necrosis: Unknown Has patient had a PCN reaction that required hospitalization: Unknown Has patient had a PCN reaction occurring within the last 10 years: Unknown If all of the above answers are "NO", then may proceed with Cephalosporin use.    Amoxicillin-Pot Clavulanate Swelling and Other (See Comments)    Swollen and red tongue   Codeine Nausea And Vomiting   Latex Rash and Other (See Comments)    NO BAND-AIDS!!!!   Tape Rash and Other (See Comments)    NO BAND-AIDS OR TAPE!!!!    Medications Prior to Visit:   Outpatient Medications Prior to Visit  Medication Sig Dispense Refill   amLODipine (NORVASC) 10 MG tablet Take 1 tablet (10 mg total) by mouth daily. (Patient taking differently: Take 10 mg by mouth at bedtime.) 90 tablet 3   apixaban (ELIQUIS) 2.5 MG TABS tablet Take 1 tablet (2.5 mg total) by mouth 2 (two) times daily. 60 tablet 0   Calcium Carb-Cholecalciferol (CALCIUM 600 + D PO) Take 1 tablet by mouth daily.     Carboxymethylcellulose Sodium 1 % GEL Place 1 application into both eyes at bedtime as needed (dry eyes).     Ferrous Sulfate Dried (SLOW RELEASE IRON) 45 MG TBCR Take 45 mg by mouth every other day.     Multiple Vitamin (MULTIVITAMIN WITH MINERALS) TABS tablet Take 1 tablet by mouth daily.     Olopatadine HCl (PATADAY OP) Place 1 drop into both eyes daily as needed (allergies).     omeprazole (PRILOSEC) 40 MG capsule Take 40 mg by mouth daily as needed (heartburn).     pentoxifylline (TRENTAL) 400 MG CR tablet Take 400 mg by mouth 3 (three) times daily with meals.     Polyethyl Glycol-Propyl Glycol (SYSTANE OP) Place 1 drop into both eyes daily as needed (dry eyes).     spironolactone (ALDACTONE) 25 MG tablet Take 0.5  tablets (12.5 mg total) by mouth daily. 90 tablet 0   zolpidem (AMBIEN) 10 MG tablet Take 5 mg by mouth at bedtime.     amiodarone (PACERONE) 200 MG tablet Take 1 tablet (200 mg total) by mouth daily.     metoprolol tartrate (LOPRESSOR) 50 MG tablet Take 25 mg by mouth 2 (two) times daily.     ALPRAZolam (XANAX) 0.25 MG tablet Take 0.125 mg by mouth 2 (two) times daily as needed for anxiety (palpitations). (Patient not taking: Reported on 08/15/2021)     Cholecalciferol (VITAMIN D-3) 25 MCG (1000 UT) CAPS Take 1,000 Units by mouth daily. (Patient not taking: Reported on 08/15/2021)     furosemide (LASIX) 40 MG tablet Take 1 tablet (40 mg total) by mouth 2 (two) times daily as needed for fluid or edema. (Patient taking differently: Take 40 mg by mouth 2 (two) times daily.) 60 tablet 3   Zinc 50 MG CAPS Take 50 mg by mouth daily. (Patient not taking: Reported on 08/15/2021)     No facility-administered medications prior to visit.   Final Medications at End of Visit    Current Meds  Medication Sig   amLODipine (NORVASC) 10 MG tablet Take 1 tablet (10 mg total) by mouth daily. (Patient taking differently: Take 10 mg by mouth at bedtime.)  apixaban (ELIQUIS) 2.5 MG TABS tablet Take 1 tablet (2.5 mg total) by mouth 2 (two) times daily.   Calcium Carb-Cholecalciferol (CALCIUM 600 + D PO) Take 1 tablet by mouth daily.   Carboxymethylcellulose Sodium 1 % GEL Place 1 application into both eyes at bedtime as needed (dry eyes).   Ferrous Sulfate Dried (SLOW RELEASE IRON) 45 MG TBCR Take 45 mg by mouth every other day.   Multiple Vitamin (MULTIVITAMIN WITH MINERALS) TABS tablet Take 1 tablet by mouth daily.   Olopatadine HCl (PATADAY OP) Place 1 drop into both eyes daily as needed (allergies).   omeprazole (PRILOSEC) 40 MG capsule Take 40 mg by mouth daily as needed (heartburn).   pentoxifylline (TRENTAL) 400 MG CR tablet Take 400 mg by mouth 3 (three) times daily with meals.   Polyethyl Glycol-Propyl  Glycol (SYSTANE OP) Place 1 drop into both eyes daily as needed (dry eyes).   spironolactone (ALDACTONE) 25 MG tablet Take 0.5 tablets (12.5 mg total) by mouth daily.   zolpidem (AMBIEN) 10 MG tablet Take 5 mg by mouth at bedtime.   [DISCONTINUED] amiodarone (PACERONE) 200 MG tablet Take 1 tablet (200 mg total) by mouth daily.   [DISCONTINUED] metoprolol tartrate (LOPRESSOR) 50 MG tablet Take 25 mg by mouth 2 (two) times daily.   Radiology:   No results found.  Cardiac Studies:   Carotid artery duplex December 29, 2020:  No evidence of significant stenosis in the right carotid vessels.  Duplex suggests stenosis in the left internal carotid artery (16-49%).  Duplex suggests stenosis in the left external carotid artery (<50%).  Antegrade right vertebral artery flow. Antegrade left vertebral artery flow.  Follow up in one year is appropriate if clinically indicated.v  Direct current cardioversion 12/04/2020 9:51 AM Indication symptomatic A. Fibrillation. Procedure: Using 50 mg of IV Propofol and 60 IV Lidocaine (for reducing venous pain) for achieving deep sedation, synchronized direct current cardioversion performed. Patient was delivered with 120 Joules of electricity X 1 with success to NSR. Patient tolerated the procedure well. No immediate complication noted.   PCV MYOCARDIAL PERFUSION WITH LEXISCAN 01/09/2021 Nondiagnostic ECG stress. Resting AF with RVR, no change with Lexiscan infusion. Myocardial perfusion is normal. Overall LV systolic function is normal without regional wall motion abnormalities. Stress LV EF: 42% although visually the LVEF is normal in size and function. No previous exam available for comparison. Low risk study.  Echocardiogram 02/28/2021:  Normal LV systolic function with visual EF 60-65%. Left ventricle cavity  is normal in size. Normal left ventricular wall thickness. Normal global  wall motion. Doppler evidence of grade II (pseudonormal) diastolic  dysfunction,  elevated LAP.  Moderate tricuspid regurgitation. Mild to moderate pulmonary hypertension.  RVSP measures 44 mmHg.  Compared to study 10/23/2020 Grade II DD and elevated LAP are new  findings.   EKG:  08/15/2021: Marked sinus bradycardia at a rate of 45 bpm.  Normal axis  No evidence of ischemia or underlying injury pattern.  Low voltage complexes, consider pulmonary disease pattern.  04/17/2021: Sinus bradycardia at a rate of 50 bpm.  Normal axis.  Incomplete right bundle branch block.  Low voltage complexes, consider pulmonary disease pattern. Compared to EKG 03/19/2021 no significant change.   02/07/2021: Atrial fibrillation with controlled ventricular response at a rate of 76 bpm.  01/09/2021: Atrial fibrillation with ventricular rate of 111 bpm.  12/18/2020: Sinus bradycardia at a rate of 55 bpm with single PAC.  EKG 11/22/2020: Atrial fibrillation with controlled ventricular sponsor rate of 94 bpm.  Normal axis.  No evidence of ischemia or underlying injury pattern.  Assessment     ICD-10-CM   1. Paroxysmal A-fib (HCC)  I48.0 EKG 12-Lead    2. Other fatigue  R53.83     3. Sinus bradycardia  R00.1      Medications Discontinued During This Encounter  Medication Reason   Zinc 50 MG CAPS    metoprolol tartrate (LOPRESSOR) 50 MG tablet Side effect (s)   amiodarone (PACERONE) 200 MG tablet Reorder     Meds ordered this encounter  Medications   amiodarone (PACERONE) 200 MG tablet    Sig: Take 0.5 tablets (100 mg total) by mouth daily.    Dispense:  15 tablet    Refill:  3   This patients CHA2DS2-VASc Score 4 (HTN, A, F) and yearly risk of stroke 4.8%.   Recommendations:   Kristi Montgomery is a 88 y.o. Caucasian female with history of hypertension, chronic iron deficiency anemia, anxiety, and GERD, as well as history of tobacco use (quit in 2000) and family history of premature CAD.  She also has history of COVID-19 infection in early 09/2020.  Diagnosed with A. fib 09/2020.   Patient was started on Eliquis 10/23/2020.  Due to recurrence of atrial fibrillation and recurrent heart failure/volume overload shared decision was to proceed with cardioversion.  However when patient presented for cardioversion on 03/05/2021 she had spontaneously converted to normal sinus rhythm.  Patient was last seen 04/17/2021 at which time had independently held both spironolactone and metoprolol in order to determine if these were contributing to patient's fatigue, however fatigue did not improve holding these medications, therefore she was advised to restart them.  She was otherwise stable from a cardiovascular standpoint and referred to physical therapy to improve conditioning.  She now presents for urgent visit at her request with complaints of fatigue and dizziness worsening over the last 1 week.  Patient is not orthostatic at this time.  However EKG does reveal marked sinus bradycardia.  We will therefore discontinue metoprolol tartrate as well as reduce amiodarone from 200 mg to 100 mg p.o. daily.  We will follow patient closely with repeat EKG and 1 week.  Further recommendations pending results of this.  The patient regarding signs and symptoms that would warrant urgent or emergent evaluation, patient verbalized understanding agreement.   Rayford Halsted, PA-C 08/15/2021, 1:03 PM Office: [phone]

## 2021-08-15 ENCOUNTER — Encounter: Payer: Self-pay | Admitting: Student

## 2021-08-15 ENCOUNTER — Ambulatory Visit: Payer: Medicare Other | Admitting: Student

## 2021-08-15 VITALS — BP 124/66 | HR 58 | Temp 98.2°F | Resp 16 | Ht 63.0 in | Wt 132.6 lb

## 2021-08-15 DIAGNOSIS — R001 Bradycardia, unspecified: Secondary | ICD-10-CM

## 2021-08-15 DIAGNOSIS — I480 Paroxysmal atrial fibrillation: Secondary | ICD-10-CM

## 2021-08-15 DIAGNOSIS — R5383 Other fatigue: Secondary | ICD-10-CM

## 2021-08-15 MED ORDER — AMIODARONE HCL 200 MG PO TABS
100.0000 mg | ORAL_TABLET | Freq: Every day | ORAL | 3 refills | Status: DC
Start: 2021-08-15 — End: 2022-04-15

## 2021-08-19 ENCOUNTER — Telehealth: Payer: Self-pay

## 2021-08-19 NOTE — Progress Notes (Deleted)
Primary Physician/Referring:  Shon Baton, MD  Patient ID: Kristi Montgomery, female    DOB: 1930-06-16, 88 y.o.   MRN: [id number]  No chief complaint on file.   HPI:    Kristi Montgomery  is a 88 y.o. Caucasian female with history of hypertension, chronic iron deficiency anemia, anxiety, and GERD, as well as history of tobacco use (quit in 2000) and family history of premature CAD.  She also has history of COVID-19 infection in early 09/2020.  Diagnosed with A. fib 09/2020.  Patient was started on Eliquis 10/23/2020.  Due to recurrence of atrial fibrillation and recurrent heart failure/volume overload shared decision was to proceed with cardioversion.  However when patient presented for cardioversion on 03/05/2021 she had spontaneously converted to normal sinus rhythm.  Patient was last seen 86/22/2023 at which time had independently held both spironolactone and metoprolol in order to determine if these were contributing to patient's fatigue, however fatigue did not improve holding these medications, therefore she was advised to restart them.    Patient was seen for urgent visit 08/15/2021 with complaints of fatigue and dizziness at which time EKG revealed marked sinus bradycardia, therefore advised patient to discontinue metoprolol tartrate and reduced amiodarone from 200 mg to 100 mg p.o. daily. She now presents for 1 week follow up with repeat EKG. ***  ***  She was otherwise stable from a cardiovascular standpoint and referred to physical therapy to improve conditioning.  She now presents for urgent visit at her request with complaints of worsening fatigue and dizziness over the last 3 to 5 days.  She denies chest pain, significant dyspnea, syncope, near syncope.  She continues to tolerate anticoagulation without bleeding diathesis.  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/06/2017   Past Surgical History:  Procedure Laterality Date   CARDIOVERSION N/A 12/04/2020   Procedure: CARDIOVERSION;  Surgeon: [name], MD;  Location: [facility] ENDOSCOPY;  Service: Cardiovascular;  Laterality: N/A;   CHOLECYSTECTOMY     MELANOMA EXCISION     x5   Family History  Problem Relation Age of Onset   Congestive Heart Failure Mother    Heart failure Mother    Heart attack Father        age 37   Heart Problems Brother    Cancer Brother    Cancer Brother        pancreatic    Cancer Sister        breast    Cancer Sister        breast   Breast cancer Neg Hx     Social History   Tobacco Use   Smoking status: Former    Types: Cigarettes    Quit date: 02/25/1988    Years since quitting: 33.5   Smokeless tobacco: Never  Substance Use Topics   Alcohol use: No   Marital Status: Married   ROS  Review of Systems  Constitutional: Positive for malaise/fatigue. Negative for weight gain.  Cardiovascular:  Negative for chest pain, claudication, leg swelling, near-syncope, orthopnea, palpitations, paroxysmal nocturnal dyspnea and syncope.  Respiratory:  Negative for shortness of breath.   Neurological:  Positive for dizziness.   Objective  There were no vitals taken for this visit.     08/15/2021  11:02 AM 06/07/2021    6:34 PM 04/17/2021   10:26 AM  Vitals with BMI  Height '5\' 3"'$   '5\' 3"'$   Weight 132 lbs 10 oz  129 lbs 3 oz  BMI 37.10  62.69  Systolic 485 462 703  Diastolic 66 67 60  Pulse 58 62 70    No data found.    Physical Exam Vitals reviewed.  Cardiovascular:     Rate and Rhythm: Regular rhythm. Bradycardia present. No extrasystoles are present.    Pulses: Intact distal pulses.          Carotid pulses are  on the left side with bruit.    Heart sounds: S1 normal and S2 normal. No murmur heard.    No gallop.  Pulmonary:     Effort: Pulmonary effort is normal. No respiratory distress.     Breath sounds: No  wheezing, rhonchi or rales.  Musculoskeletal:     Right lower leg: No edema.     Left lower leg: No edema.  Neurological:     Mental Status: She is alert.    Laboratory examination:   Recent Labs    10/23/20 1519 10/25/20 0257 11/15/20 1721 01/07/21 1007 01/31/21 1317 02/07/21 1211  NA 136 136 134* 134 138 136  K 3.3* 3.9 4.0 4.0 3.8 3.2*  CL 106 104 101 95* 99 90*  CO2 '24 26 25 21 25 27  '$ GLUCOSE 162* 139* 186* 187* 133* 133*  BUN 6* '8 13 12 11 17  '$ CREATININE 0.65 0.71 0.85 0.91 0.79 0.94  CALCIUM 8.7* 8.9 9.0 9.4 9.7 9.8  GFRNONAA >60 >60 >60  --   --   --     CrCl cannot be calculated (Patient's most recent lab result is older than the maximum 21 days allowed.).     Latest Ref Rng & Units 02/07/2021   12:11 PM 01/31/2021    1:17 PM 01/07/2021   10:07 AM  CMP  Glucose 70 - 99 mg/dL 133  133  187   BUN 8 - 27 mg/dL '17  11  12   '$ Creatinine 0.57 - 1.00 mg/dL 0.94  0.79  0.91   Sodium 134 - 144 mmol/L 136  138  134   Potassium 3.5 - 5.2 mmol/L 3.2  3.8  4.0   Chloride 96 - 106 mmol/L 90  99  95   CO2 20 - 29 mmol/L '27  25  21   '$ Calcium 8.7 - 10.3 mg/dL 9.8  9.7  9.4       Latest Ref Rng & Units 02/07/2021   12:11 PM 11/15/2020    5:21 PM 10/25/2020    2:57 AM  CBC  WBC 3.4 - 10.8 x10E3/uL 8.3  7.2  4.7   Hemoglobin 11.1 - 15.9 g/dL 12.3  11.4  10.6   Hematocrit 34.0 - 46.6 % 35.3  32.9  30.0   Platelets 150 - 450 x10E3/uL 293  250  293     Lipid Panel No results for input(s): "CHOL", "TRIG", "[place]", "VLDL", "HDL", "CHOLHDL", "LDLDIRECT" in the last 8760 hours.  HEMOGLOBIN A1C Lab Results  Component Value Date   HGBA1C 5.6 10/23/2020   MPG 114 10/23/2020   TSH Recent Labs    10/23/20 1519  TSH 1.149   External labs:  None   Allergies   Allergies  Allergen Reactions   Amoxicillin Swelling and Other (See Comments)    Tongue swelling  Has patient had a PCN reaction causing immediate rash, facial/tongue/throat  swelling, SOB or lightheadedness  with hypotension: Yes Has patient had a PCN reaction causing severe rash involving mucus membranes or skin necrosis: Unknown Has patient had a PCN reaction that required hospitalization: Unknown Has patient had a PCN reaction occurring within the last 10 years: Unknown If all of the above answers are "NO", then may proceed with Cephalosporin use.    Amoxicillin-Pot Clavulanate Swelling and Other (See Comments)    Swollen and red tongue   Codeine Nausea And Vomiting   Latex Rash and Other (See Comments)    NO BAND-AIDS!!!!   Tape Rash and Other (See Comments)    NO BAND-AIDS OR TAPE!!!!    Medications Prior to Visit:   Outpatient Medications Prior to Visit  Medication Sig Dispense Refill   ALPRAZolam (XANAX) 0.25 MG tablet Take 0.125 mg by mouth 2 (two) times daily as needed for anxiety (palpitations). (Patient not taking: Reported on 08/15/2021)     amiodarone (PACERONE) 200 MG tablet Take 0.5 tablets (100 mg total) by mouth daily. 15 tablet 3   amLODipine (NORVASC) 10 MG tablet Take 1 tablet (10 mg total) by mouth daily. (Patient taking differently: Take 10 mg by mouth at bedtime.) 90 tablet 3   apixaban (ELIQUIS) 2.5 MG TABS tablet Take 1 tablet (2.5 mg total) by mouth 2 (two) times daily. 60 tablet 0   Calcium Carb-Cholecalciferol (CALCIUM 600 + D PO) Take 1 tablet by mouth daily.     Carboxymethylcellulose Sodium 1 % GEL Place 1 application into both eyes at bedtime as needed (dry eyes).     Cholecalciferol (VITAMIN D-3) 25 MCG (1000 UT) CAPS Take 1,000 Units by mouth daily. (Patient not taking: Reported on 08/15/2021)     Ferrous Sulfate Dried (SLOW RELEASE IRON) 45 MG TBCR Take 45 mg by mouth every other day.     furosemide (LASIX) 40 MG tablet Take 1 tablet (40 mg total) by mouth 2 (two) times daily as needed for fluid or edema. (Patient taking differently: Take 40 mg by mouth 2 (two) times daily.) 60 tablet 3   Multiple Vitamin (MULTIVITAMIN WITH MINERALS) TABS tablet Take 1 tablet  by mouth daily.     Olopatadine HCl (PATADAY OP) Place 1 drop into both eyes daily as needed (allergies).     omeprazole (PRILOSEC) 40 MG capsule Take 40 mg by mouth daily as needed (heartburn).     pentoxifylline (TRENTAL) 400 MG CR tablet Take 400 mg by mouth 3 (three) times daily with meals.     Polyethyl Glycol-Propyl Glycol (SYSTANE OP) Place 1 drop into both eyes daily as needed (dry eyes).     spironolactone (ALDACTONE) 25 MG tablet Take 0.5 tablets (12.5 mg total) by mouth daily. 90 tablet 0   zolpidem (AMBIEN) 10 MG tablet Take 5 mg by mouth at bedtime.     No facility-administered medications prior to visit.   Final Medications at End of Visit    No outpatient medications have been marked as taking for the 08/20/21 encounter (Appointment) with Rayetta Pigg, Makena Mcgrady C, PA-C.   Radiology:   No results found.  Cardiac Studies:   Carotid artery duplex 12-23-20:  No evidence of significant stenosis in the right carotid vessels.  Duplex suggests stenosis in the left internal carotid artery (16-49%).  Duplex suggests stenosis in the left external carotid artery (<50%).  Antegrade right vertebral artery flow. Antegrade left vertebral artery flow.  Follow up in one year is appropriate if clinically indicated.v  Direct current cardioversion 12/04/2020 9:51 AM  Indication symptomatic A. Fibrillation. Procedure: Using 50 mg of IV Propofol and 60 IV Lidocaine (for reducing venous pain) for achieving deep sedation, synchronized direct current cardioversion performed. Patient was delivered with 120 Joules of electricity X 1 with success to NSR. Patient tolerated the procedure well. No immediate complication noted.   PCV MYOCARDIAL PERFUSION WITH LEXISCAN 01/09/2021 Nondiagnostic ECG stress. Resting AF with RVR, no change with Lexiscan infusion. Myocardial perfusion is normal. Overall LV systolic function is normal without regional wall motion abnormalities. Stress LV EF: 42% although  visually the LVEF is normal in size and function. No previous exam available for comparison. Low risk study.  Echocardiogram 02/28/2021:  Normal LV systolic function with visual EF 60-65%. Left ventricle cavity  is normal in size. Normal left ventricular wall thickness. Normal global  wall motion. Doppler evidence of grade II (pseudonormal) diastolic  dysfunction, elevated LAP.  Moderate tricuspid regurgitation. Mild to moderate pulmonary hypertension.  RVSP measures 44 mmHg.  Compared to study 10/23/2020 Grade II DD and elevated LAP are new  findings.   EKG:  ***   08/15/2021: Marked sinus bradycardia at a rate of 45 bpm.  Normal axis  No evidence of ischemia or underlying injury pattern.  Low voltage complexes, consider pulmonary disease pattern.  04/17/2021: Sinus bradycardia at a rate of 50 bpm.  Normal axis.  Incomplete right bundle branch block.  Low voltage complexes, consider pulmonary disease pattern. Compared to EKG 03/19/2021 no significant change.   02/07/2021: Atrial fibrillation with controlled ventricular response at a rate of 76 bpm.  01/09/2021: Atrial fibrillation with ventricular rate of 111 bpm.  12/18/2020: Sinus bradycardia at a rate of 55 bpm with single PAC.  EKG 11/22/2020: Atrial fibrillation with controlled ventricular sponsor rate of 94 bpm.  Normal axis.  No evidence of ischemia or underlying injury pattern.  Assessment   No diagnosis found.  There are no discontinued medications.    No orders of the defined types were placed in this encounter.  This patients CHA2DS2-VASc Score 4 (HTN, A, F) and yearly risk of stroke 4.8%.   Recommendations:   Kristi Montgomery is a 88 y.o. Caucasian female with history of hypertension, chronic iron deficiency anemia, anxiety, and GERD, as well as history of tobacco use (quit in 2000) and family history of premature CAD.  She also has history of COVID-19 infection in early 09/2020.  Diagnosed with A. fib 09/2020.   Patient was started on Eliquis 10/23/2020.  Due to recurrence of atrial fibrillation and recurrent heart failure/volume overload shared decision was to proceed with cardioversion.  However when patient presented for cardioversion on 03/05/2021 she had spontaneously converted to normal sinus rhythm.  Patient was seen for urgent visit 08/15/2021 with complaints of fatigue and dizziness at which time EKG revealed marked sinus bradycardia, therefore advised patient to discontinue metoprolol tartrate and reduced amiodarone from 200 mg to 100 mg p.o. daily. She now presents for 1 week follow up with repeat EKG. ***  ***  Patient was last seen 86/22/2023 at which time had independently held both spironolactone and metoprolol in order to determine if these were contributing to patient's fatigue, however fatigue did not improve holding these medications, therefore she was advised to restart them.  She was otherwise stable from a cardiovascular standpoint and referred to physical therapy to improve conditioning.  She now presents for urgent visit at her request with complaints of fatigue and dizziness worsening over the last 1 week.  Patient is not orthostatic at this  time.  However EKG does reveal marked sinus bradycardia.  We will therefore discontinue metoprolol tartrate as well as reduce amiodarone from 200 mg to 100 mg p.o. daily.  We will follow patient closely with repeat EKG and 1 week.  Further recommendations pending results of this.  The patient regarding signs and symptoms that would warrant urgent or emergent evaluation, patient verbalized understanding agreement.   Alethia Berthold, PA-C 08/19/2021, 9:56 AM Office: [phone]

## 2021-08-19 NOTE — Telephone Encounter (Signed)
Heart rate looks good, but I would still like to repeat and EKG and BP check, even if it is just a nurse visit. Please reschedule

## 2021-08-19 NOTE — Telephone Encounter (Signed)
Pt called and stated that she will not be able to make it to her appt tomorrow 08/20/2021. She wanted to let you know that her pulse has been fine. HR is as listed below: 06/22- 59 06/23- 60 06/24- 76 06/25- 65 06/26- 57

## 2021-08-19 NOTE — Telephone Encounter (Signed)
Called and spoke to pt, pt voiced understanding. Pt coming in for a nurse visit 09/06/2021 @ 11:00 am.

## 2021-08-20 ENCOUNTER — Ambulatory Visit: Payer: Medicare Other | Admitting: Student

## 2021-09-03 ENCOUNTER — Encounter: Payer: Self-pay | Admitting: Emergency Medicine

## 2021-09-03 ENCOUNTER — Other Ambulatory Visit: Payer: Self-pay

## 2021-09-03 ENCOUNTER — Ambulatory Visit
Admission: EM | Admit: 2021-09-03 | Discharge: 2021-09-03 | Disposition: A | Discharge status: Home / Self Care | Payer: Medicare Other

## 2021-09-03 DIAGNOSIS — M79672 Pain in left foot: Secondary | ICD-10-CM

## 2021-09-03 NOTE — Discharge Instructions (Signed)
Please follow-up with foot doctor at contact information today to schedule an appointment for further evaluation and management.

## 2021-09-03 NOTE — ED Provider Notes (Signed)
[facility]    CSN: [id number] Arrival date & time: 09/03/21  1345      History   Chief Complaint Chief Complaint  Patient presents with   Foot Pain    HPI JIMMYE WISNIESKI is a 88 y.o. female.   Patient presents with persistent left foot pain since injury in April.  Patient was seen on 06/07/2021 after dropping bathroom aerosol can on her foot.  She had a negative foot x-ray at that time.  She states that she has been trying to rest her foot but she has been having persistent swelling and foot pain since injury occurred.  Denies any other recent injuries.  Denies numbness or tingling.  Pain occurs mainly with bearing weight.  She has not taken any medications for pain.  She does take Eliquis as well.   Foot Pain    Past Medical History:  Diagnosis Date   Anxiety    Cancer [facility])    Melanoma   Cataract    Chest pain    myoview 04/01/12-normal   History of tobacco abuse    HTN (hypertension)    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/06/2017    Patient Active Problem List   Diagnosis Date Noted   Paroxysmal A-fib ([place]) 01/13/2021   Atrial fibrillation with rapid ventricular response ([place]) 10/23/2020   Persistent atrial fibrillation ([place]) 10/22/2020   Bilateral temporomandibular joint pain 09/24/2017   Presbycusis of both ears 09/24/2017   Temporal arteritis ([place]) 09/24/2017   Blepharospasm 09/15/2017   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/06/2017   Bilateral dry eyes 07/13/2014   Essential hypertension 07/29/2012   Choroidal nevus 07/10/2011   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   Nuclear cataract 07/10/2011   CHEST PAIN UNSPECIFIED 01/25/2009   DYSPHAGIA UNSPECIFIED 01/25/2009    Past Surgical History:  Procedure Laterality Date   CARDIOVERSION N/A 12/04/2020   Procedure: CARDIOVERSION;  Surgeon: [name], MD;  Location: [place];  Service: Cardiovascular;  Laterality: N/A;    CHOLECYSTECTOMY     MELANOMA EXCISION     x5    OB History     Gravida      Para      Term      Preterm      AB      Living  2      SAB      IAB      Ectopic      Multiple      Live Births               Home Medications    Prior to Admission medications   Medication Sig Start Date End Date Taking? Authorizing Provider  ALPRAZolam (XANAX) 0.25 MG tablet Take 0.125 mg by mouth 2 (two) times daily as needed for anxiety (palpitations). Patient not taking: Reported on 08/15/2021 12/09/17   [provider]  amiodarone (PACERONE) 200 MG tablet Take 0.5 tablets (100 mg total) by mouth daily. 08/15/21   Cantwell, Celeste C, PA-C  amLODipine (NORVASC) 10 MG tablet Take 1 tablet (10 mg total) by mouth daily. Patient taking differently: Take 10 mg by mouth at bedtime. 12/18/20 12/13/21  Cantwell, Celeste C, PA-C  apixaban (ELIQUIS) 2.5 MG TABS tablet Take 1 tablet (2.5 mg total) by mouth 2 (two) times daily. 10/25/20   Domenic Polite, MD  Calcium Carb-Cholecalciferol (CALCIUM 600 + D PO) Take 1 tablet by mouth daily.    [provider]  Carboxymethylcellulose Sodium 1 % GEL Place 1 application into both eyes at bedtime as needed (dry eyes).    [provider]  Cholecalciferol (VITAMIN D-3) 25 MCG (1000 UT) CAPS Take 1,000 Units by mouth daily. Patient not taking: Reported on 08/15/2021    [provider]  Ferrous Sulfate Dried (SLOW RELEASE IRON) 45 MG TBCR Take 45 mg by mouth every other day.    [provider]  furosemide (LASIX) 40 MG tablet Take 1 tablet (40 mg total) by mouth 2 (two) times daily as needed for fluid or edema. Patient taking differently: Take 40 mg by mouth 2 (two) times daily. 02/07/21 06/07/21  Cantwell, Celeste C, PA-C  Multiple Vitamin (MULTIVITAMIN WITH MINERALS) TABS tablet Take 1 tablet by mouth daily.    [provider]  Olopatadine HCl (PATADAY OP) Place 1 drop into both eyes daily as needed  (allergies).    [provider]  omeprazole (PRILOSEC) 40 MG capsule Take 40 mg by mouth daily as needed (heartburn).    [provider]  pentoxifylline (TRENTAL) 400 MG CR tablet Take 400 mg by mouth 3 (three) times daily with meals.    [provider]  Polyethyl Glycol-Propyl Glycol (SYSTANE OP) Place 1 drop into both eyes daily as needed (dry eyes).    [provider]  spironolactone (ALDACTONE) 25 MG tablet Take 0.5 tablets (12.5 mg total) by mouth daily. 05/13/21   Cantwell, Celeste C, PA-C  zolpidem (AMBIEN) 10 MG tablet Take 5 mg by mouth at bedtime. 09/18/20   [provider]    Family History Family History  Problem Relation Age of Onset   Congestive Heart Failure Mother    Heart failure Mother    Heart attack Father        age 3   Heart Problems Brother    Cancer Brother    Cancer Brother        pancreatic    Cancer Sister        breast    Cancer Sister        breast   Breast cancer Neg Hx     Social History Social History   Tobacco Use   Smoking status: Former    Types: Cigarettes    Quit date: 02/25/1988    Years since quitting: 33.5   Smokeless tobacco: Never  Vaping Use   Vaping Use: Never used  Substance Use Topics   Alcohol use: No   Drug use: No     Allergies   Amoxicillin, Amoxicillin-pot clavulanate, Codeine, Latex, and Tape   Review of Systems Review of Systems Per HPI  Physical Exam Triage Vital Signs ED Triage Vitals  Enc Vitals Group     BP 09/03/21 1419 (!) 143/63     Pulse Rate 09/03/21 1419 60     Resp 09/03/21 1419 18     Temp 09/03/21 1419 98.3 F (36.8 C)     Temp Source 09/03/21 1419 Oral     SpO2 09/03/21 1419 97 %     Weight --      Height --      Head Circumference --      Peak Flow --      Pain Score 09/03/21 1420 2     Pain Loc --      Pain Edu? --      Excl. in [place]? --    No data found.  Updated Vital Signs BP (!) 143/63 (BP Location: Left Arm)  Pulse 60   Temp  98.3 F (36.8 C) (Oral)   Resp 18   SpO2 97%   Visual Acuity Right Eye Distance:   Left Eye Distance:   Bilateral Distance:    Right Eye Near:   Left Eye Near:    Bilateral Near:     Physical Exam Constitutional:      General: She is not in acute distress.    Appearance: Normal appearance. She is not toxic-appearing or diaphoretic.  HENT:     Head: Normocephalic and atraumatic.  Eyes:     Extraocular Movements: Extraocular movements intact.     Conjunctiva/sclera: Conjunctivae normal.  Pulmonary:     Effort: Pulmonary effort is normal.  Musculoskeletal:       Feet:  Feet:     Comments: Mild swelling across dorsal surface of foot from second through fifth metatarsal.  No obvious discoloration, lacerations, abrasions, warmth noted.  Patient can wiggle toes.  Capillary refill and pulses normal.  Patient has full range of motion.  Mild tenderness to palpation where swelling is located. Neurological:     General: No focal deficit present.     Mental Status: She is alert and oriented to person, place, and time. Mental status is at baseline.  Psychiatric:        Mood and Affect: Mood normal.        Behavior: Behavior normal.        Thought Content: Thought content normal.        Judgment: Judgment normal.      UC Treatments / Results  Labs (all labs ordered are listed, but only abnormal results are displayed) Labs Reviewed - No data to display  EKG   Radiology No results found.  Procedures Procedures (including critical care time)  Medications Ordered in UC Medications - No data to display  Initial Impression / Assessment and Plan / UC Course  I have reviewed the triage vital signs and the nursing notes.  Pertinent labs & imaging results that were available during my care of the patient were reviewed by me and considered in my medical decision making (see chart for details).     Patient is neurovascularly intact and no signs of infection so do not think that  emergent evaluation is necessary at this time.  Given persistent foot swelling and pain since injury, I do think she needs to follow-up with specialist.  Patient was given contact information for podiatry to schedule an appointment as soon as possible for further evaluation and management.  Patient was given strict return and ER precautions.  Patient verbalized understanding and was agreeable with plan. Final Clinical Impressions(s) / UC Diagnoses   Final diagnoses:  Left foot pain     Discharge Instructions      Please follow-up with foot doctor at contact information today to schedule an appointment for further evaluation and management.    ED Prescriptions   None    PDMP not reviewed this encounter.   Teodora Medici, [place] 09/03/21 1514

## 2021-09-03 NOTE — ED Triage Notes (Signed)
Pt here for left foot pain and swelling x months since injury; pt was seen and had negative xray at time of injury

## 2021-09-06 ENCOUNTER — Ambulatory Visit: Payer: Medicare Other

## 2021-09-06 ENCOUNTER — Ambulatory Visit: Payer: Medicare Other | Admitting: Podiatry

## 2021-09-06 ENCOUNTER — Ambulatory Visit: Payer: Medicare Other | Admitting: Cardiology

## 2021-09-06 VITALS — BP 140/54 | HR 73 | Wt 131.0 lb

## 2021-09-06 DIAGNOSIS — I10 Essential (primary) hypertension: Secondary | ICD-10-CM

## 2021-09-06 DIAGNOSIS — M7752 Other enthesopathy of left foot: Secondary | ICD-10-CM | POA: Diagnosis not present

## 2021-09-06 DIAGNOSIS — M7751 Other enthesopathy of right foot: Secondary | ICD-10-CM

## 2021-09-06 DIAGNOSIS — I480 Paroxysmal atrial fibrillation: Secondary | ICD-10-CM

## 2021-09-06 MED ORDER — TRIAMCINOLONE ACETONIDE 10 MG/ML IJ SUSP
20.0000 mg | Freq: Once | INTRAMUSCULAR | Status: AC
  Administered 2021-09-06: 20 mg

## 2021-09-06 MED ORDER — METOPROLOL SUCCINATE ER 25 MG PO TB24: 25.0000 mg | ORAL_TABLET | Freq: Every evening | ORAL | 1 refills | Status: DC

## 2021-09-06 NOTE — Progress Notes (Signed)
Chief Complaint  Patient presents with   Abnormal ECG   Blood Pressure Check   NURSE VISIT   Patient was seen on 08/15/2021, due to marked sinus bradycardia with heart rate of 45 bpm, metoprolol tartrate 50 mg twice daily was discontinued and amiodarone dose was reduced to 100 mg daily from 200 daily being used for atrial fibrillation.  Since then patient has felt very jittery, feels she is hyper, it is disturbing her sleep and thinks that she should be back on at least half the dose of the medication.  Otherwise no chest pain, no palpitations.    ICD-10-CM   1. Paroxysmal A-fib (HCC)  I48.0 EKG 12-Lead    metoprolol succinate (TOPROL-XL) 25 MG 24 hr tablet    2. Primary hypertension  I10 metoprolol succinate (TOPROL-XL) 25 MG 24 hr tablet     Vitals:   09/06/21 1127  BP: (!) 140/54  Pulse: 73  SpO2: (!) 17%     Physical Exam Neck:     Vascular: No JVD.  Cardiovascular:     Rate and Rhythm: Normal rate and regular rhythm.     Pulses: Intact distal pulses.     Heart sounds: S1 normal and S2 normal. Murmur heard.     Midsystolic murmur is present with a grade of 3/6 at the upper right sternal border.     No gallop.  Pulmonary:     Effort: Pulmonary effort is normal.     Breath sounds: Normal breath sounds.  Musculoskeletal:     Right lower leg: No edema.     Left lower leg: No edema.    EKG 09/06/2021: Ectopic atrial rhythm with short PR interval at rate of 68 bpm, low voltage complexes.  No evidence of ischemia.  08/15/2021: Marked sinus bradycardia at a rate of 45 bpm.  Normal axis  No evidence of ischemia or underlying injury pattern.  Low voltage complexes, consider pulmonary disease pattern.  04/17/2021: Sinus bradycardia at a rate of 50 bpm.  Normal axis.  Incomplete right bundle branch block.  Low voltage complexes, consider pulmonary disease pattern. Compared to EKG 03/19/2021 no significant change.   02/07/2021: Atrial fibrillation with controlled ventricular  response at a rate of 76 bpm.   PCV ECHOCARDIOGRAM COMPLETE 02/28/2021  Narrative Echocardiogram 02/28/2021: Normal LV systolic function with visual EF 60-65%. Left ventricle cavity is normal in size. Normal left ventricular wall thickness. Normal global wall motion. Doppler evidence of grade II (pseudonormal) diastolic dysfunction, elevated LAP. Moderate tricuspid regurgitation. Mild to moderate pulmonary hypertension. RVSP measures 44 mmHg. Compared to study 10/23/2020 Grade II DD and elevated LAP are new findings.       ICD-10-CM   1. Paroxysmal A-fib (HCC)  I48.0 EKG 12-Lead    metoprolol succinate (TOPROL-XL) 25 MG 24 hr tablet    2. Primary hypertension  I10 metoprolol succinate (TOPROL-XL) 25 MG 24 hr tablet     Kristi Montgomery is a 88 y.o. Caucasian female with history of hypertension, chronic iron deficiency anemia, anxiety, and GERD, as well as history of tobacco use (quit in 2000) and family history of premature CAD.  She also has history of COVID-19 infection in early 09/2020.  Diagnosed with A. fib 09/2020.  Patient was started on Eliquis 10/23/2020.  Recommendation: I will restart the patient on metoprolol succinate 25 mg daily, previously was on metoprolol tartrate 50 mg twice daily.  Patient is unable to sleep well without the beta-blockers and also feels very jittery.  Today she  is in ectopic atrial rhythm.  Overall she feels her energy level is improved since maintaining sinus rhythm.  We will see her back on 10/15/2021 for follow-up as previously scheduled.  She would be a good candidate for OCEANIC-AF (Asundexian - factor XIa inhibitor PO BID vs Apixaban PO BID in patients with A. Fib for stroke prevention.   Kristi Prows, MD, [facility] 09/06/2021, 11:54 AM Office: [phone] Fax: [phone] Pager: [phone]

## 2021-09-08 NOTE — Progress Notes (Signed)
Subjective:   Patient ID: Kristi Montgomery, female   DOB: 88 y.o.   MRN: [id number]   HPI Patient presents stating both of her ankles have been bothering her and she has tried to reduce her activity and tried shoe gear modifications   ROS      Objective:  Physical Exam  Neurovascular status intact with patient found to have pain in the sinus tarsi of both feet with inflammation noted     Assessment:  Sinus tarsitis bilateral with inflammation     Plan:  H&P x-rays reviewed sterile prep injected the sinus tarsi bilateral 3 mg Kenalog 5 mg Xylocaine advised on reduced activity reappoint as needed

## 2021-10-15 ENCOUNTER — Ambulatory Visit: Payer: Medicare Other | Admitting: Student

## 2021-10-15 ENCOUNTER — Encounter: Payer: Self-pay | Admitting: Cardiology

## 2021-10-15 ENCOUNTER — Ambulatory Visit: Payer: Medicare Other | Admitting: Cardiology

## 2021-10-15 VITALS — BP 144/65 | HR 62 | Temp 98.4°F | Resp 16 | Ht 63.0 in | Wt 131.8 lb

## 2021-10-15 DIAGNOSIS — I6523 Occlusion and stenosis of bilateral carotid arteries: Secondary | ICD-10-CM

## 2021-10-15 DIAGNOSIS — I480 Paroxysmal atrial fibrillation: Secondary | ICD-10-CM

## 2021-10-15 DIAGNOSIS — I10 Essential (primary) hypertension: Secondary | ICD-10-CM

## 2021-10-15 MED ORDER — METOPROLOL SUCCINATE ER 25 MG PO TB24: 25.0000 mg | ORAL_TABLET | Freq: Every day | ORAL | 1 refills | Status: DC | PRN

## 2021-10-15 NOTE — Progress Notes (Unsigned)
ID:  MYRAH STRAWDERMAN, DOB 08/19/1930, MRN [id number]  PCP:  Shon Baton, MD  Cardiologist:  Rex Kras, DO, [facility] (established care 10/15/2021) Former Cardiology Providers: Dr. Quay Burow, Lawerance Cruel, PA   Chief Complaint  Patient presents with   Atrial Fibrillation   Follow-up    HPI  Kristi Montgomery is a 88 y.o. Caucasian female whose past medical history and cardiovascular risk factors include: Hypertension, chronic iron deficiency anemia, anxiety, GERD, former smoker, family history of premature CAD, history of COVID-19 infection, atrial fibrillation (diagnosed 09/2020).   Patient presents to the office for follow-up for atrial fibrillation.  Diagnosed with atrial fibrillation back in August 2022 and underwent cardioversion in October 2022. Since than has been treated w/ medical therapy and done well. Patient is currently on rate control strategy and on oral anticoagulation for thromboembolic prophylaxis.  She is here to reestablish care.  Clinically doing well and does not have any anginal discomfort or heart failure symptoms.  Does not endorse evidence of bleeding.   ALLERGIES: Allergies  Allergen Reactions   Amoxicillin Swelling and Other (See Comments)    Tongue swelling  Has patient had a PCN reaction causing immediate rash, facial/tongue/throat swelling, SOB or lightheadedness with hypotension: Yes Has patient had a PCN reaction causing severe rash involving mucus membranes or skin necrosis: Unknown Has patient had a PCN reaction that required hospitalization: Unknown Has patient had a PCN reaction occurring within the last 10 years: Unknown If all of the above answers are "NO", then may proceed with Cephalosporin use.    Amoxicillin-Pot Clavulanate Swelling and Other (See Comments)    Swollen and red tongue   Codeine Nausea And Vomiting   Latex Rash and Other (See Comments)    NO BAND-AIDS!!!!   Tape Rash and Other (See Comments)    NO BAND-AIDS OR TAPE!!!!     MEDICATION LIST PRIOR TO VISIT: Current Meds  Medication Sig   ALPRAZolam (XANAX) 0.25 MG tablet Take 0.125 mg by mouth 2 (two) times daily as needed for anxiety.   amiodarone (PACERONE) 200 MG tablet Take 0.5 tablets (100 mg total) by mouth daily.   amLODipine (NORVASC) 10 MG tablet Take 1 tablet (10 mg total) by mouth daily. (Patient taking differently: Take 10 mg by mouth at bedtime.)   apixaban (ELIQUIS) 2.5 MG TABS tablet Take 1 tablet (2.5 mg total) by mouth 2 (two) times daily.   Calcium Carb-Cholecalciferol (CALCIUM 600 + D PO) Take 1 tablet by mouth daily.   Carboxymethylcellulose Sodium 1 % GEL Place 1 application into both eyes at bedtime as needed (dry eyes).   Cholecalciferol (VITAMIN D3) 25 MCG (1000 UT) CAPS Take 1,000 Units by mouth daily at 12 noon.   Ferrous Sulfate Dried (SLOW RELEASE IRON) 45 MG TBCR Take 45 mg by mouth every other day.   furosemide (LASIX) 40 MG tablet Take 1 tablet (40 mg total) by mouth 2 (two) times daily as needed for fluid or edema. (Patient taking differently: Take 40 mg by mouth 2 (two) times daily.)   Multiple Vitamin (MULTIVITAMIN WITH MINERALS) TABS tablet Take 1 tablet by mouth daily.   omeprazole (PRILOSEC) 40 MG capsule Take 40 mg by mouth daily as needed (heartburn).   Polyethyl Glycol-Propyl Glycol (SYSTANE OP) Place 1 drop into both eyes daily as needed (dry eyes).   spironolactone (ALDACTONE) 25 MG tablet Take 0.5 tablets (12.5 mg total) by mouth daily.   zolpidem (AMBIEN) 10 MG tablet Take 5 mg by mouth  at bedtime.     PAST MEDICAL HISTORY: Past Medical History:  Diagnosis Date   Anxiety    Cancer ([place])    Melanoma   Cataract    Chest pain    myoview 04/01/12-normal   History of tobacco abuse    HTN (hypertension)    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/06/2017    PAST SURGICAL HISTORY: Past Surgical History:  Procedure Laterality Date   CARDIOVERSION N/A 12/04/2020   Procedure: CARDIOVERSION;  Surgeon: [name], MD;  Location: [facility]  ENDOSCOPY;  Service: Cardiovascular;  Laterality: N/A;   CHOLECYSTECTOMY     MELANOMA EXCISION     x5    FAMILY HISTORY: The patient family history includes Cancer in her brother, brother, sister, and sister; Congestive Heart Failure in her mother; Heart Problems in her brother; Heart attack in her father; Heart failure in her mother.  SOCIAL HISTORY:  The patient  reports that she quit smoking about 33 years ago. Her smoking use included cigarettes. She has never used smokeless tobacco. She reports that she does not drink alcohol and does not use drugs.  REVIEW OF SYSTEMS: Review of Systems  Cardiovascular:  Negative for chest pain, claudication, dyspnea on exertion, irregular heartbeat, leg swelling, near-syncope, orthopnea, palpitations, paroxysmal nocturnal dyspnea and syncope.  Respiratory:  Negative for shortness of breath.   Hematologic/Lymphatic: Negative for bleeding problem.  Musculoskeletal:  Negative for muscle cramps and myalgias.  Neurological:  Negative for dizziness and light-headedness.    PHYSICAL EXAM:    10/15/2021    9:57 AM 09/06/2021   11:27 AM 09/03/2021    2:19 PM  Vitals with BMI  Height '5\' 3"'$     Weight 131 lbs 13 oz 131 lbs   BMI 16.10 96.04   Systolic 540 981 191  Diastolic 65 54 63  Pulse 62 73 60    Physical Exam  Constitutional: No distress.  Age appropriate, hemodynamically stable.   Neck: No JVD present.  Cardiovascular: Normal rate, regular rhythm, S1 normal, S2 normal, intact distal pulses and normal pulses. Exam reveals no gallop, no S3 and no S4.  No murmur heard. Pulses:      Carotid pulses are  on the left side with bruit. Pulmonary/Chest: Effort normal and breath sounds normal. No stridor. She has no wheezes. She has no rales.  Abdominal: Soft. Bowel sounds are normal. She exhibits no distension. There is no abdominal tenderness.  Musculoskeletal:        General: No edema.     Cervical back: Neck supple.  Neurological: She is alert  and oriented to person, place, and time. She has intact cranial nerves (2-12).  Skin: Skin is warm and moist.   CARDIAC DATABASE: EKG: 10/15/2021: Sinus Bradycardia, 52bpm, nonspecific T-abnormality.  Echocardiogram: 02/28/2021: Normal LV systolic function with visual EF 60-65%. Left ventricle cavity is normal in size. Normal left ventricular wall thickness. Normal global wall motion. Doppler evidence of grade II (pseudonormal) diastolic dysfunction, elevated LAP. Moderate tricuspid regurgitation. Mild to moderate pulmonary hypertension. RVSP measures 44 mmHg. Compared to study 10/23/2020 Grade II DD and elevated LAP are new findings.   Stress Testing: 01/08/2021: Nondiagnostic ECG stress. Resting AF with RVR, no change with Lexiscan infusion. Myocardial perfusion is normal. Overall LV systolic function is normal without regional wall motion abnormalities. Stress LV EF: 42% although visually the LVEF is normal in size and function. No previous exam available for comparison. Low risk study.  Heart Catheterization: None  Carotid artery duplex 11/29/2020:  No evidence of significant  stenosis in the right carotid vessels.  Duplex suggests stenosis in the left internal carotid artery (16-49%).  Duplex suggests stenosis in the left external carotid artery (<50%).  Antegrade right vertebral artery flow. Antegrade left vertebral artery flow.  Follow up in one year is appropriate if clinically indicated.v   Direct current cardioversion 12/04/2020 9:51 AM Indication symptomatic A. Fibrillation. Procedure: Using 50 mg of IV Propofol and 60 IV Lidocaine (for reducing venous pain) for achieving deep sedation, synchronized direct current cardioversion performed. Patient was delivered with 120 Joules of electricity X 1 with success to NSR. Patient tolerated the procedure well. No immediate complication noted.   LABORATORY DATA:    Latest Ref Rng & Units 02/07/2021   12:11 PM 11/15/2020    5:21  PM 10/25/2020    2:57 AM  CBC  WBC 3.4 - 10.8 x10E3/uL 8.3  7.2  4.7   Hemoglobin 11.1 - 15.9 g/dL 12.3  11.4  10.6   Hematocrit 34.0 - 46.6 % 35.3  32.9  30.0   Platelets 150 - 450 x10E3/uL 293  250  293        Latest Ref Rng & Units 02/07/2021   12:11 PM 01/31/2021    1:17 PM 01/07/2021   10:07 AM  CMP  Glucose 70 - 99 mg/dL 133  133  187   BUN 8 - 27 mg/dL '17  11  12   '$ Creatinine 0.57 - 1.00 mg/dL 0.94  0.79  0.91   Sodium 134 - 144 mmol/L 136  138  134   Potassium 3.5 - 5.2 mmol/L 3.2  3.8  4.0   Chloride 96 - 106 mmol/L 90  99  95   CO2 20 - 29 mmol/L '27  25  21   '$ Calcium 8.7 - 10.3 mg/dL 9.8  9.7  9.4     Lipid Panel  No results found for: "CHOL", "TRIG", "HDL", "CHOLHDL", "VLDL", "LDLCALC", "LDLDIRECT", "LABVLDL"  No components found for: "NTPROBNP" Recent Labs    01/31/21 1316  PROBNP 2,029*   Recent Labs    10/23/20 1519  TSH 1.149    BMP Recent Labs    10/23/20 1519 10/25/20 0257 11/15/20 1721 01/07/21 1007 01/31/21 1317 02/07/21 1211  NA 136 136 134* 134 138 136  K 3.3* 3.9 4.0 4.0 3.8 3.2*  CL 106 104 101 95* 99 90*  CO2 '24 26 25 21 25 27  '$ GLUCOSE 162* 139* 186* 187* 133* 133*  BUN 6* '8 13 12 11 17  '$ CREATININE 0.65 0.71 0.85 0.91 0.79 0.94  CALCIUM 8.7* 8.9 9.0 9.4 9.7 9.8  GFRNONAA >60 >60 >60  --   --   --     HEMOGLOBIN A1C Lab Results  Component Value Date   HGBA1C 5.6 10/23/2020   MPG 114 10/23/2020    IMPRESSION:    ICD-10-CM   1. Paroxysmal A-fib (HCC)  I48.0 EKG 12-Lead    metoprolol succinate (TOPROL-XL) 25 MG 24 hr tablet    Basic metabolic panel    Magnesium    Hemoglobin and hematocrit, blood    2. Essential hypertension  I10     3. Bilateral carotid artery stenosis  I65.23        RECOMMENDATIONS: Kristi Montgomery is a 88 y.o. Caucasian female whose past medical history and cardiac risk factors include: Hypertension, chronic iron deficiency anemia, anxiety, GERD, former smoker, family history of premature CAD,  history of COVID-19 infection, paroxysmal atrial fibrillation (diagnosed 09/2020).   Paroxysmal A-fib ([place])  Rate control: Metoprolol. Rhythm control: Amiodarone Thromboembolic prophylaxis: Eliquis. CHA2DS2-VASc SCORE is 4 which correlates to approximately 4.8% risk of stroke per year (HTN, age, gender).  History of cardioversion x1. EKG shows sinus bradycardia without underlying injury pattern. Risks, benefits, and alternatives to oral anticoagulation discussed. We will check hemoglobin and BMP prior to next office visit as she is on blood thinners.  Essential hypertension Office blood pressures are within acceptable limits. Medications reconciled  Bilateral carotid artery stenosis Currently asymptomatic. Scheduled for carotid duplex in October 2023.  FINAL MEDICATION LIST END OF ENCOUNTER: Meds ordered this encounter  Medications   metoprolol succinate (TOPROL-XL) 25 MG 24 hr tablet    Sig: Take 1 tablet (25 mg total) by mouth daily as needed (Palpitations/elevated heart rate and). Hold if systolic blood pressure (top number) less than 100 mmHg or pulse less than 60 bpm.    Dispense:  90 tablet    Refill:  1    Medications Discontinued During This Encounter  Medication Reason   Olopatadine HCl (PATADAY OP)    metoprolol succinate (TOPROL-XL) 25 MG 24 hr tablet      Current Outpatient Medications:    ALPRAZolam (XANAX) 0.25 MG tablet, Take 0.125 mg by mouth 2 (two) times daily as needed for anxiety., Disp: , Rfl:    amiodarone (PACERONE) 200 MG tablet, Take 0.5 tablets (100 mg total) by mouth daily., Disp: 15 tablet, Rfl: 3   amLODipine (NORVASC) 10 MG tablet, Take 1 tablet (10 mg total) by mouth daily. (Patient taking differently: Take 10 mg by mouth at bedtime.), Disp: 90 tablet, Rfl: 3   apixaban (ELIQUIS) 2.5 MG TABS tablet, Take 1 tablet (2.5 mg total) by mouth 2 (two) times daily., Disp: 60 tablet, Rfl: 0   Calcium Carb-Cholecalciferol (CALCIUM 600 + D PO), Take 1 tablet  by mouth daily., Disp: , Rfl:    Carboxymethylcellulose Sodium 1 % GEL, Place 1 application into both eyes at bedtime as needed (dry eyes)., Disp: , Rfl:    Cholecalciferol (VITAMIN D3) 25 MCG (1000 UT) CAPS, Take 1,000 Units by mouth daily at 12 noon., Disp: , Rfl:    Ferrous Sulfate Dried (SLOW RELEASE IRON) 45 MG TBCR, Take 45 mg by mouth every other day., Disp: , Rfl:    furosemide (LASIX) 40 MG tablet, Take 1 tablet (40 mg total) by mouth 2 (two) times daily as needed for fluid or edema. (Patient taking differently: Take 40 mg by mouth 2 (two) times daily.), Disp: 60 tablet, Rfl: 3   Multiple Vitamin (MULTIVITAMIN WITH MINERALS) TABS tablet, Take 1 tablet by mouth daily., Disp: , Rfl:    omeprazole (PRILOSEC) 40 MG capsule, Take 40 mg by mouth daily as needed (heartburn)., Disp: , Rfl:    Polyethyl Glycol-Propyl Glycol (SYSTANE OP), Place 1 drop into both eyes daily as needed (dry eyes)., Disp: , Rfl:    spironolactone (ALDACTONE) 25 MG tablet, Take 0.5 tablets (12.5 mg total) by mouth daily., Disp: 90 tablet, Rfl: 0   zolpidem (AMBIEN) 10 MG tablet, Take 5 mg by mouth at bedtime., Disp: , Rfl:    metoprolol succinate (TOPROL-XL) 25 MG 24 hr tablet, Take 1 tablet (25 mg total) by mouth daily as needed (Palpitations/elevated heart rate and). Hold if systolic blood pressure (top number) less than 100 mmHg or pulse less than 60 bpm., Disp: 90 tablet, Rfl: 1   pentoxifylline (TRENTAL) 400 MG CR tablet, Take 400 mg by mouth 3 (three) times daily with meals., Disp: , Rfl:  Orders Placed This Encounter  Procedures   Basic metabolic panel   Magnesium   Hemoglobin and hematocrit, blood   EKG 12-Lead    There are no Patient Instructions on file for this visit.   --Continue cardiac medications as reconciled in final medication list. --Return in about 6 months (around 04/17/2022) for Follow up, A. fib. or sooner if needed. --Continue follow-up with your primary care physician regarding the  management of your other chronic comorbid conditions.  Patient's questions and concerns were addressed to her satisfaction. She voices understanding of the instructions provided during this encounter.   This note was created using a voice recognition software as a result there may be grammatical errors inadvertently enclosed that do not reflect the nature of this encounter. Every attempt is made to correct such errors.  Rex Kras, [place], [facility]  Pager: [phone] Office: [phone]

## 2021-11-18 ENCOUNTER — Ambulatory Visit: Payer: Medicare Other | Admitting: Podiatry

## 2021-11-18 ENCOUNTER — Encounter: Payer: Self-pay | Admitting: Podiatry

## 2021-11-18 DIAGNOSIS — G629 Polyneuropathy, unspecified: Secondary | ICD-10-CM

## 2021-11-18 DIAGNOSIS — L84 Corns and callosities: Secondary | ICD-10-CM | POA: Diagnosis not present

## 2021-11-18 DIAGNOSIS — M2042 Other hammer toe(s) (acquired), left foot: Secondary | ICD-10-CM

## 2021-11-18 NOTE — Progress Notes (Signed)
Subjective:   Patient ID: Kristi Montgomery, female   DOB: 88 y.o.   MRN: [id number]   HPI Patient presents stating she gets strange pains in both her feet and they can be numbing in the digits seem to quiver on their own and she is concerned about her nail being sore on the left big toe and mild inflammation.  Patient does not smoke likes to be active.  Also complaining of lesions   ROS      Objective:  Physical Exam  Neurovascular status diminished but intact in both feet complaints of neuropathic-like symptoms but I was unable to identify with keratotic lesion digit 5 right digit 1 right and digit 5 left.  Patient has other complaints but very difficult to quantitate what they are and I was not able to identify exactly what she is referring to     Assessment:  Inflammatory condition and probability for neuropathy that is very difficult for [place] to get better along with a ingrown toenail left hallux eft H&P discussed all the different conditions and I do not see a good treatment course for her currently even though I offered to remove a portion of the left hallux nail bed to give her relief but she denies wanting this.  Unfortunately I do not think I have anything else that would be of great benefit for her     Plan:  Plan as listed above

## 2021-11-19 DIAGNOSIS — H93293 Other abnormal auditory perceptions, bilateral: Secondary | ICD-10-CM | POA: Insufficient documentation

## 2021-11-21 ENCOUNTER — Telehealth: Payer: Self-pay | Admitting: *Deleted

## 2021-11-21 NOTE — Telephone Encounter (Signed)
We can write her a compounding formula if she would like

## 2021-11-21 NOTE — Telephone Encounter (Signed)
Patient is calling to ask if there are any neuropathy creams available OTC,or prescribed that she can use?  Please advise.

## 2021-12-09 ENCOUNTER — Telehealth: Payer: Self-pay | Admitting: *Deleted

## 2021-12-09 NOTE — Telephone Encounter (Signed)
Patient is calling  for a status of a neuropathy cream that was supposed to be sent to pharmacy. Please advise.

## 2021-12-11 NOTE — Telephone Encounter (Signed)
Not sent to pharmacy

## 2021-12-11 NOTE — Telephone Encounter (Signed)
Check that we sent in prescription

## 2021-12-12 NOTE — Telephone Encounter (Signed)
I'm not sure what prescription she wants. I don't think there is anything I can do to help her

## 2021-12-16 ENCOUNTER — Ambulatory Visit: Payer: Medicare Other

## 2021-12-16 DIAGNOSIS — I6523 Occlusion and stenosis of bilateral carotid arteries: Secondary | ICD-10-CM

## 2021-12-17 ENCOUNTER — Other Ambulatory Visit: Payer: Self-pay | Admitting: Internal Medicine

## 2021-12-17 DIAGNOSIS — Z1231 Encounter for screening mammogram for malignant neoplasm of breast: Secondary | ICD-10-CM

## 2021-12-19 NOTE — Telephone Encounter (Signed)
Called patient to give information,no answer, left message for call back.

## 2022-01-31 ENCOUNTER — Other Ambulatory Visit: Payer: Self-pay | Admitting: Gastroenterology

## 2022-01-31 ENCOUNTER — Ambulatory Visit
Admission: RE | Admit: 2022-01-31 | Discharge: 2022-01-31 | Disposition: A | Discharge status: Home / Self Care | Payer: Medicare Other | Source: Ambulatory Visit | Attending: Gastroenterology | Admitting: Gastroenterology

## 2022-01-31 DIAGNOSIS — R197 Diarrhea, unspecified: Secondary | ICD-10-CM

## 2022-02-11 ENCOUNTER — Ambulatory Visit: Payer: Medicare Other

## 2022-02-26 ENCOUNTER — Ambulatory Visit
Admission: EM | Admit: 2022-02-26 | Discharge: 2022-02-26 | Disposition: A | Discharge status: Home / Self Care | Payer: Medicare Other | Attending: Physician Assistant | Admitting: Physician Assistant

## 2022-02-26 DIAGNOSIS — M1611 Unilateral primary osteoarthritis, right hip: Secondary | ICD-10-CM | POA: Diagnosis not present

## 2022-02-26 DIAGNOSIS — M25551 Pain in right hip: Secondary | ICD-10-CM

## 2022-02-26 MED ORDER — LIDOCAINE 4 % EX PTCH: 1.0000 | MEDICATED_PATCH | CUTANEOUS | 0 refills | Status: DC

## 2022-02-26 MED ORDER — TIZANIDINE HCL 4 MG PO TABS: 4.0000 mg | ORAL_TABLET | Freq: Four times a day (QID) | ORAL | 0 refills | Status: DC | PRN

## 2022-02-26 NOTE — Discharge Instructions (Signed)
Advised to use ice therapy alternating with heat, 10 minutes on 20 minutes off, 3-4 times throughout the evening to help reduce pain. Advised to apply the lidocaine patch to the area once every 24 hours to help relieve pain and discomfort. Advised take Zanaflex 1 tablet every 6-8 hours over the next couple days to help reduce muscle irritability. Advised follow-up PCP or return to urgent care as needed.

## 2022-02-26 NOTE — ED Triage Notes (Signed)
Pt states that she has some right hip pain. X3 days

## 2022-02-26 NOTE — ED Provider Notes (Signed)
[facility]    CSN: [id number] Arrival date & time: 02/26/22  1231      History   Chief Complaint Chief Complaint  Patient presents with   Hip Pain    Right hip pain. X3 days    HPI Kristi Montgomery is a 89 y.o. female.   25-year-old female presents with right hip pain.  Patient indicates for the past 3 days she has been having some posterior right hip pain and discomfort.  She relates the pain is localized, worse when she gets up, bends or sits.  Patient indicates the pain started after she was lifting some objects at home.  Patient relates she did not hit the hip, following the hip, or injury to hip that she knows of.  Patient denies any numbness, tingling, weakness of the right leg.  Patient indicates she has been taking Tylenol without relief of her symptoms.  Patient denies any rash on the skin at the hip area.   Hip Pain    Past Medical History:  Diagnosis Date   Anxiety    Cancer [facility])    Melanoma   Cataract    Chest pain    myoview 04/01/12-normal   History of tobacco abuse    HTN (hypertension)    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/06/2017    Patient Active Problem List   Diagnosis Date Noted   Paroxysmal A-fib ([place]) 01/13/2021   Atrial fibrillation with rapid ventricular response ([place]) 10/23/2020   Persistent atrial fibrillation ([place]) 10/22/2020   Bilateral temporomandibular joint pain 09/24/2017   Presbycusis of both ears 09/24/2017   Temporal arteritis ([place]) 09/24/2017   Blepharospasm 09/15/2017   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/06/2017   Bilateral dry eyes 07/13/2014   Essential hypertension 07/29/2012   Choroidal nevus 07/10/2011   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   Nuclear cataract 07/10/2011   CHEST PAIN UNSPECIFIED 01/25/2009   DYSPHAGIA UNSPECIFIED 01/25/2009    Past Surgical History:  Procedure Laterality Date   CARDIOVERSION N/A 12/04/2020   Procedure:  CARDIOVERSION;  Surgeon: [name], MD;  Location: [facility];  Service: Cardiovascular;  Laterality: N/A;   CHOLECYSTECTOMY     MELANOMA EXCISION     x5    OB History     Gravida      Para      Term      Preterm      AB      Living  2      SAB      IAB      Ectopic      Multiple      Live Births               Home Medications    Prior to Admission medications   Medication Sig Start Date End Date Taking? Authorizing Provider  ALPRAZolam (XANAX) 0.25 MG tablet Take 0.125 mg by mouth 2 (two) times daily as needed for anxiety.   Yes [provider]  amiodarone (PACERONE) 200 MG tablet Take 0.5 tablets (100 mg total) by mouth daily. 08/15/21  Yes Cantwell, Celeste C, PA-C  apixaban (ELIQUIS) 2.5 MG TABS tablet Take 1 tablet (2.5 mg total) by mouth 2 (two) times daily. 10/25/20  Yes Domenic Polite, MD  Calcium Carb-Cholecalciferol (CALCIUM 600 + D PO) Take 1 tablet by mouth daily.   Yes [provider]  Carboxymethylcellulose Sodium 1 % GEL Place 1 application into both eyes at bedtime as  needed (dry eyes).   Yes [provider]  Cholecalciferol (VITAMIN D3) 25 MCG (1000 UT) CAPS Take 1,000 Units by mouth daily at 12 noon.   Yes [provider]  Ferrous Sulfate Dried (SLOW RELEASE IRON) 45 MG TBCR Take 45 mg by mouth every other day.   Yes [provider]  lidocaine (HM LIDOCAINE PATCH) 4 % Place 1 patch onto the skin daily. 02/26/22  Yes Nyoka Lint, PA-C  metoprolol succinate (TOPROL-XL) 25 MG 24 hr tablet Take 1 tablet (25 mg total) by mouth daily as needed (Palpitations/elevated heart rate and). Hold if systolic blood pressure (top number) less than 100 mmHg or pulse less than 60 bpm. 10/15/21 04/13/22 Yes Tolia, Sunit, DO  Multiple Vitamin (MULTIVITAMIN WITH MINERALS) TABS tablet Take 1 tablet by mouth daily.   Yes [provider]  omeprazole (PRILOSEC) 40 MG capsule Take 40 mg by mouth daily as needed  (heartburn).   Yes [provider]  pentoxifylline (TRENTAL) 400 MG CR tablet Take 400 mg by mouth 3 (three) times daily with meals.   Yes [provider]  Polyethyl Glycol-Propyl Glycol (SYSTANE OP) Place 1 drop into both eyes daily as needed (dry eyes).   Yes [provider]  spironolactone (ALDACTONE) 25 MG tablet Take 0.5 tablets (12.5 mg total) by mouth daily. 05/13/21  Yes Cantwell, Celeste C, PA-C  tiZANidine (ZANAFLEX) 4 MG tablet Take 1 tablet (4 mg total) by mouth every 6 (six) hours as needed for muscle spasms. 02/26/22  Yes Nyoka Lint, PA-C  zolpidem (AMBIEN) 10 MG tablet Take 5 mg by mouth at bedtime. 09/18/20  Yes [provider]  amLODipine (NORVASC) 10 MG tablet Take 1 tablet (10 mg total) by mouth daily. Patient taking differently: Take 10 mg by mouth at bedtime. 12/18/20 12/13/21  Cantwell, Celeste C, PA-C  furosemide (LASIX) 40 MG tablet Take 1 tablet (40 mg total) by mouth 2 (two) times daily as needed for fluid or edema. Patient taking differently: Take 40 mg by mouth 2 (two) times daily. 02/07/21 10/15/21  Cantwell, Gerline Legacy, PA-C    Family History Family History  Problem Relation Age of Onset   Congestive Heart Failure Mother    Heart failure Mother    Heart attack Father        age 23   Heart Problems Brother    Cancer Brother    Cancer Brother        pancreatic    Cancer Sister        breast    Cancer Sister        breast   Breast cancer Neg Hx     Social History Social History   Tobacco Use   Smoking status: Former    Types: Cigarettes    Quit date: 02/25/1988    Years since quitting: 34.0   Smokeless tobacco: Never  Vaping Use   Vaping Use: Never used  Substance Use Topics   Alcohol use: No   Drug use: No     Allergies   Amoxicillin, Amoxicillin-pot clavulanate, Codeine, Latex, and Tape   Review of Systems Review of Systems  Musculoskeletal:  Positive for gait problem (right hip pain).     Physical  Exam Triage Vital Signs ED Triage Vitals  Enc Vitals Group     BP 02/26/22 1329 (!) 145/70     Pulse Rate 02/26/22 1329 (!) 51     Resp 02/26/22 1329 16     Temp 02/26/22 1329 97.9  F (36.6 C)     Temp Source 02/26/22 1329 Oral     SpO2 02/26/22 1329 97 %     Weight 02/26/22 1328 130 lb (59 kg)     Height 02/26/22 1328 '5\' 3"'$  (1.6 m)     Head Circumference --      Peak Flow --      Pain Score 02/26/22 1328 5     Pain Loc --      Pain Edu? --      Excl. in [place]? --    No data found.  Updated Vital Signs BP (!) 145/70   Pulse (!) 51   Temp 97.9 F (36.6 C) (Oral)   Resp 16   Ht '5\' 3"'$  (1.6 m)   Wt 130 lb (59 kg)   SpO2 97%   BMI 23.03 kg/m   Visual Acuity Right Eye Distance:   Left Eye Distance:   Bilateral Distance:    Right Eye Near:   Left Eye Near:    Bilateral Near:     Physical Exam Constitutional:      Appearance: Normal appearance.  Musculoskeletal:       Legs:     Comments: Right hip: Pain is palpated along the lateral and posterior hip area, pain increases with internal and external rotation of the hip.  No unusual rashes of the skin, no redness or swelling.  Range of motion is normal without crepitus occurring on motion.  Neurological:     Mental Status: She is alert.      UC Treatments / Results  Labs (all labs ordered are listed, but only abnormal results are displayed) Labs Reviewed - No data to display  EKG   Radiology No results found.  Procedures Procedures (including critical care time)  Medications Ordered in UC Medications - No data to display  Initial Impression / Assessment and Plan / UC Course  I have reviewed the triage vital signs and the nursing notes.  Pertinent labs & imaging results that were available during my care of the patient were reviewed by me and considered in my medical decision making (see chart for details).    Plan: 1.  The right hip pain will be treated with the following: A.  Advised to apply  lidocaine patch every 24 hours to the hip pain for 3 days only. B.  Advised alternating heat and ice for pain and discomfort. C.  Advised Zanaflex 4 mg every 8 hours for 3 days only to help reduce the muscle spasm and irritability. 2.  The arthritis of the right hip will be treated with the following: A.  Lidocaine patch every 24 hours for 3 days only to reduce pain and discomfort. 3.  Patient advised follow-up PCP or return to urgent care as needed. Final Clinical Impressions(s) / UC Diagnoses   Final diagnoses:  Right hip pain  Arthritis of right hip     Discharge Instructions      Advised to use ice therapy alternating with heat, 10 minutes on 20 minutes off, 3-4 times throughout the evening to help reduce pain. Advised to apply the lidocaine patch to the area once every 24 hours to help relieve pain and discomfort. Advised take Zanaflex 1 tablet every 6-8 hours over the next couple days to help reduce muscle irritability. Advised follow-up PCP or return to urgent care as needed.    ED Prescriptions     Medication Sig Dispense Auth. Provider   tiZANidine (ZANAFLEX) 4 MG tablet Take  1 tablet (4 mg total) by mouth every 6 (six) hours as needed for muscle spasms. 16 tablet Nyoka Lint, PA-C   lidocaine (HM LIDOCAINE PATCH) 4 % Place 1 patch onto the skin daily. 3 patch Nyoka Lint, PA-C      I have reviewed the PDMP during this encounter.   Nyoka Lint, PA-C 02/26/22 1359

## 2022-03-03 ENCOUNTER — Telehealth: Payer: Self-pay

## 2022-03-03 NOTE — Telephone Encounter (Signed)
Patient called asking if she can take Methylprednisolone 4 mg due that it was prescribe by her ortho today. But the her ortho doctor mention to her that her pulse could get irregular and she could get a-fib. Patient would like for you to advise if she can start it. They gave her Methylprednisolone for her back pain.

## 2022-03-03 NOTE — Telephone Encounter (Signed)
She can take it and monitor her symptoms.  Its possible but not always.   Dr. Terri Skains

## 2022-03-04 ENCOUNTER — Other Ambulatory Visit: Payer: Self-pay

## 2022-03-04 DIAGNOSIS — I480 Paroxysmal atrial fibrillation: Secondary | ICD-10-CM

## 2022-03-04 DIAGNOSIS — I10 Essential (primary) hypertension: Secondary | ICD-10-CM

## 2022-03-04 MED ORDER — SPIRONOLACTONE 25 MG PO TABS: 12.5000 mg | ORAL_TABLET | Freq: Every day | ORAL | 0 refills | Status: DC

## 2022-03-04 NOTE — Telephone Encounter (Signed)
She will I gave her a 30 days supply and I ordered her lab work

## 2022-03-04 NOTE — Telephone Encounter (Signed)
Is she starting aldactone? If yes - did you order and release BMP?  Dr. Terri Skains

## 2022-03-04 NOTE — Telephone Encounter (Signed)
Called patient to inform her about the message above. Patient understood, and will call us if she has any symptoms.

## 2022-03-13 ENCOUNTER — Ambulatory Visit: Payer: Medicare Other

## 2022-03-14 DIAGNOSIS — M47816 Spondylosis without myelopathy or radiculopathy, lumbar region: Secondary | ICD-10-CM | POA: Insufficient documentation

## 2022-03-14 DIAGNOSIS — M5416 Radiculopathy, lumbar region: Secondary | ICD-10-CM | POA: Insufficient documentation

## 2022-04-11 ENCOUNTER — Ambulatory Visit: Payer: Medicare Other

## 2022-04-15 ENCOUNTER — Ambulatory Visit: Payer: Medicare Other | Admitting: Cardiology

## 2022-04-15 ENCOUNTER — Encounter: Payer: Self-pay | Admitting: Cardiology

## 2022-04-15 VITALS — BP 145/58 | HR 74 | Resp 16 | Ht 63.0 in | Wt 129.0 lb

## 2022-04-15 DIAGNOSIS — I6522 Occlusion and stenosis of left carotid artery: Secondary | ICD-10-CM

## 2022-04-15 DIAGNOSIS — Z7901 Long term (current) use of anticoagulants: Secondary | ICD-10-CM

## 2022-04-15 DIAGNOSIS — I480 Paroxysmal atrial fibrillation: Secondary | ICD-10-CM

## 2022-04-15 DIAGNOSIS — Z79899 Other long term (current) drug therapy: Secondary | ICD-10-CM

## 2022-04-15 DIAGNOSIS — I10 Essential (primary) hypertension: Secondary | ICD-10-CM

## 2022-04-15 MED ORDER — MULTAQ 400 MG PO TABS: 400.0000 mg | ORAL_TABLET | Freq: Two times a day (BID) | ORAL | 0 refills | Status: DC

## 2022-04-15 NOTE — Progress Notes (Signed)
ID:  Kristi Montgomery, DOB [birth year], MRN [id number]  PCP:  Shon Baton, MD  Cardiologist:  Rex Kras, DO, [facility] (established care 10/15/2021) Former Cardiology Providers: Dr. Quay Burow, Lawerance Cruel, [place]  Date: 04/15/22 Last Office Visit: 10/15/2021  Chief Complaint  Patient presents with   Atrial Fibrillation   Follow-up    6 month    HPI  Kristi Montgomery is a 89 y.o. Caucasian female whose past medical history and cardiovascular risk factors include: Hypertension, chronic iron deficiency anemia, anxiety, GERD, former smoker, family history of premature CAD, history of COVID-19 infection, atrial fibrillation (diagnosed 09/2020).   Patient presents today for 58-monthfollow-up visit for atrial fibrillation management.  She was diagnosed back in August 2022 and underwent cardioversion in October 2022.  Since then she has been on medical therapy.  She does not endorse evidence of bleeding.  ALLERGIES: Allergies  Allergen Reactions   Amoxicillin Swelling and Other (See Comments)    Tongue swelling  Has patient had a PCN reaction causing immediate rash, facial/tongue/throat swelling, SOB or lightheadedness with hypotension: Yes Has patient had a PCN reaction causing severe rash involving mucus membranes or skin necrosis: Unknown Has patient had a PCN reaction that required hospitalization: Unknown Has patient had a PCN reaction occurring within the last 10 years: Unknown If all of the above answers are "NO", then may proceed with Cephalosporin use.    Amoxicillin-Pot Clavulanate Swelling and Other (See Comments)    Swollen and red tongue   Codeine Nausea And Vomiting   Latex Rash and Other (See Comments)    NO BAND-AIDS!!!!   Tape Rash and Other (See Comments)    NO BAND-AIDS OR TAPE!!!!    MEDICATION LIST PRIOR TO VISIT: Current Meds  Medication Sig   ALPRAZolam (XANAX) 0.25 MG tablet Take 0.125 mg by mouth 2 (two) times daily as needed for anxiety.   amLODipine  (NORVASC) 10 MG tablet Take 1 tablet (10 mg total) by mouth daily. (Patient taking differently: Take 10 mg by mouth at bedtime.)   apixaban (ELIQUIS) 2.5 MG TABS tablet Take 1 tablet (2.5 mg total) by mouth 2 (two) times daily.   cyclobenzaprine (FLEXERIL) 5 MG tablet Take 5 mg by mouth at bedtime.   donepezil (ARICEPT) 5 MG tablet Take 1 tablet by mouth 2 (two) times daily.   dronedarone (MULTAQ) 400 MG tablet Take 1 tablet (400 mg total) by mouth 2 (two) times daily with a meal.   furosemide (LASIX) 40 MG tablet Take 1 tablet (40 mg total) by mouth 2 (two) times daily as needed for fluid or edema. (Patient taking differently: Take 40 mg by mouth 2 (two) times daily.)   metoprolol succinate (TOPROL-XL) 25 MG 24 hr tablet Take 1 tablet (25 mg total) by mouth daily as needed (Palpitations/elevated heart rate and). Hold if systolic blood pressure (top number) less than 100 mmHg or pulse less than 60 bpm.   omeprazole (PRILOSEC) 40 MG capsule Take 40 mg by mouth daily as needed (heartburn).   Polyethyl Glycol-Propyl Glycol (SYSTANE OP) Place 1 drop into both eyes daily as needed (dry eyes).   spironolactone (ALDACTONE) 25 MG tablet Take 0.5 tablets (12.5 mg total) by mouth daily.   traMADol (ULTRAM) 50 MG tablet Take 50 mg by mouth every 6 (six) hours as needed.   zolpidem (AMBIEN) 10 MG tablet Take 5 mg by mouth at bedtime.   [DISCONTINUED] amiodarone (PACERONE) 200 MG tablet Take 0.5 tablets (100 mg total) by mouth  daily.     PAST MEDICAL HISTORY: Past Medical History:  Diagnosis Date   Anxiety    Cancer ([place])    Melanoma   Cataract    Chest pain    myoview 04/01/12-normal   History of tobacco abuse    HTN (hypertension)    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/06/2017    PAST SURGICAL HISTORY: Past Surgical History:  Procedure Laterality Date   CARDIOVERSION N/A 12/04/2020   Procedure: CARDIOVERSION;  Surgeon: [name], MD;  Location: [facility] ENDOSCOPY;  Service: Cardiovascular;  Laterality: N/A;    CHOLECYSTECTOMY     MELANOMA EXCISION     x5    FAMILY HISTORY: The patient family history includes Cancer in her brother, brother, sister, and sister; Congestive Heart Failure in her mother; Heart Problems in her brother; Heart attack in her father; Heart failure in her mother.  SOCIAL HISTORY:  The patient  reports that she quit smoking about 34 years ago. Her smoking use included cigarettes. She has never used smokeless tobacco. She reports that she does not drink alcohol and does not use drugs.  REVIEW OF SYSTEMS: Review of Systems  Cardiovascular:  Negative for chest pain, claudication, dyspnea on exertion, irregular heartbeat, leg swelling, near-syncope, orthopnea, palpitations, paroxysmal nocturnal dyspnea and syncope.  Respiratory:  Negative for shortness of breath.   Hematologic/Lymphatic: Negative for bleeding problem.  Musculoskeletal:  Negative for muscle cramps and myalgias.  Neurological:  Negative for dizziness and light-headedness.    PHYSICAL EXAM:    04/15/2022    2:58 PM 02/26/2022    1:29 PM 02/26/2022    1:28 PM  Vitals with BMI  Height '5\' 3"'$   '5\' 3"'$   Weight 129 lbs  130 lbs  BMI [id number]  0000000  Systolic Q000111Q Q000111Q   Diastolic 58 70   Pulse 74 51     Physical Exam  Constitutional: No distress.  Age appropriate, hemodynamically stable.   Neck: No JVD present.  Cardiovascular: Normal rate, regular rhythm, S1 normal, S2 normal, intact distal pulses and normal pulses. Exam reveals no gallop, no S3 and no S4.  No murmur heard. Pulses:      Carotid pulses are  on the left side with bruit. Pulmonary/Chest: Effort normal and breath sounds normal. No stridor. She has no wheezes. She has no rales.  Abdominal: Soft. Bowel sounds are normal. She exhibits no distension. There is no abdominal tenderness.  Musculoskeletal:        General: No edema.     Cervical back: Neck supple.  Neurological: She is alert and oriented to person, place, and time. She has intact cranial  nerves (2-12).  Skin: Skin is warm and moist.   CARDIAC DATABASE: EKG: 04/15/2022: Sinus rhythm, 73 bpm, without underlying injury pattern.  Echocardiogram: 02/28/2021: Normal LV systolic function with visual EF 60-65%. Left ventricle cavity is normal in size. Normal left ventricular wall thickness. Normal global wall motion. Doppler evidence of grade II (pseudonormal) diastolic dysfunction, elevated LAP. Moderate tricuspid regurgitation. Mild to moderate pulmonary hypertension. RVSP measures 44 mmHg. Compared to study 10/23/2020 Grade II DD and elevated LAP are new findings.   Stress Testing: 01/08/2021: Nondiagnostic ECG stress. Resting AF with RVR, no change with Lexiscan infusion. Myocardial perfusion is normal. Overall LV systolic function is normal without regional wall motion abnormalities. Stress LV EF: 42% although visually the LVEF is normal in size and function. No previous exam available for comparison. Low risk study.  Heart Catheterization: None  Carotid artery duplex 12/16/2021:  No evidence  of significant stenosis in the right carotid vessels.  Duplex suggests stenosis in the left internal carotid artery (1-15%). Mild homogeneous plaque noted.  Antegrade left vertebral artery flow.  Compared to 11/29/2020, left ICA stenosis of 50 to 49% not present.    Direct current cardioversion 12/04/2020 9:51 AM Indication symptomatic A. Fibrillation. Procedure: Using 50 mg of IV Propofol and 60 IV Lidocaine (for reducing venous pain) for achieving deep sedation, synchronized direct current cardioversion performed. Patient was delivered with 120 Joules of electricity X 1 with success to NSR. Patient tolerated the procedure well. No immediate complication noted.   LABORATORY DATA: Results Component Value Reference Range Notes  COMPLETE METABOLIC Reviewed [id number] 05:06:16 PM Interpretation: Performing Lab: Notes/Report:  GLUCOSE 124 60 - 110 mg/dl    BUN 14 5 - 23  mg/dl    CREATININE 0.8 0.3 - 1.5 mg/dl    eGFR Non-African American 67.7 <    eGFR African American 81.9 <    SODIUM 133 135 - 148 mEq/L    POTASSIUM 4.1 3.5 - 5.3 mEq/L    CHLORIDE 99 80 - 111 mEq/L    CO2 24 15 - 35 mEq/L    CALCIUM 8.0 7.0 - 10.5 mg/dL    TOTAL PROTEIN 6.5 6.0 - 8.5 g/dL    ALBUMIN 4.0 2.0 - 5.5 g/dL    AST 17 7 - 45 IU/L    ALT 16 5 - 40 IU/L    ALK PHOS 63 37 - 137 IU/L    TOTAL BILIRUBIN 1.0 0.0 - 1.5 mg/dl    CBC Reviewed date:08/02/2021 05:06:16 PM Interpretation: Performing Lab: Notes/Report:  WBC 5.41 4.10 - 10.90 K/uL    LYM 1.5 0.6 - 4.1 K/uL    BASO% 2.0 0.0 - 2.0    EOS 0.1 0.0 - 0.4    BASO 0.1 0.0 - 0.2    EOS% 1.5 0.0 - 7.8    LYM% 27.7 10.0 - 58.5 %    RBC 2.9 4.2 - 6.3 M/uL    HGB 11.5 12.0 - 18.0 g/dL    HCT 31.5 37.0 - 51.0 %    MCV 108.5 80.0 - 97.0 fL    MCH 39.4 26.0 - 32.0 pg    MCHC 36.4 31.0 - 36.0 g/dL    RDW 15.5 12.3 - 15.4    PLT 221 140 - 440 K/uL    MPV 8.4 6.9 - 10.6    NEU 3.1 1.4 - 7.0    MONO% 11.3 4.6 - 12.4    MONO 0.6 0.1 - 0.9    NEU% 57.6 43.3 - 71.9    LIPID Reviewed date:08/02/2021 05:06:16 PM Interpretation: Performing Lab: Notes/Report:  CHOLESTEROL 146 100 - 200 mg/dl    TRIGLYCERIDES 54 30 - 149 mg/dl    HDL 52 40 - 60 mg/dl    LDL 83 < mg/dl    CHOL/HDL RATIO 2.8 < RATIO    LDL/HDL RATIO 1.6 1.7 - 2.5    NON-HDL 94.0 <    TSH Reviewed date:08/02/2021 05:06:16 PM Interpretation: Performing Lab: Notes/Report:  TSH 3.66 0.40 - 4.20 uIU/ml Third Generation hsTSH     IMPRESSION:    ICD-10-CM   1. Paroxysmal A-fib (HCC)  I48.0 EKG 12-Lead    dronedarone (MULTAQ) 400 MG tablet    2. Long term (current) use of anticoagulants  Z79.01 Hemoglobin and hematocrit, blood    Basic metabolic panel    3. Long term current use of antiarrhythmic drug  Z79.899 Hemoglobin and  hematocrit, blood    Basic metabolic panel    4. Essential hypertension  I10     5. Atherosclerosis of left carotid artery   I65.22        RECOMMENDATIONS: Kristi Montgomery is a 89 y.o. Caucasian female whose past medical history and cardiac risk factors include: Hypertension, chronic iron deficiency anemia, anxiety, GERD, former smoker, family history of premature CAD, history of COVID-19 infection, paroxysmal atrial fibrillation (diagnosed 09/2020).   Paroxysmal A-fib (HCC) Rate control: Metoprolol. Rhythm control: Multaq Thromboembolic prophylaxis: Eliquis. CHA2DS2-VASc SCORE is 4 which correlates to approximately 4.8% risk of stroke per year (HTN, age, gender).  History of cardioversion x1 (October 2022). Discontinue amiodarone. Transition to Multaq 40 mg p.o. twice daily Will check hemoglobin/hematocrit and BMP prior to next office visit  Essential hypertension Office blood pressures within acceptable limits but not at goal. Medications reconciled. Currently managed by primary care provider.  Atherosclerosis of left carotid artery Currently asymptomatic. Carotid duplex from October 2023 independently reviewed.  Overall disease progression improved. Monitor for now  FINAL MEDICATION LIST END OF ENCOUNTER: Meds ordered this encounter  Medications   dronedarone (MULTAQ) 400 MG tablet    Sig: Take 1 tablet (400 mg total) by mouth 2 (two) times daily with a meal.    Dispense:  180 tablet    Refill:  0    Medications Discontinued During This Encounter  Medication Reason   Calcium Carb-Cholecalciferol (CALCIUM 600 + D PO) Patient Preference   Carboxymethylcellulose Sodium 1 % GEL Patient Preference   Cholecalciferol (VITAMIN D3) 25 MCG (1000 UT) CAPS Patient Preference   Ferrous Sulfate Dried (SLOW RELEASE IRON) 45 MG TBCR Patient Preference   lidocaine (HM LIDOCAINE PATCH) 4 % Patient Preference   Multiple Vitamin (MULTIVITAMIN WITH MINERALS) TABS tablet Patient Preference   pentoxifylline (TRENTAL) 400 MG CR tablet Patient Preference   tiZANidine (ZANAFLEX) 4 MG tablet Patient Preference    amiodarone (PACERONE) 200 MG tablet Change in therapy     Current Outpatient Medications:    ALPRAZolam (XANAX) 0.25 MG tablet, Take 0.125 mg by mouth 2 (two) times daily as needed for anxiety., Disp: , Rfl:    amLODipine (NORVASC) 10 MG tablet, Take 1 tablet (10 mg total) by mouth daily. (Patient taking differently: Take 10 mg by mouth at bedtime.), Disp: 90 tablet, Rfl: 3   apixaban (ELIQUIS) 2.5 MG TABS tablet, Take 1 tablet (2.5 mg total) by mouth 2 (two) times daily., Disp: 60 tablet, Rfl: 0   cyclobenzaprine (FLEXERIL) 5 MG tablet, Take 5 mg by mouth at bedtime., Disp: , Rfl:    donepezil (ARICEPT) 5 MG tablet, Take 1 tablet by mouth 2 (two) times daily., Disp: , Rfl:    dronedarone (MULTAQ) 400 MG tablet, Take 1 tablet (400 mg total) by mouth 2 (two) times daily with a meal., Disp: 180 tablet, Rfl: 0   furosemide (LASIX) 40 MG tablet, Take 1 tablet (40 mg total) by mouth 2 (two) times daily as needed for fluid or edema. (Patient taking differently: Take 40 mg by mouth 2 (two) times daily.), Disp: 60 tablet, Rfl: 3   metoprolol succinate (TOPROL-XL) 25 MG 24 hr tablet, Take 1 tablet (25 mg total) by mouth daily as needed (Palpitations/elevated heart rate and). Hold if systolic blood pressure (top number) less than 100 mmHg or pulse less than 60 bpm., Disp: 90 tablet, Rfl: 1   omeprazole (PRILOSEC) 40 MG capsule, Take 40 mg by mouth daily as needed (heartburn)., Disp: , Rfl:  Polyethyl Glycol-Propyl Glycol (SYSTANE OP), Place 1 drop into both eyes daily as needed (dry eyes)., Disp: , Rfl:    spironolactone (ALDACTONE) 25 MG tablet, Take 0.5 tablets (12.5 mg total) by mouth daily., Disp: 30 tablet, Rfl: 0   traMADol (ULTRAM) 50 MG tablet, Take 50 mg by mouth every 6 (six) hours as needed., Disp: , Rfl:    zolpidem (AMBIEN) 10 MG tablet, Take 5 mg by mouth at bedtime., Disp: , Rfl:   Orders Placed This Encounter  Procedures   Hemoglobin and hematocrit, blood   Basic metabolic panel   EKG  [id number]    There are no Patient Instructions on file for this visit.   --Continue cardiac medications as reconciled in final medication list. --Return in about 6 months (around 10/24/2022) for Follow up, A. fib. or sooner if needed. --Continue follow-up with your primary care physician regarding the management of your other chronic comorbid conditions.  Patient's questions and concerns were addressed to her satisfaction. She voices understanding of the instructions provided during this encounter.   This note was created using a voice recognition software as a result there may be grammatical errors inadvertently enclosed that do not reflect the nature of this encounter. Every attempt is made to correct such errors.  Rex Kras, [place], [facility]  Pager: [phone] Office: [phone]

## 2022-04-17 ENCOUNTER — Ambulatory Visit: Payer: Medicare Other | Admitting: Cardiology

## 2022-04-22 ENCOUNTER — Telehealth: Payer: Self-pay

## 2022-04-22 NOTE — Telephone Encounter (Signed)
Patient and husband are calling about Multaq. They are concerned about the side effects that are listed on the medication. She has not started it yet, and doesn't know if it is necessary. They said if she is taking blood thinner to prevent clots, then why does she need to take anything else. I explained that it is an anti-arrhythmic drug and what is does. They are wanting to know 1. If it is necessary, and 2. If there are other options.   Please advise. Call back number: [phone]

## 2022-04-23 ENCOUNTER — Other Ambulatory Visit: Payer: Self-pay | Admitting: Cardiology

## 2022-04-23 ENCOUNTER — Ambulatory Visit
Admission: RE | Admit: 2022-04-23 | Discharge: 2022-04-23 | Disposition: A | Discharge status: Home / Self Care | Payer: Medicare Other | Source: Ambulatory Visit | Attending: Internal Medicine | Admitting: Internal Medicine

## 2022-04-23 DIAGNOSIS — Z1231 Encounter for screening mammogram for malignant neoplasm of breast: Secondary | ICD-10-CM

## 2022-04-24 LAB — CMP14+EGFR
ALT: 18 IU/L (ref 0–32)
AST: 18 IU/L (ref 0–40)
Albumin/Globulin Ratio: 1.9 (ref 1.2–2.2)
Albumin: 4.4 g/dL (ref 3.7–4.7)
Alkaline Phosphatase: 87 IU/L (ref 44–121)
BUN/Creatinine Ratio: 14 (ref 12–28)
BUN: 11 mg/dL (ref 8–27)
Bilirubin Total: 0.5 mg/dL (ref 0.0–1.2)
CO2: 23 mmol/L (ref 20–29)
Calcium: 9.3 mg/dL (ref 8.7–10.3)
Chloride: 101 mmol/L (ref 96–106)
Creatinine, Ser: 0.77 mg/dL (ref 0.57–1.00)
Globulin, Total: 2.3 g/dL (ref 1.5–4.5)
Glucose: 91 mg/dL (ref 70–99)
Potassium: 3.5 mmol/L (ref 3.5–5.2)
Sodium: 139 mmol/L (ref 134–144)
Total Protein: 6.7 g/dL (ref 6.0–8.5)
eGFR: 74 mL/min/{1.73_m2} (ref >59)

## 2022-04-24 LAB — CBC WITH DIFFERENTIAL/PLATELET
Basophils Absolute: 0.1 10*3/uL (ref 0.0–0.2)
Basos: 1 %
EOS (ABSOLUTE): 0.1 10*3/uL (ref 0.0–0.4)
Eos: 2 %
Hematocrit: 35.1 % (ref 34.0–46.6)
Hemoglobin: 12.3 g/dL (ref 11.1–15.9)
Immature Grans (Abs): 0 10*3/uL (ref 0.0–0.1)
Immature Granulocytes: 0 %
Lymphocytes Absolute: 1.4 10*3/uL (ref 0.7–3.1)
Lymphs: 22 %
MCH: 36.7 pg — ABNORMAL HIGH (ref 26.6–33.0)
MCHC: 35 g/dL (ref 31.5–35.7)
MCV: 105 fL — ABNORMAL HIGH (ref 79–97)
Monocytes Absolute: 0.7 10*3/uL (ref 0.1–0.9)
Monocytes: 11 %
Neutrophils Absolute: 4 10*3/uL (ref 1.4–7.0)
Neutrophils: 64 %
Platelets: 257 10*3/uL (ref 150–450)
RBC: 3.35 x10E6/uL — ABNORMAL LOW (ref 3.77–5.28)
RDW: 15.3 % (ref 11.7–15.4)
WBC: 6.2 10*3/uL (ref 3.4–10.8)

## 2022-04-27 IMAGING — MG DIGITAL SCREENING BILAT W/ TOMO W/ CAD
8 series · 8 of 24 positions shown · non-contrast
Comparison: Previous exam(s).

CLINICAL DATA: Screening.

EXAM:
DIGITAL SCREENING BILATERAL MAMMOGRAM WITH TOMO AND CAD

[R MLO synth-2D]
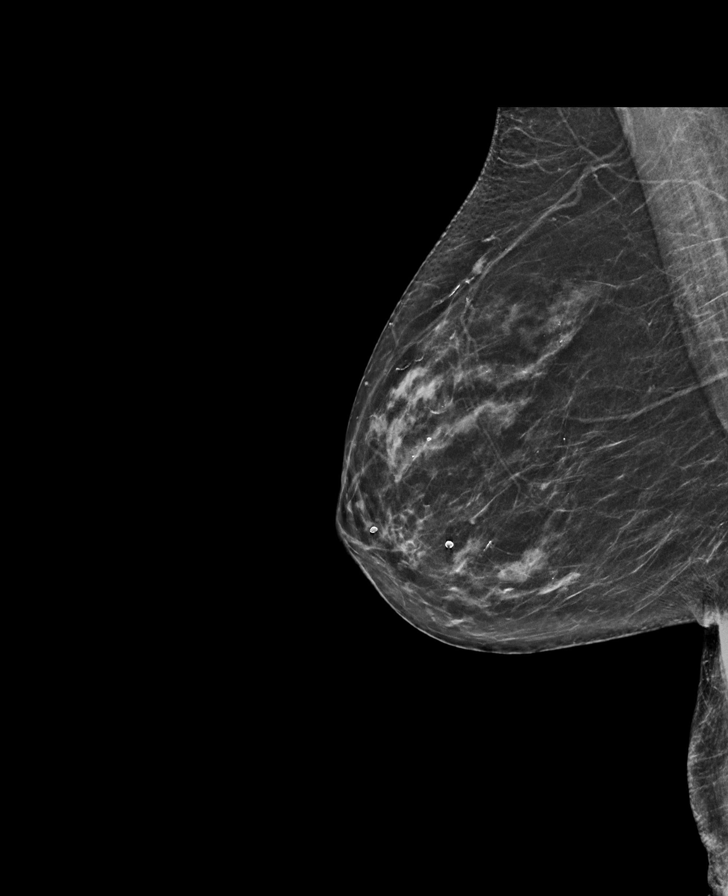

[R CC synth-2D]
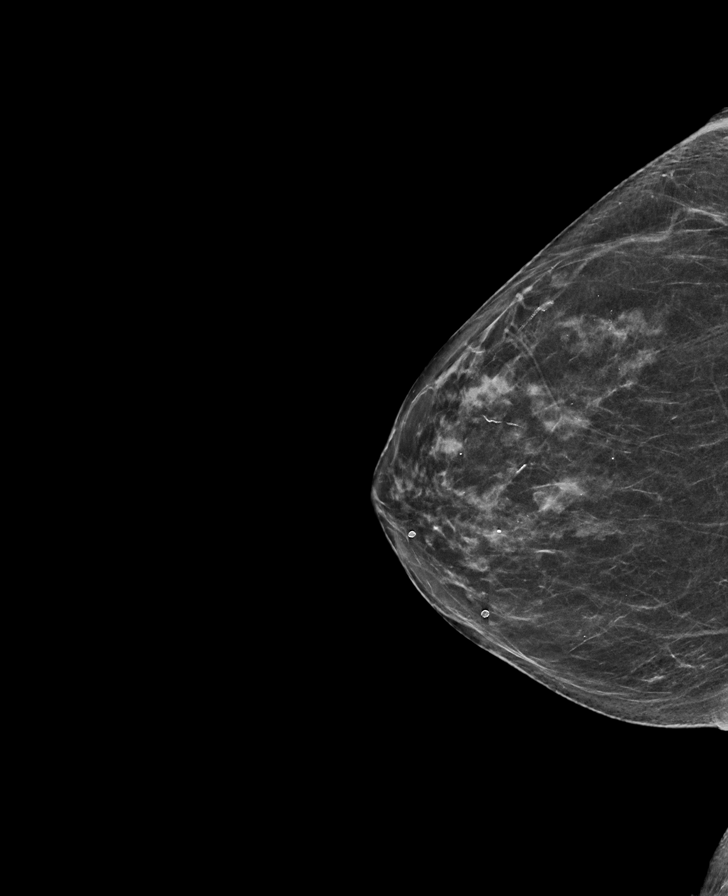

[L CC synth-2D]
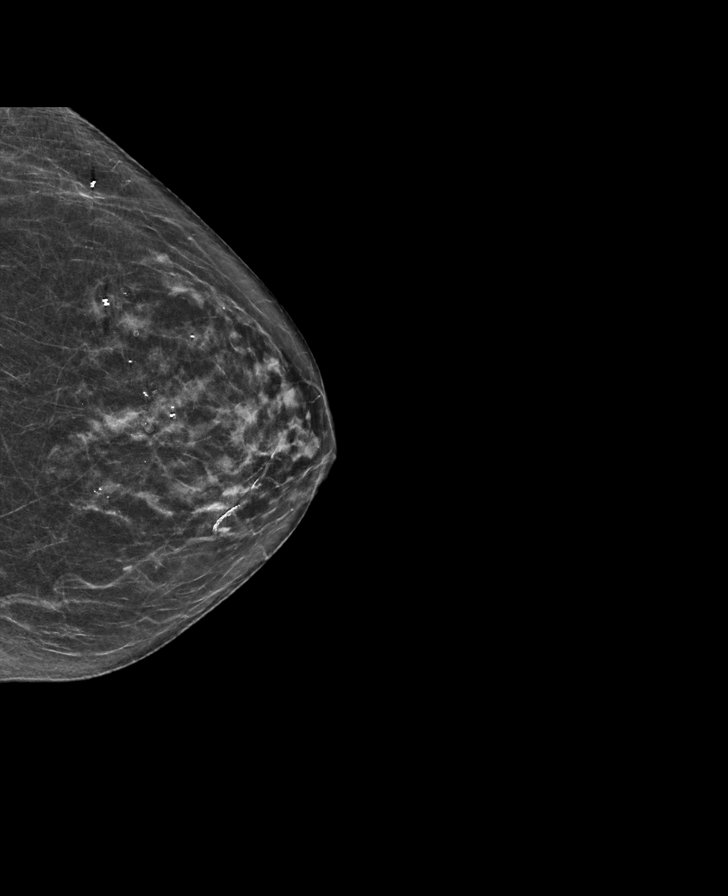

[L MLO synth-2D]
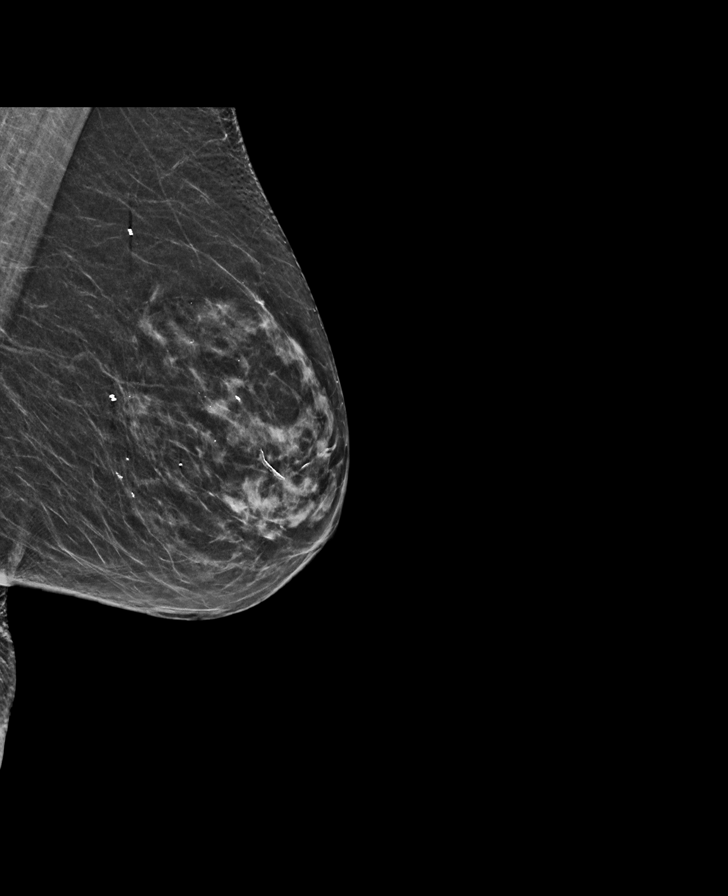

[L CC tomo · tomo slice 30/59.0]
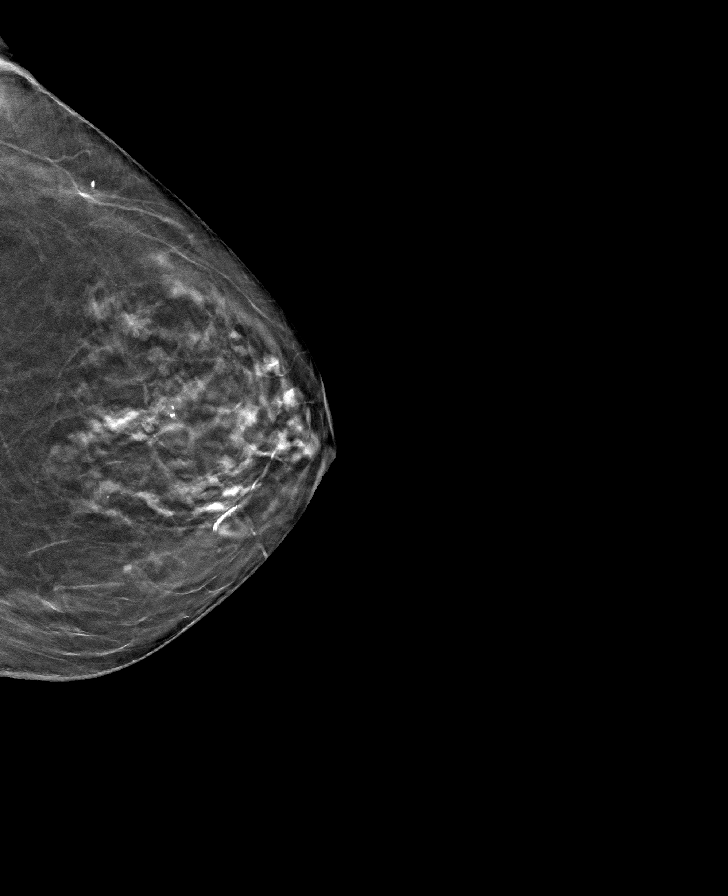

[L MLO tomo · tomo slice 29/57.0]
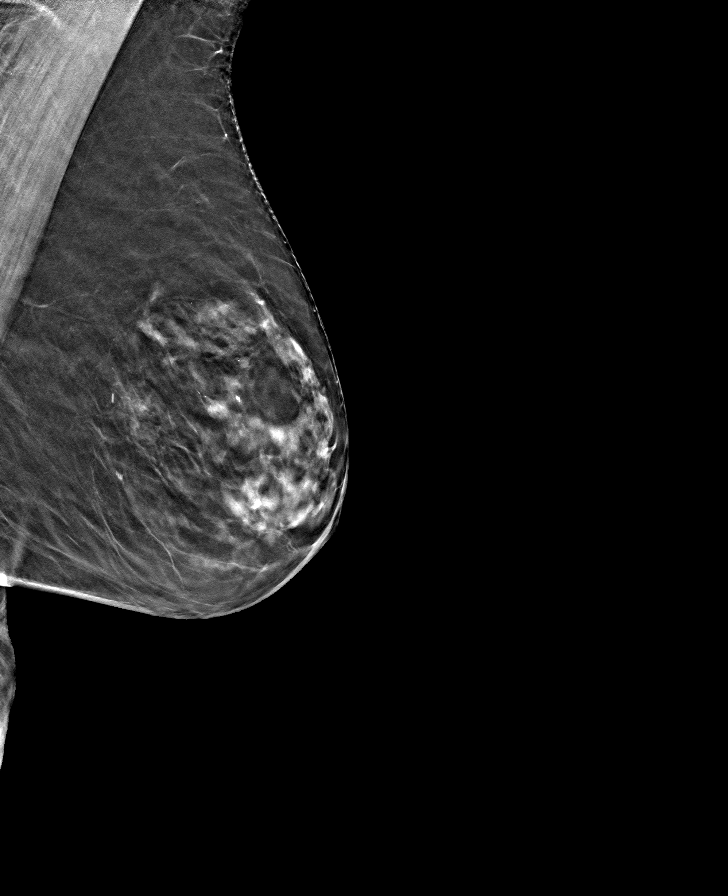

[R MLO tomo · tomo slice 30/59.0]
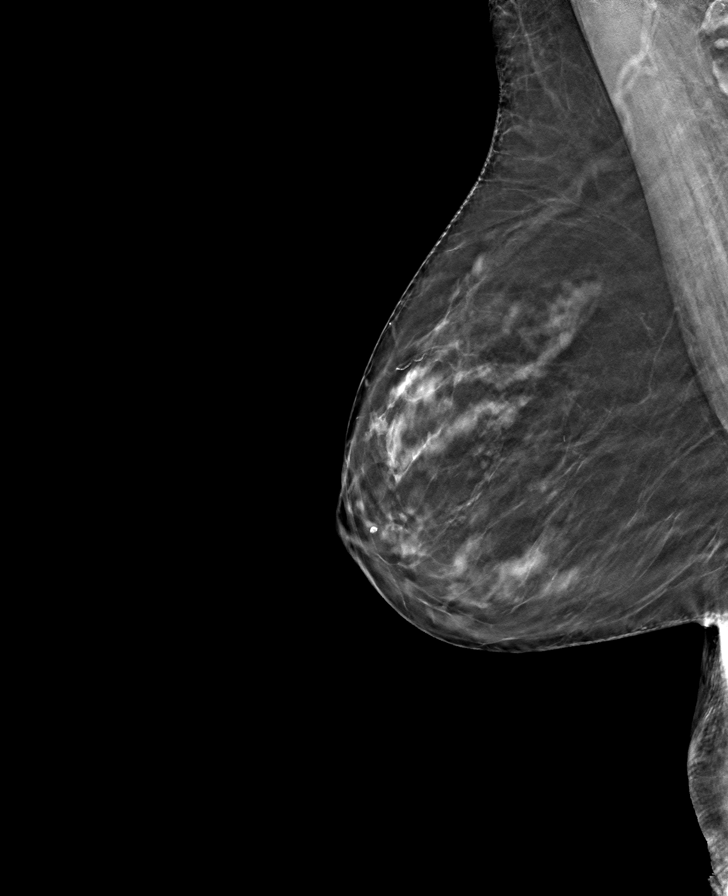

[R CC tomo · tomo slice 30/59.0]
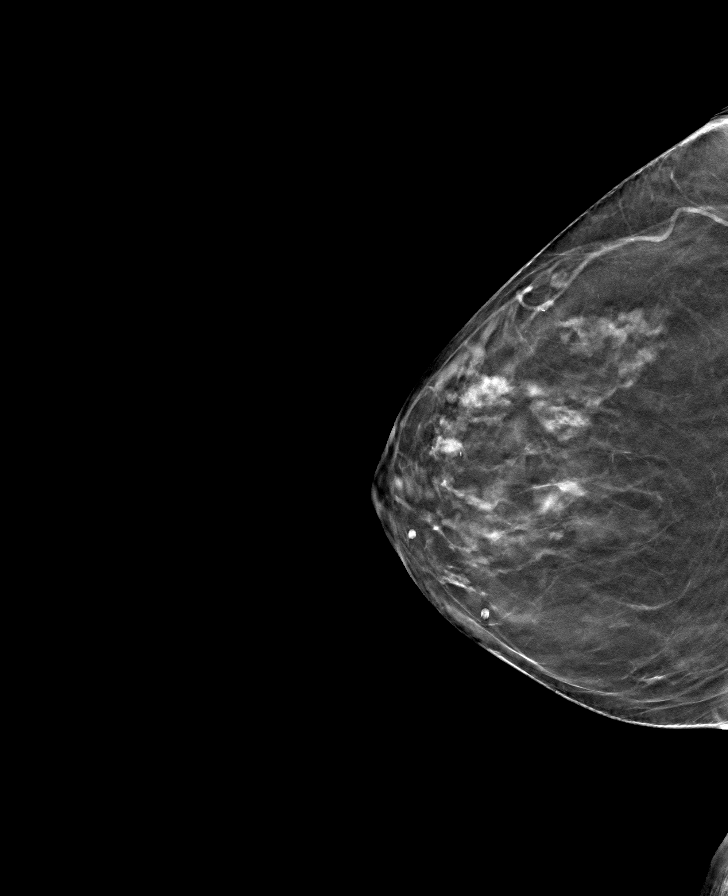

[8 of 24 positions shown; findings below may reference images not displayed]

ACR Breast Density Category b: There are scattered areas of
fibroglandular density.
FINDINGS: In the right breast, calcifications warrant further evaluation with
magnified views. In the left breast, no findings suspicious for
malignancy. Images were processed with CAD.
IMPRESSION: Further evaluation is suggested for calcifications in the right
breast.

RECOMMENDATION:
Diagnostic mammogram of the right breast. (Code:[id number])

The patient will be contacted regarding the findings, and additional
imaging will be scheduled.

BI-RADS CATEGORY  0: Incomplete. Need additional imaging evaluation
and/or prior mammograms for comparison.

## 2022-04-28 ENCOUNTER — Other Ambulatory Visit: Payer: Self-pay | Admitting: Internal Medicine

## 2022-04-28 DIAGNOSIS — N6310 Unspecified lump in the right breast, unspecified quadrant: Secondary | ICD-10-CM

## 2022-04-30 NOTE — Telephone Encounter (Signed)
Called [phone] - her husband answered. He was grocery shopping did not have any questions regarding Multaq.  He requested that I call his wife.  Called the home [phone] and left voicemail.  Sora Olivo [place], DO, [facility]

## 2022-04-30 NOTE — Telephone Encounter (Signed)
I will follow up, it was the husband that called the other day with the patient. I will get clarification.

## 2022-05-01 NOTE — Progress Notes (Signed)
Gave patient results, she acknowledged understanding and had no further questions.

## 2022-05-02 ENCOUNTER — Other Ambulatory Visit: Payer: Self-pay

## 2022-05-02 DIAGNOSIS — Z7901 Long term (current) use of anticoagulants: Secondary | ICD-10-CM

## 2022-05-02 DIAGNOSIS — Z79899 Other long term (current) drug therapy: Secondary | ICD-10-CM

## 2022-05-06 ENCOUNTER — Other Ambulatory Visit: Payer: Self-pay

## 2022-05-06 DIAGNOSIS — I10 Essential (primary) hypertension: Secondary | ICD-10-CM

## 2022-05-06 NOTE — Progress Notes (Signed)
For reinitiating of spironolactone '25mg'$  1/2 tab daily.

## 2022-05-09 ENCOUNTER — Other Ambulatory Visit: Payer: Self-pay

## 2022-05-09 DIAGNOSIS — I4819 Other persistent atrial fibrillation: Secondary | ICD-10-CM

## 2022-05-09 MED ORDER — AMIODARONE HCL 100 MG PO TABS: 100.0000 mg | ORAL_TABLET | Freq: Every day | ORAL | 2 refills | Status: DC

## 2022-05-09 NOTE — Progress Notes (Signed)
Per conversation with Dr. Terri Skains - transitioning patient back to amiodarone 100mg  daily. (Patient reports tiredness, weakness and lack of appetite since starting Multaq)  Patient to hold Multaq for Saturday, Sunday and Monday then resume antiarrythmic therapy with amiodarone 100mg  daily. Patient acknowledged understanding.

## 2022-05-12 ENCOUNTER — Ambulatory Visit: Payer: Medicare Other

## 2022-05-12 ENCOUNTER — Ambulatory Visit
Admission: RE | Admit: 2022-05-12 | Discharge: 2022-05-12 | Disposition: A | Discharge status: Home / Self Care | Payer: Medicare Other | Source: Ambulatory Visit | Attending: Internal Medicine | Admitting: Internal Medicine

## 2022-05-12 DIAGNOSIS — N6310 Unspecified lump in the right breast, unspecified quadrant: Secondary | ICD-10-CM

## 2022-05-13 LAB — BASIC METABOLIC PANEL
BUN/Creatinine Ratio: 15 (ref 12–28)
BUN: 12 mg/dL (ref 8–27)
CO2: 23 mmol/L (ref 20–29)
Calcium: 9.1 mg/dL (ref 8.7–10.3)
Chloride: 99 mmol/L (ref 96–106)
Creatinine, Ser: 0.82 mg/dL (ref 0.57–1.00)
Glucose: 125 mg/dL — ABNORMAL HIGH (ref 70–99)
Potassium: 4.1 mmol/L (ref 3.5–5.2)
Sodium: 138 mmol/L (ref 134–144)
eGFR: 68 mL/min/{1.73_m2} (ref >59)

## 2022-05-29 ENCOUNTER — Emergency Department (HOSPITAL_COMMUNITY): Payer: Medicare Other

## 2022-05-29 ENCOUNTER — Other Ambulatory Visit: Payer: Self-pay

## 2022-05-29 ENCOUNTER — Emergency Department (HOSPITAL_COMMUNITY)
Admission: EM | Admit: 2022-05-29 | Discharge: 2022-05-29 | Disposition: A | Discharge status: Home / Self Care | Payer: Medicare Other | Attending: Emergency Medicine | Admitting: Emergency Medicine

## 2022-05-29 ENCOUNTER — Encounter (HOSPITAL_COMMUNITY): Payer: Self-pay | Admitting: Pharmacy Technician

## 2022-05-29 ENCOUNTER — Telehealth: Payer: Self-pay

## 2022-05-29 DIAGNOSIS — Z91040 Latex allergy status: Secondary | ICD-10-CM | POA: Diagnosis not present

## 2022-05-29 DIAGNOSIS — R531 Weakness: Secondary | ICD-10-CM

## 2022-05-29 DIAGNOSIS — Z1152 Encounter for screening for COVID-19: Secondary | ICD-10-CM | POA: Insufficient documentation

## 2022-05-29 DIAGNOSIS — I272 Pulmonary hypertension, unspecified: Secondary | ICD-10-CM | POA: Insufficient documentation

## 2022-05-29 DIAGNOSIS — Z7901 Long term (current) use of anticoagulants: Secondary | ICD-10-CM | POA: Diagnosis not present

## 2022-05-29 DIAGNOSIS — E871 Hypo-osmolality and hyponatremia: Secondary | ICD-10-CM | POA: Insufficient documentation

## 2022-05-29 LAB — CBC WITH DIFFERENTIAL/PLATELET
Abs Immature Granulocytes: 0.03 10*3/uL (ref 0.00–0.07)
Basophils Absolute: 0.1 10*3/uL (ref 0.0–0.1)
Basophils Relative: 1 %
Eosinophils Absolute: 0.1 10*3/uL (ref 0.0–0.5)
Eosinophils Relative: 1 %
HCT: 30.3 % — ABNORMAL LOW (ref 36.0–46.0)
Hemoglobin: 10.7 g/dL — ABNORMAL LOW (ref 12.0–15.0)
Immature Granulocytes: 1 %
Lymphocytes Relative: 15 %
Lymphs Abs: 0.9 10*3/uL (ref 0.7–4.0)
MCH: 37.3 pg — ABNORMAL HIGH (ref 26.0–34.0)
MCHC: 35.3 g/dL (ref 30.0–36.0)
MCV: 105.6 fL — ABNORMAL HIGH (ref 80.0–100.0)
Monocytes Absolute: 0.6 10*3/uL (ref 0.1–1.0)
Monocytes Relative: 10 %
Neutro Abs: 4.3 10*3/uL (ref 1.7–7.7)
Neutrophils Relative %: 72 %
Platelets: 213 10*3/uL (ref 150–400)
RBC: 2.87 MIL/uL — ABNORMAL LOW (ref 3.87–5.11)
RDW: 16 % — ABNORMAL HIGH (ref 11.5–15.5)
WBC: 5.9 10*3/uL (ref 4.0–10.5)
nRBC: 0 % (ref 0.0–0.2)

## 2022-05-29 LAB — URINALYSIS, ROUTINE W REFLEX MICROSCOPIC
Bacteria, UA: NONE SEEN
Bilirubin Urine: NEGATIVE
Glucose, UA: NEGATIVE mg/dL
Hgb urine dipstick: NEGATIVE
Ketones, ur: NEGATIVE mg/dL
Nitrite: NEGATIVE
Protein, ur: NEGATIVE mg/dL
Specific Gravity, Urine: 1.003 — ABNORMAL LOW (ref 1.005–1.030)
pH: 7 (ref 5.0–8.0)

## 2022-05-29 LAB — SARS CORONAVIRUS 2 BY RT PCR: SARS Coronavirus 2 by RT PCR: NEGATIVE

## 2022-05-29 LAB — COMPREHENSIVE METABOLIC PANEL
ALT: 14 U/L (ref 0–44)
AST: 20 U/L (ref 15–41)
Albumin: 3.9 g/dL (ref 3.5–5.0)
Alkaline Phosphatase: 54 U/L (ref 38–126)
Anion gap: 5 (ref 5–15)
BUN: 15 mg/dL (ref 8–23)
CO2: 24 mmol/L (ref 22–32)
Calcium: 8.4 mg/dL — ABNORMAL LOW (ref 8.9–10.3)
Chloride: 101 mmol/L (ref 98–111)
Creatinine, Ser: 0.89 mg/dL (ref 0.44–1.00)
GFR, Estimated: >60 mL/min (ref >60)
Glucose, Bld: 133 mg/dL — ABNORMAL HIGH (ref 70–99)
Potassium: 3.9 mmol/L (ref 3.5–5.1)
Sodium: 130 mmol/L — ABNORMAL LOW (ref 135–145)
Total Bilirubin: 0.5 mg/dL (ref 0.3–1.2)
Total Protein: 6.5 g/dL (ref 6.5–8.1)

## 2022-05-29 LAB — LIPASE, BLOOD: Lipase: 29 U/L (ref 11–51)

## 2022-05-29 LAB — TROPONIN I (HIGH SENSITIVITY): Troponin I (High Sensitivity): 4 ng/L (ref <18)

## 2022-05-29 MED ORDER — SODIUM CHLORIDE 0.9 % IV BOLUS
1000.0000 mL | Freq: Once | INTRAVENOUS | Status: AC
  Administered 2022-05-29: 1000 mL via INTRAVENOUS

## 2022-05-29 NOTE — ED Provider Notes (Signed)
[facility] Provider Note   CSN: [id number] Arrival date & time: 05/29/22  1624     History  Chief Complaint  Patient presents with   Weakness    Kristi Montgomery is a 89 y.o. female with a history of paroxysmal A-fib, on Eliquis and metoprolol, presented to ED with constellation of symptoms.  The patient reports that she was started on metoprolol about a month ago.  She says within a few days of starting the medication she noticed a drop in her energy level, fatigue, nausea, loss of appetite, shortness of breath and lightheadedness.  Her cardiologist briefly took her off metoprolol, and they opted to restart her at the lowest dose 12.5 mg twice daily which she has been taking.  She continues to feel weak, lightheaded and dizzy.  She denies any chest pressure or pain.  She denies headache.  He denies loss of consciousness or head trauma.  She reports that she is no longer taking amiodarone which was prescribed in the past.  HPI     Home Medications Prior to Admission medications   Medication Sig Start Date End Date Taking? Authorizing Provider  ALPRAZolam (XANAX) 0.25 MG tablet Take 0.125 mg by mouth 2 (two) times daily as needed for anxiety.    [provider]  amiodarone (PACERONE) 100 MG tablet Take 1 tablet (100 mg total) by mouth daily. 05/09/22   Tolia, Sunit, DO  amLODipine (NORVASC) 10 MG tablet Take 1 tablet (10 mg total) by mouth daily. Patient taking differently: Take 10 mg by mouth at bedtime. 12/18/20 04/15/22  Cantwell, Celeste C, PA-C  apixaban (ELIQUIS) 2.5 MG TABS tablet Take 1 tablet (2.5 mg total) by mouth 2 (two) times daily. 10/25/20   Domenic Polite, MD  cyclobenzaprine (FLEXERIL) 5 MG tablet Take 5 mg by mouth at bedtime. 03/26/22   [provider]  donepezil (ARICEPT) 5 MG tablet Take 1 tablet by mouth 2 (two) times daily.    [provider]  furosemide (LASIX) 40 MG tablet Take 1 tablet (40 mg  total) by mouth 2 (two) times daily as needed for fluid or edema. Patient taking differently: Take 40 mg by mouth 2 (two) times daily. 02/07/21 04/15/22  Cantwell, Celeste C, PA-C  metoprolol succinate (TOPROL-XL) 25 MG 24 hr tablet Take 1 tablet (25 mg total) by mouth daily as needed (Palpitations/elevated heart rate and). Hold if systolic blood pressure (top number) less than 100 mmHg or pulse less than 60 bpm. 10/15/21 04/15/22  Tolia, Sunit, DO  omeprazole (PRILOSEC) 40 MG capsule Take 40 mg by mouth daily as needed (heartburn).    [provider]  Polyethyl Glycol-Propyl Glycol (SYSTANE OP) Place 1 drop into both eyes daily as needed (dry eyes).    [provider]  spironolactone (ALDACTONE) 25 MG tablet Take 0.5 tablets (12.5 mg total) by mouth daily. 03/04/22   Tolia, Sunit, DO  traMADol (ULTRAM) 50 MG tablet Take 50 mg by mouth every 6 (six) hours as needed. 03/26/22   [provider]  zolpidem (AMBIEN) 10 MG tablet Take 5 mg by mouth at bedtime. 09/18/20   [provider]      Allergies    Amoxicillin, Amoxicillin-pot clavulanate, Codeine, Latex, and Tape    Review of Systems   Review of Systems  Physical Exam Updated Vital Signs BP 127/78   Pulse 72   Temp 98 F (36.7 C)   Resp 16   SpO2 99%  Physical  Exam Constitutional:      General: She is not in acute distress. HENT:     Head: Normocephalic and atraumatic.  Eyes:     Conjunctiva/sclera: Conjunctivae normal.     Pupils: Pupils are equal, round, and reactive to light.  Cardiovascular:     Rate and Rhythm: Normal rate and regular rhythm.  Pulmonary:     Effort: Pulmonary effort is normal. No respiratory distress.  Abdominal:     General: There is no distension.     Tenderness: There is no abdominal tenderness.  Skin:    General: Skin is warm and dry.  Neurological:     General: No focal deficit present.     Mental Status: She is alert. Mental status is at baseline.  Psychiatric:         Mood and Affect: Mood normal.        Behavior: Behavior normal.     ED Results / Procedures / Treatments   Labs (all labs ordered are listed, but only abnormal results are displayed) Labs Reviewed  CBC WITH DIFFERENTIAL/PLATELET - Abnormal; Notable for the following components:      Result Value   RBC 2.87 (*)    Hemoglobin 10.7 (*)    HCT 30.3 (*)    MCV 105.6 (*)    MCH 37.3 (*)    RDW 16.0 (*)    All other components within normal limits  COMPREHENSIVE METABOLIC PANEL - Abnormal; Notable for the following components:   Sodium 130 (*)    Glucose, Bld 133 (*)    Calcium 8.4 (*)    All other components within normal limits  URINALYSIS, ROUTINE W REFLEX MICROSCOPIC - Abnormal; Notable for the following components:   Color, Urine COLORLESS (*)    Specific Gravity, Urine 1.003 (*)    Leukocytes,Ua TRACE (*)    All other components within normal limits  SARS CORONAVIRUS 2 BY RT PCR  LIPASE, BLOOD  TROPONIN I (HIGH SENSITIVITY)    EKG EKG Interpretation  Date/Time:  Thursday May 29 2022 16:40:35 EDT Ventricular Rate:  81 PR Interval:  112 QRS Duration: 97 QT Interval:  372 QTC Calculation: 432 R Axis:   67 Text Interpretation: Sinus rhythm Borderline short PR interval Low voltage, precordial leads Confirmed by Octaviano Glow [phone]) on 05/29/2022 4:58:27 PM  Radiology DG Chest 2 View  Result Date: 05/29/2022 CLINICAL DATA:  Shortness of breath, chest heaviness, weakness, history hypertension, former smoker EXAM: CHEST - 2 VIEW COMPARISON:  11/15/2020 FINDINGS: Normal heart size, mediastinal contours, and pulmonary vascularity. Atherosclerotic calcification aorta. Lungs appear emphysematous but clear. No pulmonary infiltrate, pleural effusion, or pneumothorax. Osseous demineralization. IMPRESSION: Emphysematous changes without acute abnormalities. Aortic Atherosclerosis (ICD10-I70.0) and Emphysema (ICD10-J43.9). Electronically Signed   By: Lavonia Dana M.D.   On:  05/29/2022 17:57    Procedures Procedures    Medications Ordered in ED Medications  sodium chloride 0.9 % bolus 1,000 mL (0 mLs Intravenous Stopped 05/29/22 1921)    ED Course/ Medical Decision Making/ A&P Clinical Course as of 05/29/22 2055  Thu May 29, 2022  2024 Patient was reassessed.  Her daughter is now present at bedside as well as her husband.  I explained have not found any emergent cause for her weakness in her workup today.  There is some question about potential polypharmacy at home, or some confusion about how she takes metoprolol, she may have taken an extra dose at night when she feels "palpitations before bedtime".  I advised that this  medicine be taken once a day as it is extended release.  They will need to clarify the dosing and frequency with her cardiologist or PCP and will do so tomorrow.  Otherwise the family has no immediate concerns and she is stable for discharge [MT]    Clinical Course User Index [MT] Shivank Pinedo, Carola Rhine, MD                             Medical Decision Making Amount and/or Complexity of Data Reviewed Labs: ordered. Radiology: ordered.   This patient presents to the ED with concern for generalized weakness. This involves an extensive number of treatment options, and is a complaint that carries with it a high risk of complications and morbidity.  The differential diagnosis includes dehydration worse electrolyte derangement versus metabolic derangement versus atypical ACS versus arrhythmia versus other  Co-morbidities that complicate the patient evaluation: She has a longstanding history of intermittent hyponatremia of unclear etiology, will need to recheck electrolyte levels as this may be an underlying issue.  Additional history obtained from the patient's husband at bedside  External records from outside source obtained and reviewed including most recent echocardiogram from January 2023 with EF 60-65%, tricuspid regurg, mild to moderate  pulmonary hypertension  I ordered and personally interpreted labs.  The pertinent results include: Labs appear near baseline levels.  Troponin is negative.  Chronic baseline anemia with hemoglobin of 10.7.  No leukocytosis.  No AKI.  Very mild hyponatremia.  COVID test negative  I ordered imaging studies including x-ray of the chest I independently visualized and interpreted imaging which showed no emergent findings I agree with the radiologist interpretation  The patient was maintained on a cardiac monitor.  I personally viewed and interpreted the cardiac monitored which showed an underlying rhythm of: Sinus rhythm  Per my interpretation the patient's ECG shows sinus rhythm no acute ischemic findings  I ordered medication including IV fluid bolus for dizziness, hyponatremia  I have reviewed the patients home medicines and have made adjustments as needed  Test Considered: I have a very low suspicion for acute PE and do not see an indication for emergent pulmonary embolism scan at this time   After the interventions noted above, I reevaluated the patient and found that they have: stayed the same   Dispostion:  After consideration of the diagnostic results and the patients response to treatment, I feel that the patent would benefit from close outpatient follow-up.        Final Clinical Impression(s) / ED Diagnoses Final diagnoses:  Weakness    Rx / DC Orders ED Discharge Orders     None         Wyvonnia Dusky, MD 05/29/22 2055

## 2022-05-29 NOTE — ED Triage Notes (Signed)
Pt here via POV with reports of weakness for the last few weeks. States her medications were changed around the same time. Endorses chest heaviness and shob.

## 2022-05-29 NOTE — ED Notes (Signed)
Patient ambulatory to bathroom to provide urine specimen.

## 2022-05-29 NOTE — Telephone Encounter (Signed)
Since she called later in the day and no openings open for the remainder of the day.  She can come in tomorrow for office visit or go to nearest ER if needs sooner attention.   Kosta Schnitzler [place], DO, [facility]

## 2022-05-29 NOTE — Telephone Encounter (Signed)
Patient called to inform us that she is dizzy and mot feeling good for the past week. Patient mention she call her PCP and she was informed to give [place] a call. Patient wants to know should she been seen by [place] today.

## 2022-05-30 NOTE — Telephone Encounter (Signed)
Patient needs an appt.

## 2022-07-02 ENCOUNTER — Emergency Department (HOSPITAL_BASED_OUTPATIENT_CLINIC_OR_DEPARTMENT_OTHER): Payer: Medicare Other

## 2022-07-02 ENCOUNTER — Other Ambulatory Visit (HOSPITAL_BASED_OUTPATIENT_CLINIC_OR_DEPARTMENT_OTHER): Payer: Self-pay

## 2022-07-02 ENCOUNTER — Encounter (HOSPITAL_BASED_OUTPATIENT_CLINIC_OR_DEPARTMENT_OTHER): Payer: Self-pay | Admitting: Emergency Medicine

## 2022-07-02 ENCOUNTER — Other Ambulatory Visit: Payer: Self-pay

## 2022-07-02 ENCOUNTER — Emergency Department (HOSPITAL_BASED_OUTPATIENT_CLINIC_OR_DEPARTMENT_OTHER)
Admission: EM | Admit: 2022-07-02 | Discharge: 2022-07-02 | Disposition: A | Discharge status: Home / Self Care | Payer: Medicare Other | Attending: Emergency Medicine | Admitting: Emergency Medicine

## 2022-07-02 DIAGNOSIS — R42 Dizziness and giddiness: Secondary | ICD-10-CM | POA: Diagnosis not present

## 2022-07-02 DIAGNOSIS — Z7901 Long term (current) use of anticoagulants: Secondary | ICD-10-CM | POA: Insufficient documentation

## 2022-07-02 DIAGNOSIS — R5383 Other fatigue: Secondary | ICD-10-CM | POA: Insufficient documentation

## 2022-07-02 DIAGNOSIS — I4891 Unspecified atrial fibrillation: Secondary | ICD-10-CM | POA: Diagnosis not present

## 2022-07-02 DIAGNOSIS — R0602 Shortness of breath: Secondary | ICD-10-CM | POA: Insufficient documentation

## 2022-07-02 DIAGNOSIS — R531 Weakness: Secondary | ICD-10-CM | POA: Insufficient documentation

## 2022-07-02 DIAGNOSIS — I10 Essential (primary) hypertension: Secondary | ICD-10-CM | POA: Insufficient documentation

## 2022-07-02 DIAGNOSIS — Z91040 Latex allergy status: Secondary | ICD-10-CM | POA: Diagnosis not present

## 2022-07-02 DIAGNOSIS — R5381 Other malaise: Secondary | ICD-10-CM | POA: Diagnosis not present

## 2022-07-02 DIAGNOSIS — Z79899 Other long term (current) drug therapy: Secondary | ICD-10-CM | POA: Insufficient documentation

## 2022-07-02 LAB — COMPREHENSIVE METABOLIC PANEL
ALT: 18 U/L (ref 0–44)
AST: 25 U/L (ref 15–41)
Albumin: 4.4 g/dL (ref 3.5–5.0)
Alkaline Phosphatase: 64 U/L (ref 38–126)
Anion gap: 9 (ref 5–15)
BUN: 15 mg/dL (ref 8–23)
CO2: 26 mmol/L (ref 22–32)
Calcium: 9.5 mg/dL (ref 8.9–10.3)
Chloride: 99 mmol/L (ref 98–111)
Creatinine, Ser: 0.77 mg/dL (ref 0.44–1.00)
GFR, Estimated: >60 mL/min (ref >60)
Glucose, Bld: 131 mg/dL — ABNORMAL HIGH (ref 70–99)
Potassium: 3.7 mmol/L (ref 3.5–5.1)
Sodium: 134 mmol/L — ABNORMAL LOW (ref 135–145)
Total Bilirubin: 1 mg/dL (ref 0.3–1.2)
Total Protein: 7.7 g/dL (ref 6.5–8.1)

## 2022-07-02 LAB — CBC WITH DIFFERENTIAL/PLATELET
Abs Immature Granulocytes: 0.03 10*3/uL (ref 0.00–0.07)
Basophils Absolute: 0.1 10*3/uL (ref 0.0–0.1)
Basophils Relative: 1 %
Eosinophils Absolute: 0.1 10*3/uL (ref 0.0–0.5)
Eosinophils Relative: 2 %
HCT: 34.4 % — ABNORMAL LOW (ref 36.0–46.0)
Hemoglobin: 12.1 g/dL (ref 12.0–15.0)
Immature Granulocytes: 0 %
Lymphocytes Relative: 15 %
Lymphs Abs: 1.1 10*3/uL (ref 0.7–4.0)
MCH: 37.3 pg — ABNORMAL HIGH (ref 26.0–34.0)
MCHC: 35.2 g/dL (ref 30.0–36.0)
MCV: 106.2 fL — ABNORMAL HIGH (ref 80.0–100.0)
Monocytes Absolute: 0.7 10*3/uL (ref 0.1–1.0)
Monocytes Relative: 10 %
Neutro Abs: 5.2 10*3/uL (ref 1.7–7.7)
Neutrophils Relative %: 72 %
Platelets: 262 10*3/uL (ref 150–400)
RBC: 3.24 MIL/uL — ABNORMAL LOW (ref 3.87–5.11)
RDW: 16.5 % — ABNORMAL HIGH (ref 11.5–15.5)
WBC: 7.2 10*3/uL (ref 4.0–10.5)
nRBC: 0.3 % — ABNORMAL HIGH (ref 0.0–0.2)

## 2022-07-02 LAB — URINALYSIS, ROUTINE W REFLEX MICROSCOPIC
Bilirubin Urine: NEGATIVE
Glucose, UA: NEGATIVE mg/dL
Hgb urine dipstick: NEGATIVE
Ketones, ur: NEGATIVE mg/dL
Leukocytes,Ua: NEGATIVE
Nitrite: NEGATIVE
Protein, ur: NEGATIVE mg/dL
Specific Gravity, Urine: 1.021 (ref 1.005–1.030)
pH: 7 (ref 5.0–8.0)

## 2022-07-02 LAB — TSH: TSH: 3.425 u[IU]/mL (ref 0.350–4.500)

## 2022-07-02 LAB — CBG MONITORING, ED: Glucose-Capillary: 130 mg/dL — ABNORMAL HIGH (ref 70–99)

## 2022-07-02 NOTE — Discharge Instructions (Signed)
Thank you for allowing me to be part of your care today.  Your workup is overall reassuring and you did not have evidence of pneumonia, other infection, electrolyte imbalances, or anemia.    Please schedule a follow-up appointment with your primary care provider to discuss your symptoms and your medications.    Return to the ED if you develop worsening of your symptoms or if you have any new concerns.

## 2022-07-02 NOTE — ED Notes (Signed)
Pt verbalized understanding of d/c instructions, meds, and followup care. Denies questions. VSS, no distress noted. Steady gait to exit with all belongings.  ?

## 2022-07-02 NOTE — Progress Notes (Signed)
RT ambulated Pt on RA SAT 94-97% and Hr 96

## 2022-07-02 NOTE — ED Triage Notes (Signed)
Pt arrives to ED with c/o weakness for many weeks associated with SOB, malaise, fatigue, and lightheadedness.

## 2022-07-02 NOTE — ED Provider Notes (Signed)
[facility]GENCY DEPARTMENT AT [facility] Provider Note   CSN: [id number] Arrival date & time: 07/02/22  1015     History  Chief Complaint  Patient presents with   Weakness    Kristi Montgomery is a 89 y.o. female with past medical history of hypertension, memory disorder, anxiety, A-fib presents to the ED complaining of fatigue, weakness, shortness of breath, and lightheadedness for the past several weeks.  Patient was evaluated back in April for similar symptoms.  She reports she has not had significant change in her symptoms, however, she is concerned due to her feelings of malaise and like she cannot do as much as she used to.  She also reports that she has had a cough associated with her shortness of breath.  Denies fever, chest tightness, chest pain, palpitations, nausea, vomiting, diarrhea, headache, dizziness, syncope.  She has been eating and drinking normally at home and taking her medications as prescribed.       Home Medications Prior to Admission medications   Medication Sig Start Date End Date Taking? Authorizing Provider  traZODone (DESYREL) 100 MG tablet Take 100 mg by mouth at bedtime. 06/09/22  Yes [provider]  ALPRAZolam (XANAX) 0.25 MG tablet Take 0.125 mg by mouth 2 (two) times daily as needed for anxiety.    [provider]  amiodarone (PACERONE) 100 MG tablet Take 1 tablet (100 mg total) by mouth daily. 05/09/22   Tolia, Sunit, DO  amLODipine (NORVASC) 10 MG tablet Take 1 tablet (10 mg total) by mouth daily. Patient taking differently: Take 10 mg by mouth at bedtime. 12/18/20 04/15/22  Cantwell, Celeste C, PA-C  apixaban (ELIQUIS) 2.5 MG TABS tablet Take 1 tablet (2.5 mg total) by mouth 2 (two) times daily. 10/25/20   Zannie Cove, MD  cyclobenzaprine (FLEXERIL) 5 MG tablet Take 5 mg by mouth at bedtime. 03/26/22   [provider]  donepezil (ARICEPT) 5 MG tablet Take 1 tablet by mouth 2 (two) times daily.    [provider]  furosemide (LASIX) 40 MG tablet Take 1 tablet (40 mg total) by mouth 2 (two) times daily as needed for fluid or edema. Patient taking differently: Take 40 mg by mouth 2 (two) times daily. 02/07/21 04/15/22  Cantwell, Celeste C, PA-C  metoprolol succinate (TOPROL-XL) 25 MG 24 hr tablet Take 1 tablet (25 mg total) by mouth daily as needed (Palpitations/elevated heart rate and). Hold if systolic blood pressure (top number) less than 100 mmHg or pulse less than 60 bpm. 10/15/21 04/15/22  Tolia, Sunit, DO  omeprazole (PRILOSEC) 40 MG capsule Take 40 mg by mouth daily as needed (heartburn).    [provider]  Polyethyl Glycol-Propyl Glycol (SYSTANE OP) Place 1 drop into both eyes daily as needed (dry eyes).    [provider]  spironolactone (ALDACTONE) 25 MG tablet Take 0.5 tablets (12.5 mg total) by mouth daily. 03/04/22   Tolia, Sunit, DO  traMADol (ULTRAM) 50 MG tablet Take 50 mg by mouth every 6 (six) hours as needed. 03/26/22   [provider]  zolpidem (AMBIEN) 10 MG tablet Take 5 mg by mouth at bedtime. 09/18/20   [provider]      Allergies    Amoxicillin, Amoxicillin-pot clavulanate, Codeine, Latex, and Tape    Review of Systems   Review of Systems  Constitutional:  Positive for fatigue. Negative for activity change and fever.  Respiratory:  Positive for cough and shortness of breath. Negative for chest tightness.  Cardiovascular:  Negative for chest pain and palpitations.  Gastrointestinal:  Negative for diarrhea, nausea and vomiting.  Neurological:  Positive for weakness and light-headedness. Negative for dizziness, syncope and headaches.    Physical Exam Updated Vital Signs BP (!) 135/48   Pulse 64   Temp 98.1 F (36.7 C) (Oral)   Resp 15   SpO2 92%  Physical Exam Vitals and nursing note reviewed.  Constitutional:      General: She is not in acute distress.    Appearance: Normal appearance. She is not ill-appearing or  diaphoretic.  HENT:     Mouth/Throat:     Mouth: Mucous membranes are moist.     Pharynx: Oropharynx is clear.  Cardiovascular:     Rate and Rhythm: Normal rate and regular rhythm.     Pulses: Normal pulses.     Heart sounds: Normal heart sounds.  Pulmonary:     Effort: Pulmonary effort is normal. No respiratory distress.     Breath sounds: Normal breath sounds and air entry.  Abdominal:     General: Abdomen is flat. Bowel sounds are normal. There is no distension.     Palpations: Abdomen is soft.     Tenderness: There is no abdominal tenderness.  Musculoskeletal:     Right lower leg: No edema.     Left lower leg: No edema.  Skin:    General: Skin is warm and dry.     Capillary Refill: Capillary refill takes less than 2 seconds.     Coloration: Skin is not pale.  Neurological:     Mental Status: She is alert and oriented to person, place, and time. Mental status is at baseline.     GCS: GCS eye subscore is 4. GCS verbal subscore is 5. GCS motor subscore is 6.     Cranial Nerves: Cranial nerves 2-12 are intact.     Sensory: Sensation is intact.     Motor: Motor function is intact.     Coordination: Coordination is intact.     Gait: Gait is intact.     Comments: Cranial Nerves:  II: peripheral fields grossly intact III,IV, VI: ptosis not present, extra-ocular movements intact bilaterally, direct and consensual pupillary light reflexes intact bilaterally V: facial sensation, jaw opening, and bite strength equal bilaterally VII: eyebrow raise, eyelid close, smile, frown, pucker equal bilaterally VIII: hearing grossly normal bilaterally  IX,X: palate elevation and swallowing intact XI: bilateral shoulder shrug and lateral head rotation equal and strong XII: midline tongue extension Motor: 5/5 strength in bilateral upper and lower extremities with equal grip strength   Psychiatric:        Mood and Affect: Mood normal.        Behavior: Behavior normal.     ED Results /  Procedures / Treatments   Labs (all labs ordered are listed, but only abnormal results are displayed) Labs Reviewed  CBC WITH DIFFERENTIAL/PLATELET - Abnormal; Notable for the following components:      Result Value   RBC 3.24 (*)    HCT 34.4 (*)    MCV 106.2 (*)    MCH 37.3 (*)    RDW 16.5 (*)    nRBC 0.3 (*)    All other components within normal limits  COMPREHENSIVE METABOLIC PANEL - Abnormal; Notable for the following components:   Sodium 134 (*)    Glucose, Bld 131 (*)    All other components within normal limits  CBG MONITORING, ED - Abnormal; Notable for the following components:  Glucose-Capillary 130 (*)    All other components within normal limits  URINALYSIS, ROUTINE W REFLEX MICROSCOPIC  TSH    EKG EKG Interpretation  Date/Time:  Wednesday Jul 02 2022 10:31:10 EDT Ventricular Rate:  75 PR Interval:  124 QRS Duration: 60 QT Interval:  402 QTC Calculation: 448 R Axis:   81 Text Interpretation: Normal sinus rhythm Low voltage QRS r wave progression slightly delayedcompared to previous Abnormal ECG When compared with ECG of 29-May-2022 16:40, PREVIOUS ECG IS PRESENT Confirmed by Arby Barrette [id number]) on 07/02/2022 11:21:29 AM  Radiology DG Chest Portable 1 View  Addendum Date: 07/02/2022   ADDENDUM REPORT: 07/02/2022 14:51 ADDENDUM: Please disregard the previously dictated report as it was likely generated due to a technical error. FINDINGS: FINDINGS No pleural effusion. No pneumothorax. No focal airspace opacity. Normal cardiac and mediastinal contours. No focal airspace opacity. No radiographically apparent displaced rib fractures. Visualized upper abdomen is unremarkable. IMPRESSION: IMPRESSION No focal airspace opacity. Electronically Signed   By: Lorenza Cambridge M.D.   On: 07/02/2022 14:51   Result Date: 07/02/2022 CLINICAL DATA:  shortness of breath, weakness EXAM: PORTABLE CHEST 1 VIEW COMPARISON:  07/01/22 CXR FINDINGS: Endotracheal tube terminates approximately 3  cm above the carina. Enteric tube courses below the diaphragm with the side hole and tip projecting over the expected location of the stomach. There is a hazy opacity in the left mid lung field, which is new from prior and could represent infection. Rightward curvature of the thoracic spine. Visualized upper abdomen is unremarkable. Radiographically apparent new displaced rib fracture. Chronic lower right-sided rib fractures are redemonstrated. Chronic degenerative changes of the bilateral glenohumeral joint. IMPRESSION: 1. Hazy opacity in the left mid lung field is new from prior and could represent infection. 2. Radiographically apparent new displaced rib fracture. 3. Support apparatus as above. Electronically Signed: By: Lorenza Cambridge M.D. On: 07/02/2022 11:13    Procedures Procedures    Medications Ordered in ED Medications - No data to display  ED Course/ Medical Decision Making/ A&P                             Medical Decision Making Amount and/or Complexity of Data Reviewed Labs: ordered. Radiology: ordered.   This patient presents to the ED with chief complaint(s) of cough, shortness of breath, fatigue and malaise, weakness for several weeks with pertinent past medical history of hypertension, memory disorder, anxiety, atrial fibrillation.  The complaint involves an extensive differential diagnosis and also carries with it a high risk of complications and morbidity.    The differential diagnosis includes metabolic derangement, hyper or hypoglycemia, UTI, anemia, pneumonia, cardiac arrhythmia, hypothyroid  The initial plan is to obtain baseline labs, UA, TSH  Additional history obtained: Records reviewed  -patient was evaluated on 4//24 in ED for similar symptoms and was found to have a overall reassuring workup.  It does not appear that patient had a primary care follow-up after ED visit.  Initial Assessment:   Exam significant for an overall well-appearing 89 year old patient  who is not in acute distress.  Heart rate is normal in the 60s with regular rhythm.  Lungs are clear to auscultation bilaterally with adequate tidal volume.  Abdomen is soft and nontender to palpation.  Skin is warm and dry with good color.  Neuro exam is unremarkable.  Patient is able to ambulate without difficulty, however, she reports subjectively feeling weak and tired.  No increase in shortness  of breath when ambulating.    Independent ECG/labs interpretation:  The following labs were independently interpreted:  ECG demonstrates sinus rhythm without ectopy, ischemia, or infarction. CBC without significant anemia, or leukocytosis.  Metabolic panel with very mild hyponatremia, no other electrolyte disturbance.  Renal and hepatic function are both unremarkable.  CBG unremarkable.  UA without evidence of infection.  TSH within normal range.  Independent visualization and interpretation of imaging: I independently visualized the following imaging with scope of interpretation limited to determining acute life threatening conditions related to emergency care: Chest x-ray, which revealed no evidence of acute infection or active cardiopulmonary disease.    Treatment and Reassessment: Patient was ambulated with respiratory therapy and maintained an SpO2 of 94%, which quickly returned to upper 90s.  Patient walked with a steady gait and did not have increased shortness of breath.    Disposition:   Discussed with patient at length that I do not believe there is an emergent cause to her ongoing weakness, fatigue, and overall general malaise.  Suspect there may be some polypharmacy at affect.  Patient did state during our conversation that she feels fine upon waking, but then becomes really tired after she takes her medications.  Patient is on medication for blood pressure and atrial fibrillation.  She has had some recent changes in her medication, which may be a contributing factor.  Recommended to patient  that she schedule a follow-up appointment with her primary care doctor and her cardiologist.  Patient agrees to schedule appointment with her primary care doctor and will consider seeing her cardiologist if recommended by her primary care.    The patient has been appropriately medically screened and/or stabilized in the ED. I have low suspicion for any other emergent medical condition which would require further screening, evaluation or treatment in the ED or require inpatient management. At time of discharge the patient is hemodynamically stable and in no acute distress. I have discussed work-up results and diagnosis with patient and answered all questions. Patient is agreeable with discharge plan. We discussed strict return precautions for returning to the emergency department and they verbalized understanding.            Final Clinical Impression(s) / ED Diagnoses Final diagnoses:  Malaise and fatigue  Generalized weakness    Rx / DC Orders ED Discharge Orders     None         Lenard Simmer, PA-C 07/02/22 1530    Arby Barrette, MD 07/07/22 [phone]

## 2022-07-10 ENCOUNTER — Other Ambulatory Visit: Payer: Self-pay | Admitting: Cardiology

## 2022-10-21 ENCOUNTER — Ambulatory Visit: Payer: Medicare Other | Admitting: Cardiology

## 2022-10-31 ENCOUNTER — Encounter: Payer: Self-pay | Admitting: Cardiology

## 2022-10-31 ENCOUNTER — Ambulatory Visit: Payer: Medicare Other | Admitting: Cardiology

## 2022-10-31 VITALS — BP 137/61 | HR 68 | Resp 16 | Ht 63.0 in | Wt 126.0 lb

## 2022-10-31 DIAGNOSIS — I6522 Occlusion and stenosis of left carotid artery: Secondary | ICD-10-CM

## 2022-10-31 DIAGNOSIS — Z7901 Long term (current) use of anticoagulants: Secondary | ICD-10-CM

## 2022-10-31 DIAGNOSIS — I480 Paroxysmal atrial fibrillation: Secondary | ICD-10-CM

## 2022-10-31 DIAGNOSIS — Z79899 Other long term (current) drug therapy: Secondary | ICD-10-CM

## 2022-10-31 DIAGNOSIS — I10 Essential (primary) hypertension: Secondary | ICD-10-CM

## 2022-10-31 NOTE — Progress Notes (Signed)
ID:  Kristi Montgomery, DOB [birth year], MRN [id number]  PCP:  Creola Corn, MD  Cardiologist:  Tessa Lerner, DO, [facility] (established care 10/15/2021) Former Cardiology Providers: Dr. Nanetta Batty, Elvin So, [place]  Date: 10/31/22 Last Office Visit: 04/15/2022  Chief Complaint  Patient presents with   Paroxysmal A-fib [facility])    HPI  Kristi Montgomery is a 89 y.o. Caucasian female whose past medical history and cardiovascular risk factors include: Hypertension, chronic iron deficiency anemia, anxiety, GERD, former smoker, family history of premature CAD, history of COVID-19 infection, atrial fibrillation (diagnosed 09/2020).   Patient is being followed by the practice for paroxysmal atrial fibrillation.  During her last office visit in February 2024 I recommended transitioning from amiodarone to Multaq.  However she did not tolerate the medication well and she is now back on amiodarone 100 mg p.o. daily.  Clinically she denies anginal chest pain or heart failure symptoms.  She does endorse feeling tired and fatigue which is chronic and stable.  No hospitalizations or urgent care visits for cardiovascular reasons in the recent past and she does not endorse evidence of bleeding.  Most recent labs available in epic are from May 2024.  She recently had labs with PCP.  ALLERGIES: Allergies  Allergen Reactions   Amoxicillin Swelling and Other (See Comments)    Tongue swelling  Has patient had a PCN reaction causing immediate rash, facial/tongue/throat swelling, SOB or lightheadedness with hypotension: Yes Has patient had a PCN reaction causing severe rash involving mucus membranes or skin necrosis: Unknown Has patient had a PCN reaction that required hospitalization: Unknown Has patient had a PCN reaction occurring within the last 10 years: Unknown If all of the above answers are "NO", then may proceed with Cephalosporin use.    Amoxicillin-Pot Clavulanate Swelling and Other (See Comments)     Swollen and red tongue   Codeine Nausea And Vomiting   Latex Rash and Other (See Comments)    NO BAND-AIDS!!!!   Tape Rash and Other (See Comments)    NO BAND-AIDS OR TAPE!!!!    MEDICATION LIST PRIOR TO VISIT: Current Meds  Medication Sig   amiodarone (PACERONE) 100 MG tablet Take 1 tablet (100 mg total) by mouth daily.   amLODipine (NORVASC) 10 MG tablet Take 1 tablet (10 mg total) by mouth daily. (Patient taking differently: Take 10 mg by mouth at bedtime.)   apixaban (ELIQUIS) 2.5 MG TABS tablet Take 1 tablet (2.5 mg total) by mouth 2 (two) times daily.   donepezil (ARICEPT) 5 MG tablet Take 1 tablet by mouth 2 (two) times daily.   metoprolol succinate (TOPROL-XL) 25 MG 24 hr tablet Take 1 tablet (25 mg total) by mouth daily as needed (Palpitations/elevated heart rate and). Hold if systolic blood pressure (top number) less than 100 mmHg or pulse less than 60 bpm.   omeprazole (PRILOSEC) 40 MG capsule Take 40 mg by mouth daily as needed (heartburn).   Polyethyl Glycol-Propyl Glycol (SYSTANE OP) Place 1 drop into both eyes daily as needed (dry eyes).   spironolactone (ALDACTONE) 25 MG tablet TAKE 1/2 TABLET BY MOUTH DAILY   traZODone (DESYREL) 100 MG tablet Take 100 mg by mouth at bedtime.   zolpidem (AMBIEN) 10 MG tablet Take 5 mg by mouth at bedtime.   [DISCONTINUED] ALPRAZolam (XANAX) 0.25 MG tablet Take 0.125 mg by mouth 2 (two) times daily as needed for anxiety.     PAST MEDICAL HISTORY: Past Medical History:  Diagnosis Date   Anxiety  Cancer [facility])    Melanoma   Cataract    Chest pain    myoview 04/01/12-normal   History of tobacco abuse    HTN (hypertension)    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/06/2017    PAST SURGICAL HISTORY: Past Surgical History:  Procedure Laterality Date   CARDIOVERSION N/A 12/04/2020   Procedure: CARDIOVERSION;  Surgeon: Yates Decamp, MD;  Location: [facility] ENDOSCOPY;  Service: Cardiovascular;  Laterality: N/A;   CHOLECYSTECTOMY     MELANOMA EXCISION     x5     FAMILY HISTORY: The patient family history includes Cancer in her brother, brother, sister, and sister; Congestive Heart Failure in her mother; Heart Problems in her brother; Heart attack in her father; Heart failure in her mother.  SOCIAL HISTORY:  The patient  reports that she quit smoking about 34 years ago. Her smoking use included cigarettes. She has never used smokeless tobacco. She reports that she does not drink alcohol and does not use drugs.  REVIEW OF SYSTEMS: Review of Systems  Constitutional: Positive for malaise/fatigue.  Cardiovascular:  Negative for chest pain, claudication, dyspnea on exertion, irregular heartbeat, leg swelling, near-syncope, orthopnea, palpitations, paroxysmal nocturnal dyspnea and syncope.  Respiratory:  Negative for shortness of breath.   Hematologic/Lymphatic: Negative for bleeding problem.  Musculoskeletal:  Negative for muscle cramps and myalgias.  Neurological:  Negative for dizziness and light-headedness.    PHYSICAL EXAM:    10/31/2022    3:36 PM 07/02/2022    2:45 PM 07/02/2022    2:30 PM  Vitals with BMI  Height 5\' 3"     Weight 126 lbs    BMI 22.33    Systolic 137 135 962  Diastolic 61 48 57  Pulse 68 64 66    Physical Exam  Constitutional: No distress.  Age appropriate, hemodynamically stable.   Neck: No JVD present.  Cardiovascular: Normal rate, regular rhythm, S1 normal, S2 normal, intact distal pulses and normal pulses. Exam reveals no gallop, no S3 and no S4.  No murmur heard. Pulses:      Carotid pulses are  on the left side with bruit. Pulmonary/Chest: Effort normal and breath sounds normal. No stridor. She has no wheezes. She has no rales.  Abdominal: Soft. Bowel sounds are normal. She exhibits no distension. There is no abdominal tenderness.  Musculoskeletal:        General: No edema.     Cervical back: Neck supple.  Neurological: She is alert and oriented to person, place, and time. She has intact cranial nerves  (2-12).  Skin: Skin is warm and moist.   CARDIAC DATABASE: EKG: 10/31/2022: Sinus rhythm, 66 bpm, without underlying ischemia injury pattern  Echocardiogram: 02/28/2021: Normal LV systolic function with visual EF 60-65%. Left ventricle cavity is normal in size. Normal left ventricular wall thickness. Normal global wall motion. Doppler evidence of grade II (pseudonormal) diastolic dysfunction, elevated LAP. Moderate tricuspid regurgitation. Mild to moderate pulmonary hypertension. RVSP measures 44 mmHg. Compared to study 10/23/2020 Grade II DD and elevated LAP are new findings.   Stress Testing: 01/08/2021: Nondiagnostic ECG stress. Resting AF with RVR, no change with Lexiscan infusion. Myocardial perfusion is normal. Overall LV systolic function is normal without regional wall motion abnormalities. Stress LV EF: 42% although visually the LVEF is normal in size and function. No previous exam available for comparison. Low risk study.  Heart Catheterization: None  Carotid artery duplex 12/16/2021:  No evidence of significant stenosis in the right carotid vessels.  Duplex suggests stenosis in the left internal  carotid artery (1-15%). Mild homogeneous plaque noted.  Antegrade left vertebral artery flow.  Compared to 11/29/2020, left ICA stenosis of 50 to 49% not present.    Direct current cardioversion 12/04/2020 9:51 AM Indication symptomatic A. Fibrillation. Procedure: Using 50 mg of IV Propofol and 60 IV Lidocaine (for reducing venous pain) for achieving deep sedation, synchronized direct current cardioversion performed. Patient was delivered with 120 Joules of electricity X 1 with success to NSR. Patient tolerated the procedure well. No immediate complication noted.   LABORATORY DATA: Results Component Value Reference Range Notes  COMPLETE METABOLIC Reviewed date:08/02/2021 05:06:16 PM Interpretation: Performing Lab: Notes/Report:  GLUCOSE 124 60 - 110 mg/dl    BUN 14 5 - 23  mg/dl    CREATININE 0.8 0.3 - 1.5 mg/dl    eGFR Non-African American 67.7 <    eGFR African American 81.9 <    SODIUM 133 135 - 148 mEq/L    POTASSIUM 4.1 3.5 - 5.3 mEq/L    CHLORIDE 99 80 - 111 mEq/L    CO2 24 15 - 35 mEq/L    CALCIUM 8.0 7.0 - 10.5 mg/dL    TOTAL PROTEIN 6.5 6.0 - 8.5 g/dL    ALBUMIN 4.0 2.0 - 5.5 g/dL    AST 17 7 - 45 IU/L    ALT 16 5 - 40 IU/L    ALK PHOS 63 37 - 137 IU/L    TOTAL BILIRUBIN 1.0 0.0 - 1.5 mg/dl    CBC Reviewed ZHYQ:[id number] 05:06:16 PM Interpretation: Performing Lab: Notes/Report:  WBC 5.41 4.10 - 10.90 K/uL    LYM 1.5 0.6 - 4.1 K/uL    BASO% 2.0 0.0 - 2.0    EOS 0.1 0.0 - 0.4    BASO 0.1 0.0 - 0.2    EOS% 1.5 0.0 - 7.8    LYM% 27.7 10.0 - 58.5 %    RBC 2.9 4.2 - 6.3 M/uL    HGB 11.5 12.0 - 18.0 g/dL    HCT 29.5 28.4 - 13.2 %    MCV 108.5 80.0 - 97.0 fL    MCH 39.4 26.0 - 32.0 pg    MCHC 36.4 31.0 - 36.0 g/dL    RDW 44.0 10.2 - 72.5    PLT 221 140 - 440 K/uL    MPV 8.4 6.9 - 10.6    NEU 3.1 1.4 - 7.0    MONO% 11.3 4.6 - 12.4    MONO 0.6 0.1 - 0.9    NEU% 57.6 43.3 - 71.9    LIPID Reviewed date:08/02/2021 05:06:16 PM Interpretation: Performing Lab: Notes/Report:  CHOLESTEROL 146 100 - 200 mg/dl    TRIGLYCERIDES 54 30 - 149 mg/dl    HDL 52 40 - 60 mg/dl    LDL 83 < mg/dl    CHOL/HDL RATIO 2.8 < RATIO    LDL/HDL RATIO 1.6 1.7 - 2.5    NON-HDL 94.0 <    TSH Reviewed date:08/02/2021 05:06:16 PM Interpretation: Performing Lab: Notes/Report:  TSH 3.66 0.40 - 4.20 uIU/ml Third Generation hsTSH    IMPRESSION:    ICD-10-CM   1. Paroxysmal atrial fibrillation (HCC)  I48.0 EKG 12-Lead    2. Long term (current) use of anticoagulants  Z79.01     3. Long term current use of antiarrhythmic drug  Z79.899 Pulmonary function test    4. Atherosclerosis of left carotid artery  I65.22     5. Essential hypertension  I10        RECOMMENDATIONS: Kristi Montgomery is  a 89 y.o. Caucasian female whose past medical history and  cardiac risk factors include: Hypertension, chronic iron deficiency anemia, anxiety, GERD, former smoker, family history of premature CAD, history of COVID-19 infection, paroxysmal atrial fibrillation (diagnosed 09/2020).   Paroxysmal A-fib (HCC) Rate control: Metoprolol. Rhythm control: Amiodarone Thromboembolic prophylaxis: Eliquis. CHA2DS2-VASc SCORE is 4 which correlates to approximately 4.8% risk of stroke per year (HTN, age, gender).  History of cardioversion x1 (October 2022). Did not tolerate Multaq in the past. She had a chest x-ray in May 2024. Recommended repeat labs to check hemoglobin, AST ALT, and TSH given the fact he is on amiodarone.  Patient refuses and states that she will have her PCP labs forwarded to [place] for reference.  Will await results. Check PFTs Patient is made aware the importance of monitoring for side effect given the fact that she is on amiodarone.  Essential hypertension Office blood pressures are well-controlled. Medications reconciled. No changes warranted at this time  Atherosclerosis of left carotid artery Currently asymptomatic. Noted to have atherosclerosis involving the left ICA.  Overall disease burden is less than 15%.   Continue to monitor clinically.  FINAL MEDICATION LIST END OF ENCOUNTER: No orders of the defined types were placed in this encounter.   Medications Discontinued During This Encounter  Medication Reason   ALPRAZolam (XANAX) 0.25 MG tablet    furosemide (LASIX) 40 MG tablet      Current Outpatient Medications:    amiodarone (PACERONE) 100 MG tablet, Take 1 tablet (100 mg total) by mouth daily., Disp: 30 tablet, Rfl: 2   amLODipine (NORVASC) 10 MG tablet, Take 1 tablet (10 mg total) by mouth daily. (Patient taking differently: Take 10 mg by mouth at bedtime.), Disp: 90 tablet, Rfl: 3   apixaban (ELIQUIS) 2.5 MG TABS tablet, Take 1 tablet (2.5 mg total) by mouth 2 (two) times daily., Disp: 60 tablet, Rfl: 0   donepezil  (ARICEPT) 5 MG tablet, Take 1 tablet by mouth 2 (two) times daily., Disp: , Rfl:    metoprolol succinate (TOPROL-XL) 25 MG 24 hr tablet, Take 1 tablet (25 mg total) by mouth daily as needed (Palpitations/elevated heart rate and). Hold if systolic blood pressure (top number) less than 100 mmHg or pulse less than 60 bpm., Disp: 90 tablet, Rfl: 1   omeprazole (PRILOSEC) 40 MG capsule, Take 40 mg by mouth daily as needed (heartburn)., Disp: , Rfl:    Polyethyl Glycol-Propyl Glycol (SYSTANE OP), Place 1 drop into both eyes daily as needed (dry eyes)., Disp: , Rfl:    spironolactone (ALDACTONE) 25 MG tablet, TAKE 1/2 TABLET BY MOUTH DAILY, Disp: 30 tablet, Rfl: 0   traZODone (DESYREL) 100 MG tablet, Take 100 mg by mouth at bedtime., Disp: , Rfl:    zolpidem (AMBIEN) 10 MG tablet, Take 5 mg by mouth at bedtime., Disp: , Rfl:    cyclobenzaprine (FLEXERIL) 5 MG tablet, Take 5 mg by mouth at bedtime., Disp: , Rfl:    traMADol (ULTRAM) 50 MG tablet, Take 50 mg by mouth every 6 (six) hours as needed., Disp: , Rfl:   Orders Placed This Encounter  Procedures   EKG 12-Lead   Pulmonary function test    There are no Patient Instructions on file for this visit.   --Continue cardiac medications as reconciled in final medication list. --Return in about 6 months (around 04/30/2023) for Follow up, A. fib. or sooner if needed. --Continue follow-up with your primary care physician regarding the management of your other chronic comorbid  conditions.  Patient's questions and concerns were addressed to her satisfaction. She voices understanding of the instructions provided during this encounter.   This note was created using a voice recognition software as a result there may be grammatical errors inadvertently enclosed that do not reflect the nature of this encounter. Every attempt is made to correct such errors.   Tessa Lerner, [place], [facility]  Pager:  [phone] Office: [phone]

## 2022-11-01 ENCOUNTER — Other Ambulatory Visit: Payer: Self-pay | Admitting: Cardiology

## 2022-11-13 NOTE — Telephone Encounter (Signed)
Error message

## 2022-11-18 ENCOUNTER — Other Ambulatory Visit: Payer: Self-pay | Admitting: Cardiology

## 2022-11-18 DIAGNOSIS — I480 Paroxysmal atrial fibrillation: Secondary | ICD-10-CM

## 2022-12-05 DIAGNOSIS — J811 Chronic pulmonary edema: Secondary | ICD-10-CM | POA: Insufficient documentation

## 2023-01-21 ENCOUNTER — Encounter: Payer: Self-pay | Admitting: Pulmonary Disease

## 2023-01-21 ENCOUNTER — Ambulatory Visit: Payer: Medicare Other | Admitting: Pulmonary Disease

## 2023-01-21 VITALS — BP 124/52 | HR 75 | Ht 63.0 in | Wt 123.0 lb

## 2023-01-21 DIAGNOSIS — D509 Iron deficiency anemia, unspecified: Secondary | ICD-10-CM | POA: Insufficient documentation

## 2023-01-21 DIAGNOSIS — K219 Gastro-esophageal reflux disease without esophagitis: Secondary | ICD-10-CM | POA: Insufficient documentation

## 2023-01-21 DIAGNOSIS — J810 Acute pulmonary edema: Secondary | ICD-10-CM | POA: Diagnosis not present

## 2023-01-21 DIAGNOSIS — K529 Noninfective gastroenteritis and colitis, unspecified: Secondary | ICD-10-CM | POA: Insufficient documentation

## 2023-01-21 DIAGNOSIS — J90 Pleural effusion, not elsewhere classified: Secondary | ICD-10-CM | POA: Diagnosis not present

## 2023-01-21 DIAGNOSIS — I110 Hypertensive heart disease with heart failure: Secondary | ICD-10-CM | POA: Insufficient documentation

## 2023-01-21 DIAGNOSIS — Z9049 Acquired absence of other specified parts of digestive tract: Secondary | ICD-10-CM | POA: Insufficient documentation

## 2023-01-21 DIAGNOSIS — D6869 Other thrombophilia: Secondary | ICD-10-CM | POA: Insufficient documentation

## 2023-01-21 DIAGNOSIS — R5382 Chronic fatigue, unspecified: Secondary | ICD-10-CM | POA: Insufficient documentation

## 2023-01-21 NOTE — Patient Instructions (Signed)
It is nice to meet you  I am glad you are doing better  Based on the report of the CT scan and improvement with IV diuretics, almost certainly the issue in the hospital was fluid buildup on the lungs.  This is gotten better.  I agree with staying off the amiodarone.  We have no evidence that this is causing long-term lung damage.  But there is a risk of this in the future if we continue.  Consider follow-up with a cardiologist, either Dr. Odis Hollingshead or requested In the future for further evaluation as needed.  Return to clinic as needed

## 2023-01-21 NOTE — Progress Notes (Signed)
@Patient  ID: Kristi Montgomery, female    DOB: 1930-08-28, 89 y.o.   MRN: [id number]  Chief Complaint  Patient presents with   Establish Care    Abn chest CT    Referring provider: Creola Corn, MD  HPI:   89 y.o. woman whom we are seeing for evaluation of abnormal CT scan.  Hospital follow-up.  Multiple notes from PCP reviewed.  Hospital notes on paper records brought by patient reviewed.  Patient was in the hospital 11/2022.  Hypoxemia and shortness of breath.  CT scan was performed which shows "bilateral groundglass airspace disease with septal thickening and bilateral pleural effusions."  She was diuresed with IV Lasix.  She improved.  Hypoxemia resolved.  She was discharged without oxygen.  It seems like there is some discussion of may be an infectious or viral etiology.  This seems unlikely based on the pattern of disease.  Cardiologist they are on consultation subcu to stop amiodarone.  She has since.  Unclear inciting event but seems like A-fib with RVR.  The patient says no.  But other notes indicate that A-fib with RVR was an issue.  So this daughter at bedside.  Since discharge she completed her Lasix course as prescribed on discharge.  It was not refilled by her PCP per her report.  She is no longer taking Lasix.  She had echocardiogram in the hospital it is indicated elevated pulmonary pressures.  Her most recent TTE in 08/2021 indicated the same with severe grade 2 diastolic dysfunction.  Discussed hospitalization and issues with breathing.  All have resolved.  She is back to baseline.  Back to normal stamina function etc.  Taking care of her household etc.  Denies dyspnea on exertion.  We discussed at length the cardiac nature of the abnormality on her CT scan.  Discussed at length strategies to prevent this in the future.  Discussed given her lack of symptoms, feeling normal, no need for additional images or other testing.    Questionaires / Pulmonary Flowsheets:   ACT:       No data to display          MMRC:     No data to display          Epworth:      No data to display          Tests:   FENO:  No results found for: "NITRICOXIDE"  PFT:     No data to display          WALK:      No data to display          Imaging: Personally reviewed and as per EMR and discussion in this note No results found.  Lab Results: Personally reviewed CBC    Component Value Date/Time   WBC 7.2 07/02/2022 1032   RBC 3.24 (L) 07/02/2022 1032   HGB 12.1 07/02/2022 1032   HGB 12.3 04/23/2022 1335   HCT 34.4 (L) 07/02/2022 1032   HCT 35.1 04/23/2022 1335   PLT 262 07/02/2022 1032   PLT 257 04/23/2022 1335   MCV 106.2 (H) 07/02/2022 1032   MCV 105 (H) 04/23/2022 1335   MCH 37.3 (H) 07/02/2022 1032   MCHC 35.2 07/02/2022 1032   RDW 16.5 (H) 07/02/2022 1032   RDW 15.3 04/23/2022 1335   LYMPHSABS 1.1 07/02/2022 1032   LYMPHSABS 1.4 04/23/2022 1335   MONOABS 0.7 07/02/2022 1032   EOSABS 0.1 07/02/2022 1032   EOSABS 0.1 04/23/2022  1335   BASOSABS 0.1 07/02/2022 1032   BASOSABS 0.1 04/23/2022 1335    BMET    Component Value Date/Time   NA 134 (L) 07/02/2022 1032   NA 138 05/12/2022 1137   K 3.7 07/02/2022 1032   CL 99 07/02/2022 1032   CO2 26 07/02/2022 1032   GLUCOSE 131 (H) 07/02/2022 1032   BUN 15 07/02/2022 1032   BUN 12 05/12/2022 1137   CREATININE 0.77 07/02/2022 1032   CALCIUM 9.5 07/02/2022 1032   GFRNONAA >60 07/02/2022 1032   GFRAA >60 01/05/2018 1305    BNP    Component Value Date/Time   BNP 308.4 (H) 02/07/2021 1211    ProBNP    Component Value Date/Time   PROBNP 2,029 (H) 01/31/2021 1316    Specialty Problems       Pulmonary Problems   Diaphragmatic hernia   Allergic rhinitis   Pulmonary edema   Pleural effusion    Allergies  Allergen Reactions   Amoxicillin Swelling and Other (See Comments)    Tongue swelling  Has patient had a PCN reaction causing immediate rash, facial/tongue/throat  swelling, SOB or lightheadedness with hypotension: Yes Has patient had a PCN reaction causing severe rash involving mucus membranes or skin necrosis: Unknown Has patient had a PCN reaction that required hospitalization: Unknown Has patient had a PCN reaction occurring within the last 10 years: Unknown If all of the above answers are "NO", then may proceed with Cephalosporin use.    Amoxicillin-Pot Clavulanate Swelling and Other (See Comments)    Swollen and red tongue   Codeine Nausea And Vomiting   Latex Rash and Other (See Comments)    NO BAND-AIDS!!!!   Tape Rash and Other (See Comments)    NO BAND-AIDS OR TAPE!!!!    Immunization History  Administered Date(s) Administered   Influenza Split 12/27/2008, 01/07/2010, 01/15/2011, 01/26/2012, 02/01/2013, 12/22/2013   Influenza, High Dose Seasonal PF 01/16/2020   Influenza, Quadrivalent, Recombinant, Inj, Pf 11/20/2017, 11/29/2018   Influenza,inj,Quad PF,6+ Mos 12/22/2013, 02/01/2015   Influenza-Unspecified 11/24/2020   PFIZER(Purple Top)SARS-COV-2 Vaccination 04/16/2019, 05/10/2019, 01/11/2020   Pneumococcal Conjugate-13 02/01/2013   Pneumococcal Polysaccharide-23 02/24/2005, 04/07/2016   Td (Adult),5 Lf Tetanus Toxid, Preservative Free 02/25/2003, 08/02/2013   Tdap 02/02/2014    Past Medical History:  Diagnosis Date   Anxiety    Cancer (HCC)    Melanoma   Cataract    Chest pain    myoview 04/01/12-normal   History of tobacco abuse    HTN (hypertension)    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/06/2017    Tobacco History: Social History   Tobacco Use  Smoking Status Former   Current packs/day: 0.00   Types: Cigarettes   Quit date: 02/25/1988   Years since quitting: 34.9  Smokeless Tobacco Never   Counseling given: Not Answered   Continue to not smoke  Outpatient Encounter Medications as of 01/21/2023  Medication Sig   apixaban (ELIQUIS) 2.5 MG TABS tablet Take 1 tablet (2.5 mg total) by mouth 2 (two) times daily.   cyclobenzaprine  (FLEXERIL) 5 MG tablet Take 5 mg by mouth at bedtime.   donepezil (ARICEPT) 5 MG tablet Take 1 tablet by mouth 2 (two) times daily.   metoprolol succinate (TOPROL-XL) 25 MG 24 hr tablet Take 1 tablet by mouth every evening. Take with or immediately following a meal.   omeprazole (PRILOSEC) 40 MG capsule Take 40 mg by mouth daily as needed (heartburn).   Polyethyl Glycol-Propyl Glycol (SYSTANE OP) Place 1 drop into both eyes  daily as needed (dry eyes).   spironolactone (ALDACTONE) 25 MG tablet TAKE 1/2 TABLET BY MOUTH DAILY   traMADol (ULTRAM) 50 MG tablet Take 50 mg by mouth every 6 (six) hours as needed.   traZODone (DESYREL) 100 MG tablet Take 100 mg by mouth at bedtime.   zolpidem (AMBIEN) 10 MG tablet Take 5 mg by mouth at bedtime.   [DISCONTINUED] furosemide (LASIX) 20 MG tablet Take 20 mg by mouth daily as needed.   [DISCONTINUED] furosemide (LASIX) 40 MG tablet Take 40 mg by mouth daily.   amLODipine (NORVASC) 10 MG tablet Take 1 tablet (10 mg total) by mouth daily. (Patient taking differently: Take 10 mg by mouth at bedtime.)   [DISCONTINUED] amiodarone (PACERONE) 100 MG tablet Take 1 tablet (100 mg total) by mouth daily.   [DISCONTINUED] amiodarone (PACERONE) 200 MG tablet Take 200 mg by mouth daily. (Patient not taking: Reported on 01/21/2023)   No facility-administered encounter medications on file as of 01/21/2023.     Review of Systems  Review of Systems  No chest pain with exertion.  No orthopnea or PND.  Comprehensive review of systems otherwise negative. Physical Exam  BP (!) 124/52   Pulse 75   Ht 5\' 3"  (1.6 m)   Wt 123 lb (55.8 kg)   SpO2 97%   BMI 21.79 kg/m   Wt Readings from Last 5 Encounters:  01/21/23 123 lb (55.8 kg)  10/31/22 126 lb (57.2 kg)  04/15/22 129 lb (58.5 kg)  02/26/22 130 lb (59 kg)  10/15/21 131 lb 12.8 oz (59.8 kg)    BMI Readings from Last 5 Encounters:  01/21/23 21.79 kg/m  10/31/22 22.32 kg/m  04/15/22 22.85 kg/m  02/26/22 23.03  kg/m  10/15/21 23.35 kg/m     Physical Exam General: Sitting in chair, no acute distress Eyes: EOMI, no icterus Neck: Supple, no JVP Pulmonary: Clear, normal work of breathing Cardiovascular: Warm, no edema, regular rate and rhythm Abdomen: Nondistended, bowel sounds present MSK: No synovitis, no joint effusion Neuro: Normal gait, no weakness Psych: Normal mood, full affect   Assessment & Plan:   Abnormal CT scan: CT described as interlobular septal thickening, groundglass and bilateral pleural effusions.  Improved with IV diuresis interval hypoxemia and dyspnea.  Pattern and clinical response most consistent with cardiogenic volume overload and pulmonary venous hypertension.  Unclear inciting event.  Suspect uncontrolled or A-fib or A-fib with RVR flare.  She denies this in the hospital.  Daughter at bedside indicates this was the issue.  Regardless, this is not a pulmonary issue or cardiac issue.  No further imaging quired or recommended.  She denies any dyspnea.  She is back to her normal baseline of breathing.  No further testing such as PFTs etc. at this time.  No sign of clinical indication of amiodarone toxicity although relatively reasonable to stay off this medication in the future to prevent development of this in the future.  Can reconsider in the future if symptoms develop.  Recommend she follow-up with cardiology given these issues.   Return if symptoms worsen or fail to improve.   Karren Burly, MD 01/21/2023   This appointment required 61 minutes of patient care (this includes precharting, chart review, review of results, face-to-face care, etc.).

## 2023-02-05 ENCOUNTER — Encounter: Payer: Self-pay | Admitting: Pulmonary Disease

## 2023-03-23 ENCOUNTER — Other Ambulatory Visit: Payer: Self-pay | Admitting: Cardiology

## 2023-03-24 DIAGNOSIS — N182 Chronic kidney disease, stage 2 (mild): Secondary | ICD-10-CM | POA: Diagnosis not present

## 2023-03-24 DIAGNOSIS — E785 Hyperlipidemia, unspecified: Secondary | ICD-10-CM | POA: Diagnosis not present

## 2023-03-24 DIAGNOSIS — R7301 Impaired fasting glucose: Secondary | ICD-10-CM | POA: Diagnosis not present

## 2023-03-24 DIAGNOSIS — F03A4 Unspecified dementia, mild, with anxiety: Secondary | ICD-10-CM | POA: Diagnosis not present

## 2023-03-24 DIAGNOSIS — J90 Pleural effusion, not elsewhere classified: Secondary | ICD-10-CM | POA: Diagnosis not present

## 2023-03-24 DIAGNOSIS — M1990 Unspecified osteoarthritis, unspecified site: Secondary | ICD-10-CM | POA: Diagnosis not present

## 2023-03-24 DIAGNOSIS — I480 Paroxysmal atrial fibrillation: Secondary | ICD-10-CM | POA: Diagnosis not present

## 2023-03-24 DIAGNOSIS — G245 Blepharospasm: Secondary | ICD-10-CM | POA: Diagnosis not present

## 2023-03-24 DIAGNOSIS — R5382 Chronic fatigue, unspecified: Secondary | ICD-10-CM | POA: Diagnosis not present

## 2023-03-24 DIAGNOSIS — I129 Hypertensive chronic kidney disease with stage 1 through stage 4 chronic kidney disease, or unspecified chronic kidney disease: Secondary | ICD-10-CM | POA: Diagnosis not present

## 2023-03-24 DIAGNOSIS — R627 Adult failure to thrive: Secondary | ICD-10-CM | POA: Diagnosis not present

## 2023-03-24 DIAGNOSIS — I272 Pulmonary hypertension, unspecified: Secondary | ICD-10-CM | POA: Diagnosis not present

## 2023-04-08 DIAGNOSIS — H02421 Myogenic ptosis of right eyelid: Secondary | ICD-10-CM | POA: Diagnosis not present

## 2023-04-08 DIAGNOSIS — H02412 Mechanical ptosis of left eyelid: Secondary | ICD-10-CM | POA: Diagnosis not present

## 2023-04-08 DIAGNOSIS — H02423 Myogenic ptosis of bilateral eyelids: Secondary | ICD-10-CM | POA: Diagnosis not present

## 2023-04-08 DIAGNOSIS — H02831 Dermatochalasis of right upper eyelid: Secondary | ICD-10-CM | POA: Diagnosis not present

## 2023-04-08 DIAGNOSIS — H02411 Mechanical ptosis of right eyelid: Secondary | ICD-10-CM | POA: Diagnosis not present

## 2023-04-08 DIAGNOSIS — G245 Blepharospasm: Secondary | ICD-10-CM | POA: Diagnosis not present

## 2023-04-08 DIAGNOSIS — H02413 Mechanical ptosis of bilateral eyelids: Secondary | ICD-10-CM | POA: Diagnosis not present

## 2023-04-08 DIAGNOSIS — H0279 Other degenerative disorders of eyelid and periocular area: Secondary | ICD-10-CM | POA: Diagnosis not present

## 2023-04-08 DIAGNOSIS — H57813 Brow ptosis, bilateral: Secondary | ICD-10-CM | POA: Diagnosis not present

## 2023-04-08 DIAGNOSIS — H02834 Dermatochalasis of left upper eyelid: Secondary | ICD-10-CM | POA: Diagnosis not present

## 2023-04-08 DIAGNOSIS — H02422 Myogenic ptosis of left eyelid: Secondary | ICD-10-CM | POA: Diagnosis not present

## 2023-04-08 DIAGNOSIS — H53483 Generalized contraction of visual field, bilateral: Secondary | ICD-10-CM | POA: Diagnosis not present

## 2023-04-28 DIAGNOSIS — G244 Idiopathic orofacial dystonia: Secondary | ICD-10-CM | POA: Diagnosis not present

## 2023-04-28 DIAGNOSIS — G245 Blepharospasm: Secondary | ICD-10-CM | POA: Diagnosis not present

## 2023-04-28 DIAGNOSIS — H0279 Other degenerative disorders of eyelid and periocular area: Secondary | ICD-10-CM | POA: Diagnosis not present

## 2023-04-28 DIAGNOSIS — G5133 Clonic hemifacial spasm, bilateral: Secondary | ICD-10-CM | POA: Diagnosis not present

## 2023-04-28 DIAGNOSIS — G5131 Clonic hemifacial spasm, right: Secondary | ICD-10-CM | POA: Diagnosis not present

## 2023-04-28 DIAGNOSIS — G5132 Clonic hemifacial spasm, left: Secondary | ICD-10-CM | POA: Diagnosis not present

## 2023-05-04 DIAGNOSIS — H53483 Generalized contraction of visual field, bilateral: Secondary | ICD-10-CM | POA: Diagnosis not present

## 2023-05-07 ENCOUNTER — Ambulatory Visit: Payer: Self-pay | Admitting: Cardiology

## 2023-05-18 ENCOUNTER — Ambulatory Visit: Admitting: Cardiology

## 2023-05-18 ENCOUNTER — Other Ambulatory Visit: Payer: Self-pay | Admitting: Internal Medicine

## 2023-05-18 DIAGNOSIS — Z1231 Encounter for screening mammogram for malignant neoplasm of breast: Secondary | ICD-10-CM

## 2023-05-20 DIAGNOSIS — F02A4 Dementia in other diseases classified elsewhere, mild, with anxiety: Secondary | ICD-10-CM | POA: Diagnosis not present

## 2023-05-20 DIAGNOSIS — F419 Anxiety disorder, unspecified: Secondary | ICD-10-CM | POA: Diagnosis not present

## 2023-05-20 DIAGNOSIS — F02A3 Dementia in other diseases classified elsewhere, mild, with mood disturbance: Secondary | ICD-10-CM | POA: Diagnosis not present

## 2023-05-20 DIAGNOSIS — I700 Atherosclerosis of aorta: Secondary | ICD-10-CM | POA: Diagnosis not present

## 2023-05-20 DIAGNOSIS — G4700 Insomnia, unspecified: Secondary | ICD-10-CM | POA: Diagnosis not present

## 2023-05-20 DIAGNOSIS — G620 Drug-induced polyneuropathy: Secondary | ICD-10-CM | POA: Diagnosis not present

## 2023-05-20 DIAGNOSIS — F3341 Major depressive disorder, recurrent, in partial remission: Secondary | ICD-10-CM | POA: Diagnosis not present

## 2023-05-20 DIAGNOSIS — I4891 Unspecified atrial fibrillation: Secondary | ICD-10-CM | POA: Diagnosis not present

## 2023-05-20 DIAGNOSIS — D6869 Other thrombophilia: Secondary | ICD-10-CM | POA: Diagnosis not present

## 2023-05-20 DIAGNOSIS — D509 Iron deficiency anemia, unspecified: Secondary | ICD-10-CM | POA: Diagnosis not present

## 2023-05-20 DIAGNOSIS — E785 Hyperlipidemia, unspecified: Secondary | ICD-10-CM | POA: Diagnosis not present

## 2023-05-20 DIAGNOSIS — G309 Alzheimer's disease, unspecified: Secondary | ICD-10-CM | POA: Diagnosis not present

## 2023-05-22 DIAGNOSIS — Z7901 Long term (current) use of anticoagulants: Secondary | ICD-10-CM | POA: Diagnosis not present

## 2023-05-22 DIAGNOSIS — I130 Hypertensive heart and chronic kidney disease with heart failure and stage 1 through stage 4 chronic kidney disease, or unspecified chronic kidney disease: Secondary | ICD-10-CM | POA: Diagnosis not present

## 2023-05-22 DIAGNOSIS — R42 Dizziness and giddiness: Secondary | ICD-10-CM | POA: Diagnosis not present

## 2023-05-22 DIAGNOSIS — H8113 Benign paroxysmal vertigo, bilateral: Secondary | ICD-10-CM | POA: Diagnosis not present

## 2023-05-22 DIAGNOSIS — I480 Paroxysmal atrial fibrillation: Secondary | ICD-10-CM | POA: Diagnosis not present

## 2023-05-22 DIAGNOSIS — R002 Palpitations: Secondary | ICD-10-CM | POA: Diagnosis not present

## 2023-05-22 DIAGNOSIS — I129 Hypertensive chronic kidney disease with stage 1 through stage 4 chronic kidney disease, or unspecified chronic kidney disease: Secondary | ICD-10-CM | POA: Diagnosis not present

## 2023-05-22 DIAGNOSIS — I5032 Chronic diastolic (congestive) heart failure: Secondary | ICD-10-CM | POA: Diagnosis not present

## 2023-05-22 DIAGNOSIS — I272 Pulmonary hypertension, unspecified: Secondary | ICD-10-CM | POA: Diagnosis not present

## 2023-05-22 DIAGNOSIS — J90 Pleural effusion, not elsewhere classified: Secondary | ICD-10-CM | POA: Diagnosis not present

## 2023-05-22 DIAGNOSIS — N182 Chronic kidney disease, stage 2 (mild): Secondary | ICD-10-CM | POA: Diagnosis not present

## 2023-05-22 DIAGNOSIS — F03A4 Unspecified dementia, mild, with anxiety: Secondary | ICD-10-CM | POA: Diagnosis not present

## 2023-05-22 DIAGNOSIS — R531 Weakness: Secondary | ICD-10-CM | POA: Diagnosis not present

## 2023-05-27 DIAGNOSIS — H57813 Brow ptosis, bilateral: Secondary | ICD-10-CM | POA: Diagnosis not present

## 2023-05-27 DIAGNOSIS — H02831 Dermatochalasis of right upper eyelid: Secondary | ICD-10-CM | POA: Diagnosis not present

## 2023-05-27 DIAGNOSIS — H02413 Mechanical ptosis of bilateral eyelids: Secondary | ICD-10-CM | POA: Diagnosis not present

## 2023-05-27 DIAGNOSIS — H02411 Mechanical ptosis of right eyelid: Secondary | ICD-10-CM | POA: Diagnosis not present

## 2023-05-27 DIAGNOSIS — G245 Blepharospasm: Secondary | ICD-10-CM | POA: Diagnosis not present

## 2023-05-27 DIAGNOSIS — H02422 Myogenic ptosis of left eyelid: Secondary | ICD-10-CM | POA: Diagnosis not present

## 2023-05-27 DIAGNOSIS — H02421 Myogenic ptosis of right eyelid: Secondary | ICD-10-CM | POA: Diagnosis not present

## 2023-05-27 DIAGNOSIS — H02412 Mechanical ptosis of left eyelid: Secondary | ICD-10-CM | POA: Diagnosis not present

## 2023-05-27 DIAGNOSIS — H0279 Other degenerative disorders of eyelid and periocular area: Secondary | ICD-10-CM | POA: Diagnosis not present

## 2023-05-27 DIAGNOSIS — Z09 Encounter for follow-up examination after completed treatment for conditions other than malignant neoplasm: Secondary | ICD-10-CM | POA: Diagnosis not present

## 2023-05-27 DIAGNOSIS — H02834 Dermatochalasis of left upper eyelid: Secondary | ICD-10-CM | POA: Diagnosis not present

## 2023-05-27 DIAGNOSIS — H02423 Myogenic ptosis of bilateral eyelids: Secondary | ICD-10-CM | POA: Diagnosis not present

## 2023-06-02 DIAGNOSIS — F329 Major depressive disorder, single episode, unspecified: Secondary | ICD-10-CM | POA: Diagnosis not present

## 2023-06-02 DIAGNOSIS — R531 Weakness: Secondary | ICD-10-CM | POA: Diagnosis not present

## 2023-06-02 DIAGNOSIS — F03A4 Unspecified dementia, mild, with anxiety: Secondary | ICD-10-CM | POA: Diagnosis not present

## 2023-06-02 DIAGNOSIS — F419 Anxiety disorder, unspecified: Secondary | ICD-10-CM | POA: Diagnosis not present

## 2023-06-02 DIAGNOSIS — N182 Chronic kidney disease, stage 2 (mild): Secondary | ICD-10-CM | POA: Diagnosis not present

## 2023-06-02 DIAGNOSIS — I129 Hypertensive chronic kidney disease with stage 1 through stage 4 chronic kidney disease, or unspecified chronic kidney disease: Secondary | ICD-10-CM | POA: Diagnosis not present

## 2023-06-02 DIAGNOSIS — I5032 Chronic diastolic (congestive) heart failure: Secondary | ICD-10-CM | POA: Diagnosis not present

## 2023-06-02 DIAGNOSIS — H10502 Unspecified blepharoconjunctivitis, left eye: Secondary | ICD-10-CM | POA: Diagnosis not present

## 2023-06-02 DIAGNOSIS — I480 Paroxysmal atrial fibrillation: Secondary | ICD-10-CM | POA: Diagnosis not present

## 2023-06-02 DIAGNOSIS — S80812A Abrasion, left lower leg, initial encounter: Secondary | ICD-10-CM | POA: Diagnosis not present

## 2023-06-02 DIAGNOSIS — I272 Pulmonary hypertension, unspecified: Secondary | ICD-10-CM | POA: Diagnosis not present

## 2023-06-09 ENCOUNTER — Ambulatory Visit
Admission: RE | Admit: 2023-06-09 | Discharge: 2023-06-09 | Disposition: A | Discharge status: Home / Self Care | Source: Ambulatory Visit | Attending: Internal Medicine | Admitting: Internal Medicine

## 2023-06-09 DIAGNOSIS — Z1231 Encounter for screening mammogram for malignant neoplasm of breast: Secondary | ICD-10-CM | POA: Diagnosis not present

## 2023-06-11 DIAGNOSIS — F329 Major depressive disorder, single episode, unspecified: Secondary | ICD-10-CM | POA: Diagnosis not present

## 2023-06-11 DIAGNOSIS — N182 Chronic kidney disease, stage 2 (mild): Secondary | ICD-10-CM | POA: Diagnosis not present

## 2023-06-11 DIAGNOSIS — F03A4 Unspecified dementia, mild, with anxiety: Secondary | ICD-10-CM | POA: Diagnosis not present

## 2023-06-11 DIAGNOSIS — I5032 Chronic diastolic (congestive) heart failure: Secondary | ICD-10-CM | POA: Diagnosis not present

## 2023-06-11 DIAGNOSIS — R531 Weakness: Secondary | ICD-10-CM | POA: Diagnosis not present

## 2023-06-11 DIAGNOSIS — I272 Pulmonary hypertension, unspecified: Secondary | ICD-10-CM | POA: Diagnosis not present

## 2023-06-11 DIAGNOSIS — I130 Hypertensive heart and chronic kidney disease with heart failure and stage 1 through stage 4 chronic kidney disease, or unspecified chronic kidney disease: Secondary | ICD-10-CM | POA: Diagnosis not present

## 2023-06-11 DIAGNOSIS — S80812A Abrasion, left lower leg, initial encounter: Secondary | ICD-10-CM | POA: Diagnosis not present

## 2023-06-11 DIAGNOSIS — I480 Paroxysmal atrial fibrillation: Secondary | ICD-10-CM | POA: Diagnosis not present

## 2023-06-11 DIAGNOSIS — F419 Anxiety disorder, unspecified: Secondary | ICD-10-CM | POA: Diagnosis not present

## 2023-06-12 ENCOUNTER — Other Ambulatory Visit: Payer: Self-pay | Admitting: Internal Medicine

## 2023-06-12 DIAGNOSIS — R928 Other abnormal and inconclusive findings on diagnostic imaging of breast: Secondary | ICD-10-CM

## 2023-06-23 ENCOUNTER — Ambulatory Visit
Admission: RE | Admit: 2023-06-23 | Discharge: 2023-06-23 | Disposition: A | Discharge status: Home / Self Care | Source: Ambulatory Visit | Attending: Internal Medicine | Admitting: Internal Medicine

## 2023-06-23 DIAGNOSIS — R928 Other abnormal and inconclusive findings on diagnostic imaging of breast: Secondary | ICD-10-CM

## 2023-06-23 DIAGNOSIS — R921 Mammographic calcification found on diagnostic imaging of breast: Secondary | ICD-10-CM | POA: Diagnosis not present

## 2023-06-24 ENCOUNTER — Other Ambulatory Visit: Payer: Self-pay | Admitting: Internal Medicine

## 2023-06-24 DIAGNOSIS — R921 Mammographic calcification found on diagnostic imaging of breast: Secondary | ICD-10-CM

## 2023-07-02 ENCOUNTER — Telehealth: Payer: Self-pay | Admitting: *Deleted

## 2023-07-02 NOTE — Telephone Encounter (Signed)
   Pre-operative Risk Assessment    Patient Name: Kristi Montgomery  DOB: 1930/07/29 MRN: [id number]   Date of last office visit: 10/31/2022 Date of next office visit: N/A   Request for Surgical Clearance    Procedure:  BILATERAL UPPER EYELID BLEPHAROPLASTY, BILATERAL UPPER EYELID BLEPHAROPLASTY REPAIR  Date of Surgery:  Clearance 08/25/23                                Surgeon:  DR. Peggie Bowen Surgeon's Group or Practice Name:  [facility] Phone number:  [phone] Fax number:  [phone]   Type of Clearance Requested:   - Medical  - Pharmacy:  Hold Apixaban  (Eliquis ) NOT INDICATED   Type of Anesthesia:  Not Indicated   Additional requests/questions:    Berenda Breaker   07/02/2023, 11:08 AM

## 2023-07-03 NOTE — Telephone Encounter (Signed)
 Please advise holding Eliquis  prior to bilateral blepharoplasty on 7/1  Thank you!  DW

## 2023-07-08 ENCOUNTER — Telehealth: Payer: Self-pay | Admitting: *Deleted

## 2023-07-08 DIAGNOSIS — I272 Pulmonary hypertension, unspecified: Secondary | ICD-10-CM | POA: Insufficient documentation

## 2023-07-08 DIAGNOSIS — Z7901 Long term (current) use of anticoagulants: Secondary | ICD-10-CM | POA: Insufficient documentation

## 2023-07-08 DIAGNOSIS — I6522 Occlusion and stenosis of left carotid artery: Secondary | ICD-10-CM | POA: Insufficient documentation

## 2023-07-08 DIAGNOSIS — J9691 Respiratory failure, unspecified with hypoxia: Secondary | ICD-10-CM | POA: Insufficient documentation

## 2023-07-08 DIAGNOSIS — I5033 Acute on chronic diastolic (congestive) heart failure: Secondary | ICD-10-CM | POA: Insufficient documentation

## 2023-07-08 NOTE — Telephone Encounter (Signed)
 Left message to call back and schedule tele preop APP in June as procedure is not until 08/25/23.

## 2023-07-08 NOTE — Telephone Encounter (Signed)
 Pt called back and has been scheduled tele preop appt 08/03/23. Med rec and consent are done.

## 2023-07-08 NOTE — Telephone Encounter (Signed)
   Name: Kristi Montgomery  DOB: 05-21-1930  MRN: [id number]  Primary Cardiologist: None   Preoperative team, please contact this patient and set up a phone call appointment for further preoperative risk assessment. Please obtain consent and complete medication review. Thank you for your help.  I confirm that guidance regarding antiplatelet and oral anticoagulation therapy has been completed and, if necessary, noted below.  Per office protocol, patient can hold Eliquis  for 1 day prior to procedure.  Please resume Eliquis  as soon as possible postprocedure, at the discretion of the surgeon.    I also confirmed the patient resides in the state of [place] . As per [facility] Medical Board telemedicine laws, the patient must reside in the state in which the provider is licensed.   Jude Norton, NP 07/08/2023, 9:21 AM [facility]

## 2023-07-08 NOTE — Telephone Encounter (Signed)
 Pt called back and has been scheduled tele preop appt 08/03/23. Med rec and consent are done.       Patient Consent for Virtual Visit        Kristi Montgomery has provided verbal consent on 07/08/2023 for a virtual visit (video or telephone).   CONSENT FOR VIRTUAL VISIT FOR:  Kristi Montgomery  By participating in this virtual visit I agree to the following:  I hereby voluntarily request, consent and authorize [facility] and its employed or contracted physicians, physician assistants, nurse practitioners or other licensed health care professionals (the Practitioner), to provide me with telemedicine health care services (the "Services") as deemed necessary by the treating Practitioner. I acknowledge and consent to receive the Services by the Practitioner via telemedicine. I understand that the telemedicine visit will involve communicating with the Practitioner through live audiovisual communication technology and the disclosure of certain medical information by electronic transmission. I acknowledge that I have been given the opportunity to request an in-person assessment or other available alternative prior to the telemedicine visit and am voluntarily participating in the telemedicine visit.  I understand that I have the right to withhold or withdraw my consent to the use of telemedicine in the course of my care at any time, without affecting my right to future care or treatment, and that the Practitioner or I may terminate the telemedicine visit at any time. I understand that I have the right to inspect all information obtained and/or recorded in the course of the telemedicine visit and may receive copies of available information for a reasonable fee.  I understand that some of the potential risks of receiving the Services via telemedicine include:  Delay or interruption in medical evaluation due to technological equipment failure or disruption; Information transmitted may not be  sufficient (e.g. poor resolution of images) to allow for appropriate medical decision making by the Practitioner; and/or  In rare instances, security protocols could fail, causing a breach of personal health information.  Furthermore, I acknowledge that it is my responsibility to provide information about my medical history, conditions and care that is complete and accurate to the best of my ability. I acknowledge that Practitioner's advice, recommendations, and/or decision may be based on factors not within their control, such as incomplete or inaccurate data provided by me or distortions of diagnostic images or specimens that may result from electronic transmissions. I understand that the practice of medicine is not an exact science and that Practitioner makes no warranties or guarantees regarding treatment outcomes. I acknowledge that a copy of this consent can be made available to me via my patient portal [facility] MyChart), or I can request a printed copy by calling the office of [facility].    I understand that my insurance will be billed for this visit.   I have read or had this consent read to me. I understand the contents of this consent, which adequately explains the benefits and risks of the Services being provided via telemedicine.  I have been provided ample opportunity to ask questions regarding this consent and the Services and have had my questions answered to my satisfaction. I give my informed consent for the services to be provided through the use of telemedicine in my medical care

## 2023-07-08 NOTE — Telephone Encounter (Signed)
 Pt would the surgery scheduler [place] with Dr. Zaldivar office to call her and confirm the surgery date as she tells me that [place] told her that the surgery would be on a Monday and 08/25/23 is a Tuesday. She said she has been trying to reach [place] but not able to get through.

## 2023-07-08 NOTE — Telephone Encounter (Signed)
 Patient with diagnosis of A Fib on Eliquis  for anticoagulation.    Procedure: BILATERAL UPPER EYELID BLEPHAROPLASTY, BILATERAL UPPER EYELID BLEPHAROPLASTY REPAIR  Date of procedure: 08/25/23   CHA2DS2-VASc Score = 6  This indicates a 9.7% annual risk of stroke. The patient's score is based upon: CHF History: 1 HTN History: 1 Diabetes History: 0 Stroke History: 0 Vascular Disease History: 1 Age Score: 2 Gender Score: 1   CrCl 43 ml/min Platelet count 262K   Per office protocol, patient can hold Eliquis  for 1 day prior to procedure.     **This guidance is not considered finalized until pre-operative APP has relayed final recommendations.**

## 2023-07-21 DIAGNOSIS — R350 Frequency of micturition: Secondary | ICD-10-CM | POA: Diagnosis not present

## 2023-07-21 DIAGNOSIS — Z79899 Other long term (current) drug therapy: Secondary | ICD-10-CM | POA: Diagnosis not present

## 2023-07-21 DIAGNOSIS — I130 Hypertensive heart and chronic kidney disease with heart failure and stage 1 through stage 4 chronic kidney disease, or unspecified chronic kidney disease: Secondary | ICD-10-CM | POA: Diagnosis not present

## 2023-07-21 DIAGNOSIS — N182 Chronic kidney disease, stage 2 (mild): Secondary | ICD-10-CM | POA: Diagnosis not present

## 2023-07-21 DIAGNOSIS — I480 Paroxysmal atrial fibrillation: Secondary | ICD-10-CM | POA: Diagnosis not present

## 2023-07-21 DIAGNOSIS — I272 Pulmonary hypertension, unspecified: Secondary | ICD-10-CM | POA: Diagnosis not present

## 2023-07-21 DIAGNOSIS — R531 Weakness: Secondary | ICD-10-CM | POA: Diagnosis not present

## 2023-07-21 DIAGNOSIS — I5032 Chronic diastolic (congestive) heart failure: Secondary | ICD-10-CM | POA: Diagnosis not present

## 2023-07-21 DIAGNOSIS — F03A4 Unspecified dementia, mild, with anxiety: Secondary | ICD-10-CM | POA: Diagnosis not present

## 2023-07-21 DIAGNOSIS — R42 Dizziness and giddiness: Secondary | ICD-10-CM | POA: Diagnosis not present

## 2023-07-21 DIAGNOSIS — F419 Anxiety disorder, unspecified: Secondary | ICD-10-CM | POA: Diagnosis not present

## 2023-07-21 DIAGNOSIS — F329 Major depressive disorder, single episode, unspecified: Secondary | ICD-10-CM | POA: Diagnosis not present

## 2023-07-22 DIAGNOSIS — M9903 Segmental and somatic dysfunction of lumbar region: Secondary | ICD-10-CM | POA: Diagnosis not present

## 2023-07-22 DIAGNOSIS — M51361 Other intervertebral disc degeneration, lumbar region with lower extremity pain only: Secondary | ICD-10-CM | POA: Diagnosis not present

## 2023-07-22 DIAGNOSIS — M9905 Segmental and somatic dysfunction of pelvic region: Secondary | ICD-10-CM | POA: Diagnosis not present

## 2023-07-22 DIAGNOSIS — M9904 Segmental and somatic dysfunction of sacral region: Secondary | ICD-10-CM | POA: Diagnosis not present

## 2023-07-27 DIAGNOSIS — M9903 Segmental and somatic dysfunction of lumbar region: Secondary | ICD-10-CM | POA: Diagnosis not present

## 2023-07-27 DIAGNOSIS — M51361 Other intervertebral disc degeneration, lumbar region with lower extremity pain only: Secondary | ICD-10-CM | POA: Diagnosis not present

## 2023-07-27 DIAGNOSIS — M9904 Segmental and somatic dysfunction of sacral region: Secondary | ICD-10-CM | POA: Diagnosis not present

## 2023-07-27 DIAGNOSIS — M9905 Segmental and somatic dysfunction of pelvic region: Secondary | ICD-10-CM | POA: Diagnosis not present

## 2023-07-28 DIAGNOSIS — M51361 Other intervertebral disc degeneration, lumbar region with lower extremity pain only: Secondary | ICD-10-CM | POA: Diagnosis not present

## 2023-07-28 DIAGNOSIS — M9904 Segmental and somatic dysfunction of sacral region: Secondary | ICD-10-CM | POA: Diagnosis not present

## 2023-07-28 DIAGNOSIS — M9905 Segmental and somatic dysfunction of pelvic region: Secondary | ICD-10-CM | POA: Diagnosis not present

## 2023-07-28 DIAGNOSIS — M9903 Segmental and somatic dysfunction of lumbar region: Secondary | ICD-10-CM | POA: Diagnosis not present

## 2023-07-29 ENCOUNTER — Other Ambulatory Visit: Payer: Self-pay

## 2023-07-29 ENCOUNTER — Emergency Department (HOSPITAL_COMMUNITY)

## 2023-07-29 ENCOUNTER — Observation Stay (HOSPITAL_COMMUNITY)
Admission: EM | Admit: 2023-07-29 | Discharge: 2023-07-31 | Disposition: A | Discharge status: Home Health | Attending: Internal Medicine | Admitting: Internal Medicine

## 2023-07-29 ENCOUNTER — Encounter (HOSPITAL_COMMUNITY): Payer: Self-pay | Admitting: Emergency Medicine

## 2023-07-29 DIAGNOSIS — Z85820 Personal history of malignant melanoma of skin: Secondary | ICD-10-CM | POA: Diagnosis not present

## 2023-07-29 DIAGNOSIS — R1031 Right lower quadrant pain: Secondary | ICD-10-CM | POA: Diagnosis not present

## 2023-07-29 DIAGNOSIS — F0390 Unspecified dementia without behavioral disturbance: Secondary | ICD-10-CM | POA: Insufficient documentation

## 2023-07-29 DIAGNOSIS — E871 Hypo-osmolality and hyponatremia: Secondary | ICD-10-CM | POA: Diagnosis not present

## 2023-07-29 DIAGNOSIS — N182 Chronic kidney disease, stage 2 (mild): Secondary | ICD-10-CM | POA: Insufficient documentation

## 2023-07-29 DIAGNOSIS — Z7901 Long term (current) use of anticoagulants: Secondary | ICD-10-CM | POA: Diagnosis not present

## 2023-07-29 DIAGNOSIS — R197 Diarrhea, unspecified: Secondary | ICD-10-CM | POA: Diagnosis not present

## 2023-07-29 DIAGNOSIS — I480 Paroxysmal atrial fibrillation: Secondary | ICD-10-CM | POA: Insufficient documentation

## 2023-07-29 DIAGNOSIS — Z9049 Acquired absence of other specified parts of digestive tract: Secondary | ICD-10-CM | POA: Diagnosis not present

## 2023-07-29 DIAGNOSIS — I130 Hypertensive heart and chronic kidney disease with heart failure and stage 1 through stage 4 chronic kidney disease, or unspecified chronic kidney disease: Secondary | ICD-10-CM | POA: Insufficient documentation

## 2023-07-29 DIAGNOSIS — Z91040 Latex allergy status: Secondary | ICD-10-CM | POA: Diagnosis not present

## 2023-07-29 DIAGNOSIS — Z87891 Personal history of nicotine dependence: Secondary | ICD-10-CM | POA: Insufficient documentation

## 2023-07-29 DIAGNOSIS — R1084 Generalized abdominal pain: Secondary | ICD-10-CM | POA: Diagnosis not present

## 2023-07-29 DIAGNOSIS — K5730 Diverticulosis of large intestine without perforation or abscess without bleeding: Secondary | ICD-10-CM | POA: Diagnosis not present

## 2023-07-29 DIAGNOSIS — Z79899 Other long term (current) drug therapy: Secondary | ICD-10-CM | POA: Insufficient documentation

## 2023-07-29 DIAGNOSIS — R2689 Other abnormalities of gait and mobility: Secondary | ICD-10-CM | POA: Diagnosis not present

## 2023-07-29 DIAGNOSIS — F03A0 Unspecified dementia, mild, without behavioral disturbance, psychotic disturbance, mood disturbance, and anxiety: Secondary | ICD-10-CM | POA: Diagnosis present

## 2023-07-29 DIAGNOSIS — I5032 Chronic diastolic (congestive) heart failure: Secondary | ICD-10-CM | POA: Diagnosis not present

## 2023-07-29 DIAGNOSIS — I10 Essential (primary) hypertension: Secondary | ICD-10-CM | POA: Diagnosis present

## 2023-07-29 LAB — CBC WITH DIFFERENTIAL/PLATELET
Abs Immature Granulocytes: 0.02 10*3/uL (ref 0.00–0.07)
Basophils Absolute: 0 10*3/uL (ref 0.0–0.1)
Basophils Relative: 1 %
Eosinophils Absolute: 0 10*3/uL (ref 0.0–0.5)
Eosinophils Relative: 0 %
HCT: 30.3 % — ABNORMAL LOW (ref 36.0–46.0)
Hemoglobin: 11 g/dL — ABNORMAL LOW (ref 12.0–15.0)
Immature Granulocytes: 0 %
Lymphocytes Relative: 17 %
Lymphs Abs: 0.9 10*3/uL (ref 0.7–4.0)
MCH: 36.7 pg — ABNORMAL HIGH (ref 26.0–34.0)
MCHC: 36.3 g/dL — ABNORMAL HIGH (ref 30.0–36.0)
MCV: 101 fL — ABNORMAL HIGH (ref 80.0–100.0)
Monocytes Absolute: 0.6 10*3/uL (ref 0.1–1.0)
Monocytes Relative: 11 %
Neutro Abs: 4 10*3/uL (ref 1.7–7.7)
Neutrophils Relative %: 71 %
Platelets: 248 10*3/uL (ref 150–400)
RBC: 3 MIL/uL — ABNORMAL LOW (ref 3.87–5.11)
RDW: 15.9 % — ABNORMAL HIGH (ref 11.5–15.5)
WBC: 5.7 10*3/uL (ref 4.0–10.5)
nRBC: 0.4 % — ABNORMAL HIGH (ref 0.0–0.2)

## 2023-07-29 LAB — COMPREHENSIVE METABOLIC PANEL WITH GFR
ALT: 16 U/L (ref 0–44)
AST: 24 U/L (ref 15–41)
Albumin: 4.1 g/dL (ref 3.5–5.0)
Alkaline Phosphatase: 56 U/L (ref 38–126)
Anion gap: 9 (ref 5–15)
BUN: 8 mg/dL (ref 8–23)
CO2: 23 mmol/L (ref 22–32)
Calcium: 9 mg/dL (ref 8.9–10.3)
Chloride: 91 mmol/L — ABNORMAL LOW (ref 98–111)
Creatinine, Ser: 0.75 mg/dL (ref 0.44–1.00)
GFR, Estimated: >60 mL/min (ref >60)
Glucose, Bld: 144 mg/dL — ABNORMAL HIGH (ref 70–99)
Potassium: 3.8 mmol/L (ref 3.5–5.1)
Sodium: 123 mmol/L — ABNORMAL LOW (ref 135–145)
Total Bilirubin: 1.3 mg/dL — ABNORMAL HIGH (ref 0.0–1.2)
Total Protein: 7.1 g/dL (ref 6.5–8.1)

## 2023-07-29 LAB — C DIFFICILE QUICK SCREEN W PCR REFLEX
C Diff antigen: NEGATIVE
C Diff interpretation: NOT DETECTED
C Diff toxin: NEGATIVE

## 2023-07-29 LAB — URINALYSIS, ROUTINE W REFLEX MICROSCOPIC
Bilirubin Urine: NEGATIVE
Glucose, UA: NEGATIVE mg/dL
Ketones, ur: NEGATIVE mg/dL
Leukocytes,Ua: NEGATIVE
Nitrite: NEGATIVE
Protein, ur: NEGATIVE mg/dL
Specific Gravity, Urine: <1.005 — ABNORMAL LOW (ref 1.005–1.030)
pH: 7 (ref 5.0–8.0)

## 2023-07-29 LAB — MAGNESIUM: Magnesium: 1.7 mg/dL (ref 1.7–2.4)

## 2023-07-29 LAB — OSMOLALITY, URINE: Osmolality, Ur: 202 mosm/kg — ABNORMAL LOW (ref 300–900)

## 2023-07-29 LAB — URINALYSIS, MICROSCOPIC (REFLEX)

## 2023-07-29 LAB — LIPASE, BLOOD: Lipase: 36 U/L (ref 11–51)

## 2023-07-29 LAB — SODIUM, URINE, RANDOM: Sodium, Ur: 43 mmol/L

## 2023-07-29 MED ORDER — ACETAMINOPHEN 650 MG RE SUPP: 650.0000 mg | Freq: Four times a day (QID) | RECTAL | Status: DC | PRN

## 2023-07-29 MED ORDER — AMLODIPINE BESYLATE 10 MG PO TABS
10.0000 mg | ORAL_TABLET | Freq: Every morning | ORAL | Status: DC
  Administered 2023-07-30: 10 mg via ORAL
  Filled 2023-07-29: qty 1

## 2023-07-29 MED ORDER — MAGNESIUM OXIDE -MG SUPPLEMENT 400 (240 MG) MG PO TABS
400.0000 mg | ORAL_TABLET | Freq: Once | ORAL | Status: AC
  Administered 2023-07-29: 400 mg via ORAL
  Filled 2023-07-29: qty 1

## 2023-07-29 MED ORDER — ONDANSETRON HCL 4 MG/2ML IJ SOLN: 4.0000 mg | Freq: Four times a day (QID) | INTRAMUSCULAR | Status: DC | PRN

## 2023-07-29 MED ORDER — ACETAMINOPHEN 325 MG PO TABS: 650.0000 mg | ORAL_TABLET | Freq: Four times a day (QID) | ORAL | Status: DC | PRN

## 2023-07-29 MED ORDER — DONEPEZIL HCL 10 MG PO TABS
5.0000 mg | ORAL_TABLET | Freq: Two times a day (BID) | ORAL | Status: DC
  Administered 2023-07-30 – 2023-07-31 (×3): 5 mg via ORAL
  Filled 2023-07-29 (×3): qty 1

## 2023-07-29 MED ORDER — KETOROLAC TROMETHAMINE 15 MG/ML IJ SOLN
15.0000 mg | Freq: Once | INTRAMUSCULAR | Status: AC
  Administered 2023-07-29: 15 mg via INTRAVENOUS
  Filled 2023-07-29: qty 1

## 2023-07-29 MED ORDER — SODIUM CHLORIDE 0.9% FLUSH
3.0000 mL | Freq: Two times a day (BID) | INTRAVENOUS | Status: DC
  Administered 2023-07-29 – 2023-07-31 (×3): 3 mL via INTRAVENOUS

## 2023-07-29 MED ORDER — ZOLPIDEM TARTRATE 5 MG PO TABS
5.0000 mg | ORAL_TABLET | Freq: Every day | ORAL | Status: DC
  Administered 2023-07-29 – 2023-07-30 (×2): 5 mg via ORAL
  Filled 2023-07-29 (×2): qty 1

## 2023-07-29 MED ORDER — POTASSIUM CHLORIDE IN NACL 20-0.9 MEQ/L-% IV SOLN
INTRAVENOUS | Status: DC
  Filled 2023-07-29: qty 1000

## 2023-07-29 MED ORDER — IOHEXOL 350 MG/ML SOLN
75.0000 mL | Freq: Once | INTRAVENOUS | Status: AC | PRN
  Administered 2023-07-29: 75 mL via INTRAVENOUS

## 2023-07-29 MED ORDER — SODIUM CHLORIDE 0.9 % IV BOLUS
1000.0000 mL | Freq: Once | INTRAVENOUS | Status: AC
  Administered 2023-07-29: 1000 mL via INTRAVENOUS

## 2023-07-29 MED ORDER — ONDANSETRON HCL 4 MG PO TABS: 4.0000 mg | ORAL_TABLET | Freq: Four times a day (QID) | ORAL | Status: DC | PRN

## 2023-07-29 MED ORDER — METOPROLOL SUCCINATE ER 25 MG PO TB24
25.0000 mg | ORAL_TABLET | Freq: Every day | ORAL | Status: DC
  Administered 2023-07-30: 25 mg via ORAL
  Filled 2023-07-29: qty 1

## 2023-07-29 MED ORDER — APIXABAN 2.5 MG PO TABS
2.5000 mg | ORAL_TABLET | Freq: Two times a day (BID) | ORAL | Status: DC
  Administered 2023-07-29 – 2023-07-31 (×4): 2.5 mg via ORAL
  Filled 2023-07-29 (×4): qty 1

## 2023-07-29 MED ORDER — ACETAMINOPHEN 325 MG PO TABS
650.0000 mg | ORAL_TABLET | Freq: Once | ORAL | Status: AC
  Administered 2023-07-29: 650 mg via ORAL
  Filled 2023-07-29: qty 2

## 2023-07-29 NOTE — ED Provider Triage Note (Signed)
 Emergency Medicine Provider Triage Evaluation Note  Kristi Montgomery , a 89 y.o. female  was evaluated in triage.  Pt complains of right lower abdominal pain.  She has noted this for a few weeks but it seems to be getting worse, she is having loose stools.  Her PCP told her to come into the ED.  The pain is intermittent but worse at night and she feels a bubble in the right lower side of the abdomen.  She also has chronic sciatica and is suffering from worsening sciatica pain down her left leg.  She is on Eliquis  for A-fib.  Reports her only abdominal surgical history is cholecystectomy.  Review of Systems  Positive: Lower abdominal pain, sciatica Negative: Vomiting, bloody stool  Physical Exam  BP (!) 152/56   Pulse 67   Temp 98.3 F (36.8 C) (Oral)   Resp 18   Wt 54.4 kg   SpO2 100%   BMI 21.26 kg/m  Gen:   Awake, no distress   Resp:  Normal effort  MSK:   Moves extremities without difficulty  Other:  No significant abdominal discomfort or palpable hernia on exam  Medical Decision Making  Medically screening exam initiated at 12:21 PM.  Appropriate orders placed.  Kristi Montgomery was informed that the remainder of the evaluation will be completed by another provider, this initial triage assessment does not replace that evaluation, and the importance of remaining in the ED until their evaluation is complete.  Lower abdominal pain, loose stools, labs and likely imaging to be ordered.  Single dose Toradol be given here once for pain as she reports she has trouble sitting in a chair in the ED for long period of time due to back pain.  She does not want narcotics.   Arvilla Birmingham, MD 07/29/23 [phone]

## 2023-07-29 NOTE — ED Triage Notes (Signed)
 Patient c/o RLQ abdominal pain, nausea and diarrhea x 2 weeks.  Patient gives verbal consent for MSE.

## 2023-07-29 NOTE — ED Provider Notes (Signed)
 [place] EMERGENCY DEPARTMENT AT [facility] Provider Note   CSN: [id number] Arrival date & time: 07/29/23  1121     History  Chief Complaint  Patient presents with   Abdominal Pain    Kristi Montgomery is a 89 y.o. female.   Abdominal Pain Associated symptoms: diarrhea and nausea      89 year old female with medical history significant for temporal arteritis, atrial fibrillation on Eliquis , CKD stage II, GERD, HTN, HLD, hyponatremia, CHF prothesis last EF 60 to 65% with diastolic dysfunction), pulmonary hypertension presenting to the emergency department with diarrhea and abdominal pain and cramping.  The patient symptoms started after she completed a course of antibiotics for urinary tract infection.  The patient and her daughter cannot remember the name of the antibiotic that she took.  She has had roughly 2 weeks of watery diarrhea.  Her last bowel movement was yesterday.  She has had the development intervally of abdominal pain located in the right side of her abdomen with associated nausea, no vomiting.  She denies any fevers or chills.  She denies any persistent urinary symptoms.  No blood in her urine.  She has had decreased oral intake.  Her daughter has noted some increasing confusion over the past several months.  Past surgical history is prior cholecystectomy.  Home Medications Prior to Admission medications   Medication Sig Start Date End Date Taking? Authorizing Provider  amLODipine  (NORVASC ) 10 MG tablet Take 1 tablet (10 mg total) by mouth daily. Patient taking differently: Take 10 mg by mouth at bedtime. 12/18/20 10/31/22  Cantwell, Celeste C, PA-C  apixaban  (ELIQUIS ) 2.5 MG TABS tablet Take 1 tablet (2.5 mg total) by mouth 2 (two) times daily. 10/25/20   Deforest Fast, MD  cyclobenzaprine (FLEXERIL) 5 MG tablet Take 5 mg by mouth at bedtime. 03/26/22   [provider]  donepezil (ARICEPT) 5 MG tablet Take 1 tablet by mouth 2 (two) times daily.     [provider]  metoprolol  succinate (TOPROL -XL) 25 MG 24 hr tablet Take 1 tablet by mouth every evening. Take with or immediately following a meal. 11/19/22   Knox Perl, MD  omeprazole (PRILOSEC) 40 MG capsule Take 40 mg by mouth daily as needed (heartburn).    [provider]  Polyethyl Glycol-Propyl Glycol (SYSTANE OP) Place 1 drop into both eyes daily as needed (dry eyes).    [provider]  spironolactone  (ALDACTONE ) 25 MG tablet TAKE 1/2 TABLET BY MOUTH DAILY 03/23/23   Tolia, Sunit, DO  traMADol (ULTRAM) 50 MG tablet Take 50 mg by mouth every 6 (six) hours as needed. 03/26/22   [provider]  traZODone (DESYREL) 100 MG tablet Take 100 mg by mouth at bedtime. 06/09/22   [provider]  zolpidem (AMBIEN) 10 MG tablet Take 5 mg by mouth at bedtime. 09/18/20   [provider]      Allergies    Amoxicillin, Amoxicillin-pot clavulanate, Codeine, Latex, and Tape    Review of Systems   Review of Systems  Gastrointestinal:  Positive for abdominal pain, diarrhea and nausea.  All other systems reviewed and are negative.   Physical Exam Updated Vital Signs BP (!) 152/56   Pulse 67   Temp 98.3 F (36.8 C) (Oral)   Resp 18   Wt 54.4 kg   SpO2 100%   BMI 21.26 kg/m  Physical Exam Vitals and nursing note reviewed.  Constitutional:      General: She is not in acute distress.  Appearance: She is well-developed.  HENT:     Head: Normocephalic and atraumatic.  Eyes:     Conjunctiva/sclera: Conjunctivae normal.  Cardiovascular:     Rate and Rhythm: Normal rate and regular rhythm.     Heart sounds: No murmur heard. Pulmonary:     Effort: Pulmonary effort is normal. No respiratory distress.     Breath sounds: Normal breath sounds.  Abdominal:     Palpations: Abdomen is soft.     Tenderness: There is no abdominal tenderness. There is no guarding or rebound.  Musculoskeletal:        General: No swelling.     Cervical back: Neck  supple.  Skin:    General: Skin is warm and dry.     Capillary Refill: Capillary refill takes less than 2 seconds.  Neurological:     Mental Status: She is alert.     GCS: GCS eye subscore is 4. GCS verbal subscore is 5. GCS motor subscore is 6.     Cranial Nerves: Cranial nerves 2-12 are intact.     Sensory: Sensation is intact.     Motor: Motor function is intact.  Psychiatric:        Mood and Affect: Mood normal.     ED Results / Procedures / Treatments   Labs (all labs ordered are listed, but only abnormal results are displayed) Labs Reviewed  COMPREHENSIVE METABOLIC PANEL WITH GFR - Abnormal; Notable for the following components:      Result Value   Sodium 123 (*)    Chloride 91 (*)    Glucose, Bld 144 (*)    Total Bilirubin 1.3 (*)    All other components within normal limits  CBC WITH DIFFERENTIAL/PLATELET - Abnormal; Notable for the following components:   RBC 3.00 (*)    Hemoglobin 11.0 (*)    HCT 30.3 (*)    MCV 101.0 (*)    MCH 36.7 (*)    MCHC 36.3 (*)    RDW 15.9 (*)    nRBC 0.4 (*)    All other components within normal limits  C DIFFICILE QUICK SCREEN W PCR REFLEX    LIPASE, BLOOD  URINALYSIS, ROUTINE W REFLEX MICROSCOPIC  OSMOLALITY  OSMOLALITY, URINE  SODIUM, URINE, RANDOM  MAGNESIUM    EKG None  Radiology CT ABDOMEN PELVIS W CONTRAST Result Date: 07/29/2023 CLINICAL DATA:  RLQ abdominal pain. EXAM: CT ABDOMEN AND PELVIS WITH CONTRAST TECHNIQUE: Multidetector CT imaging of the abdomen and pelvis was performed using the standard protocol following bolus administration of intravenous contrast. RADIATION DOSE REDUCTION: This exam was performed according to the departmental dose-optimization program which includes automated exposure control, adjustment of the mA and/or kV according to patient size and/or use of iterative reconstruction technique. CONTRAST:  75mL OMNIPAQUE IOHEXOL 350 MG/ML SOLN COMPARISON:  CT scan abdomen and pelvis from 01/18/2018.  FINDINGS: Lower chest: The lung bases are clear. No pleural effusion. The heart is normal in size. No pericardial effusion. Hepatobiliary: The liver is normal in size. Non-cirrhotic configuration. No suspicious mass. No intrahepatic bile duct dilation. There is mild prominence of the extrahepatic bile duct, most likely due to post cholecystectomy status. Gallbladder is surgically absent. Pancreas: Unremarkable. No pancreatic ductal dilatation or surrounding inflammatory changes. Spleen: Normal in size. Redemonstration of ill-defined vague hypoattenuating area in the central spleen, which is of indeterminate etiology but present since the prior study. Adrenals/Urinary Tract: Adrenal glands are unremarkable. No suspicious renal mass. There is a predominantly exophytic 1.4 x 1.8  cm cyst arising from the left kidney lower pole, laterally. There multiple additional subcentimeter sized hypoattenuating foci throughout bilateral kidneys, which are too small to adequately characterize. No nephroureterolithiasis or obstructive uropathy on either side. Unremarkable urinary bladder. Stomach/Bowel: No disproportionate dilation of the small or large bowel loops. No evidence of abnormal bowel wall thickening or inflammatory changes. The appendix is unremarkable. There are multiple diverticula throughout the colon, without imaging signs of diverticulitis. Vascular/Lymphatic: No ascites or pneumoperitoneum. No abdominal or pelvic lymphadenopathy, by size criteria. No aneurysmal dilation of the major abdominal arteries. There are marked peripheral atherosclerotic vascular calcifications of the aorta and its major branches. Reproductive: The uterus is unremarkable. No large adnexal mass. Other: The visualized soft tissues and abdominal wall are unremarkable. Musculoskeletal: No suspicious osseous lesions. There are mild - moderate multilevel degenerative changes in the visualized spine. IMPRESSION: 1. No acute inflammatory process  identified within the abdomen or pelvis. Unremarkable appendix. 2. Multiple other nonacute observations, as described above. Aortic Atherosclerosis (ICD10-I70.0). Electronically Signed   By: Beula Brunswick M.D.   On: 07/29/2023 14:24    Procedures Procedures    Medications Ordered in ED Medications  ketorolac (TORADOL) 15 MG/ML injection 15 mg (15 mg Intravenous Given 07/29/23 1234)  acetaminophen  (TYLENOL ) tablet 650 mg (650 mg Oral Given 07/29/23 1234)  iohexol (OMNIPAQUE) 350 MG/ML injection 75 mL (75 mLs Intravenous Contrast Given 07/29/23 1413)  sodium chloride  0.9 % bolus 1,000 mL (1,000 mLs Intravenous New Bag/Given 07/29/23 1550)    ED Course/ Medical Decision Making/ A&P Clinical Course as of 07/29/23 1553  Wed Jul 29, 2023  1535 Sodium(!): 123 [JL]    Clinical Course User Index [JL] Rosealee Concha, MD                                 Medical Decision Making Amount and/or Complexity of Data Reviewed Labs: ordered. Decision-making details documented in ED Course.  Risk Decision regarding hospitalization.    89 year old female with medical history significant for temporal arteritis, atrial fibrillation on Eliquis , CKD stage II, GERD, HTN, HLD, hyponatremia, CHF prothesis last EF 60 to 65% with diastolic dysfunction), pulmonary hypertension presenting to the emergency department with diarrhea and abdominal pain and cramping.  The patient symptoms started after she completed a course of antibiotics for urinary tract infection.  The patient and her daughter cannot remember the name of the antibiotic that she took.  She has had roughly 2 weeks of watery diarrhea.  Her last bowel movement was yesterday.  She has had the development intervally of abdominal pain located in the right side of her abdomen with associated nausea, no vomiting.  She denies any fevers or chills.  She denies any persistent urinary symptoms.  No blood in her urine.  She has had decreased oral intake.  Her daughter has  noted some increasing confusion over the past several months.  Past surgical history is prior cholecystectomy.  On arrival, the patient was afebrile, not tachycardic or tachypneic, BP 152/56, saturating her percent on room air.  Sinus rhythm noted on cardiac telemetry.  Patient exam significant for an abdomen that was soft, nontender, nondistended, no rebound or guarding, neurologic exam intact, patient GCS 15, ABC intact.  Patient initial laboratory evaluation performed in triage and initial CT imaging performed in triage complete upon my evaluation.  CMP reveals acute on chronic hyponatremia with a sodium of 123, otherwise unremarkable, CBC with no leukocytosis, hemoglobin  11, lipase normal, urinalysis ordered and pending.  Added on urine awesome's, serum awesome's, urine sodium, magnesium as well as C. difficile screen due to concern for potential C. difficile infection causing abdominal cramping and discomfort.  The abdomen pelvis was performed revealed no acute abnormality.  Status post Tylenol  and Toradol the patient was feeling symptomatically improved.  She was administered a 1 L NaCl bolus for volume resuscitation in the setting of her diarrhea and hyponatremia.  Given the patient's sodium of 123 for (most recent measurement was 134 1 year ago), recommended admission for observation and further management, Dr. Lydia Sams accepting.   Final Clinical Impression(s) / ED Diagnoses Final diagnoses:  Generalized abdominal pain  Hyponatremia  Diarrhea, unspecified type    Rx / DC Orders ED Discharge Orders     None         Rosealee Concha, MD 07/29/23 [phone]

## 2023-07-29 NOTE — Hospital Course (Signed)
 Kristi Montgomery is a 89 y.o. female with medical history significant for PAF on Eliquis , chronic HFpEF (60-65%, G2DD), HTN, mild dementia who was admitted with hyponatremia.

## 2023-07-29 NOTE — H&P (Signed)
 History and Physical    Kristi Montgomery ZOX:[id number] DOB: 02/07/1931 DOA: 07/29/2023  PCP: Margarete Sharps, MD  Patient coming from: Home  I have personally briefly reviewed patient's old medical records in [facility]  Chief Complaint: Abdominal pain, diarrhea  HPI: Kristi Montgomery is a 89 y.o. female with medical history significant for PAF on Eliquis , chronic HFpEF (60-65%, G2DD), HTN, mild dementia who presented to the ED for evaluation of abdominal pain and diarrhea.  Patient reports about 2 weeks of loose stools.  This began after she completed a course of antibiotics for UTI.  She reports developing a cramping right-sided abdominal pain over the last few days.  She has had nausea but no emesis.  She has not had any fevers, chills, diaphoresis, chest pain, dyspnea, dysuria.  She does report decreased appetite and oral intake recently.  She says that she was with her husband and daughter.  ED Course  Labs/Imaging on admission: I have personally reviewed following labs and imaging studies.  Initial vitals showed BP 152/56, pulse 67, RR 18, temp 98.3 F, SpO2 100% on room air.  Labs showed WBC 5.7, hemoglobin 11.0, platelets 248, sodium 123, potassium 3.8, bicarb 23, BUN 8, creatinine 0.75, serum glucose 144, AST 24, LT 16, alk phos 56, total bilirubin 1.3, lipase 36.  Magnesium 1.7.  Urinalysis negative for UTI.  Urine osmolality 202, urine sodium 43.  C. difficile quick screen is negative.  CT abdomen/pelvis with contrast negative for acute inflammatory process.  Patient was given 1 L normal saline, IV Toradol.  The hospitalist service was consulted to admit.  Review of Systems: All systems reviewed and are negative except as documented in history of present illness above.   Past Medical History:  Diagnosis Date   Anxiety    Cancer (HCC)    Melanoma   Cataract    Chest pain    myoview 04/01/12-normal   History of tobacco abuse    HTN (hypertension)    Memory disorder  08/06/2017    Past Surgical History:  Procedure Laterality Date   BREAST BIOPSY Left 2012   BREAST BIOPSY Right    CARDIOVERSION N/A 12/04/2020   Procedure: CARDIOVERSION;  Surgeon: Knox Perl, MD;  Location: [facility];  Service: Cardiovascular;  Laterality: N/A;   CHOLECYSTECTOMY     MELANOMA EXCISION     x5    Social History: Social History   Tobacco Use   Smoking status: Former    Current packs/day: 0.00    Types: Cigarettes    Quit date: 02/25/1988    Years since quitting: 35.4   Smokeless tobacco: Never  Vaping Use   Vaping status: Never Used  Substance Use Topics   Alcohol  use: No   Drug use: No   Allergies  Allergen Reactions   Amoxicillin Swelling and Other (See Comments)    Tongue swelling  Has patient had a PCN reaction causing immediate rash, facial/tongue/throat swelling, SOB or lightheadedness with hypotension: Yes Has patient had a PCN reaction causing severe rash involving mucus membranes or skin necrosis: Unknown Has patient had a PCN reaction that required hospitalization: Unknown Has patient had a PCN reaction occurring within the last 10 years: Unknown If all of the above answers are "NO", then may proceed with Cephalosporin use.    Amoxicillin-Pot Clavulanate Swelling and Other (See Comments)    Swollen and red tongue   Codeine Nausea And Vomiting   Latex Rash and Other (See Comments)    NO BAND-AIDS!!!!  Tape Rash and Other (See Comments)    NO BAND-AIDS OR TAPE!!!!    Family History  Problem Relation Age of Onset   Congestive Heart Failure Mother    Heart failure Mother    Heart attack Father        age 32   Breast cancer Sister    Breast cancer Sister    Heart Problems Brother    Cancer Brother    Cancer Brother        pancreatic      Prior to Admission medications   Medication Sig Start Date End Date Taking? Authorizing Provider  amLODipine  (NORVASC ) 10 MG tablet Take 1 tablet (10 mg total) by mouth daily. Patient taking  differently: Take 10 mg by mouth in the morning. 12/18/20 07/29/23 Yes Cantwell, Celeste C, PA-C  apixaban  (ELIQUIS ) 2.5 MG TABS tablet Take 1 tablet (2.5 mg total) by mouth 2 (two) times daily. 10/25/20  Yes Deforest Fast, MD  Calcium Carbonate-Simethicone (TUMS GAS RELIEF CHEWY BITES PO) Take 1 each by mouth as needed.   Yes [provider]  Cholecalciferol (VITAMIN D) 50 MCG (2000 UT) CAPS Take 1 capsule by mouth in the morning.   Yes [provider]  donepezil (ARICEPT) 5 MG tablet Take 1 tablet by mouth 2 (two) times daily.   Yes [provider]  escitalopram (LEXAPRO) 5 MG tablet Take 5 mg by mouth at bedtime.   Yes [provider]  metoprolol  succinate (TOPROL -XL) 25 MG 24 hr tablet Take 1 tablet by mouth every evening. Take with or immediately following a meal. Patient taking differently: Take 25 mg by mouth daily with supper. 11/19/22  Yes Knox Perl, MD  Multiple Vitamin (MULTIVITAMIN WITH MINERALS) TABS tablet Take 1 tablet by mouth in the morning.   Yes [provider]  Polyethyl Glycol-Propyl Glycol (SYSTANE OP) Place 1 drop into both eyes daily as needed (dry eyes).   Yes [provider]  spironolactone  (ALDACTONE ) 25 MG tablet TAKE 1/2 TABLET BY MOUTH DAILY Patient taking differently: Take 12.5 mg by mouth in the morning. 03/23/23  Yes Tolia, Sunit, DO  zolpidem (AMBIEN) 10 MG tablet Take 5 mg by mouth at bedtime. 09/18/20  Yes [provider]    Physical Exam: Vitals:   07/29/23 1204 07/29/23 1205 07/29/23 1625  BP: (!) 152/56  (!) 152/72  Pulse: 67  66  Resp: 18  18  Temp: 98.3 F (36.8 C)  97.7 F (36.5 C)  TempSrc: Oral  Oral  SpO2: 100%  97%  Weight:  54.4 kg    Constitutional: Resting bed with head elevated, NAD, calm, comfortable Eyes: EOMI, lids and conjunctivae normal ENMT: Mucous membranes are moist. Posterior pharynx clear of any exudate or lesions.Normal dentition.  Neck: normal, supple, no  masses. Respiratory: clear to auscultation bilaterally, no wheezing, no crackles. Normal respiratory effort. No accessory muscle use.  Cardiovascular: Irregular, no murmurs / rubs / gallops. No extremity edema. 2+ pedal pulses. Abdomen: Soft, mild RLQ tenderness, no masses palpated. Musculoskeletal: no clubbing / cyanosis. No joint deformity upper and lower extremities. Good ROM, no contractures. Normal muscle tone.  Skin: no rashes, lesions, ulcers. No induration Neurologic: Sensation intact. Strength 5/5 in all 4.  Psychiatric: Alert and oriented x 3. Normal mood.   EKG: Not performed.  Assessment/Plan Principal Problem:   Hyponatremia Active Problems:   Chronic heart failure with preserved ejection fraction (HFpEF, >= 50%) (HCC)   Paroxysmal atrial fibrillation (HCC)   Essential hypertension  Mild dementia (HCC)   Kristi Montgomery is a 89 y.o. female with medical history significant for PAF on Eliquis , chronic HFpEF (60-65%, G2DD), HTN, mild dementia who was admitted with hyponatremia.  Assessment and Plan: Hyponatremia: Sodium 123 on admission.  Likely due to volume dilution from GI losses, decreased oral intake, and diuretic.  Previous sodium ranged 130-134 last year. - Continue gentle IV fluid hydration overnight - Holding spironolactone  and Lexapro for now - Repeat BMP in a.m.  Chronic HFpEF: Last EF 60-65% by TTE on 523.  Appears hypovolemic on admission. - Holding spironolactone  as above - Continue Toprol -XL 25 mg daily  Paroxysmal atrial fibrillation: Rate is controlled.  Continue Toprol -XL and Eliquis .  Monitor on telemetry.  Hypertension: Continue amlodipine  and Toprol -XL.  Holding spironolactone  as above.  Mild dementia: Mood is stable and conversing well at time of admission.  Resume donepezil in AM.  Delirium and fall precautions.   DVT prophylaxis: apixaban  (ELIQUIS ) tablet 2.5 mg Start: 07/29/23 2200 apixaban  (ELIQUIS ) tablet 2.5 mg  Code Status:   Code  Status: Limited: Do not attempt resuscitation (DNR) -DNR-LIMITED -Do Not Intubate/DNI   discussed with patient on admission. Family Communication: Discussed with patient, she has discussed with family Disposition Plan: From home and likely return home pending clinical progress Consults called: None Severity of Illness: The appropriate patient status for this patient is OBSERVATION. Observation status is judged to be reasonable and necessary in order to provide the required intensity of service to ensure the patient's safety. The patient's presenting symptoms, physical exam findings, and initial radiographic and laboratory data in the context of their medical condition is felt to place them at decreased risk for further clinical deterioration. Furthermore, it is anticipated that the patient will be medically stable for discharge from the hospital within 2 midnights of admission.   Edith Gores MD [facility]  If 7PM-7AM, please contact night-coverage [email or web address]  07/29/2023, 8:05 PM

## 2023-07-30 DIAGNOSIS — E871 Hypo-osmolality and hyponatremia: Secondary | ICD-10-CM | POA: Diagnosis not present

## 2023-07-30 LAB — CBC
HCT: 26.1 % — ABNORMAL LOW (ref 36.0–46.0)
Hemoglobin: 9.3 g/dL — ABNORMAL LOW (ref 12.0–15.0)
MCH: 36.9 pg — ABNORMAL HIGH (ref 26.0–34.0)
MCHC: 35.6 g/dL (ref 30.0–36.0)
MCV: 103.6 fL — ABNORMAL HIGH (ref 80.0–100.0)
Platelets: 200 10*3/uL (ref 150–400)
RBC: 2.52 MIL/uL — ABNORMAL LOW (ref 3.87–5.11)
RDW: 15.9 % — ABNORMAL HIGH (ref 11.5–15.5)
WBC: 4.2 10*3/uL (ref 4.0–10.5)
nRBC: 0 % (ref 0.0–0.2)

## 2023-07-30 LAB — BASIC METABOLIC PANEL WITH GFR
Anion gap: 6 (ref 5–15)
BUN: 8 mg/dL (ref 8–23)
CO2: 24 mmol/L (ref 22–32)
Calcium: 8.5 mg/dL — ABNORMAL LOW (ref 8.9–10.3)
Chloride: 101 mmol/L (ref 98–111)
Creatinine, Ser: 0.62 mg/dL (ref 0.44–1.00)
GFR, Estimated: >60 mL/min (ref >60)
Glucose, Bld: 98 mg/dL (ref 70–99)
Potassium: 4 mmol/L (ref 3.5–5.1)
Sodium: 131 mmol/L — ABNORMAL LOW (ref 135–145)

## 2023-07-30 LAB — MAGNESIUM: Magnesium: 1.8 mg/dL (ref 1.7–2.4)

## 2023-07-30 LAB — OSMOLALITY: Osmolality: 275 mosm/kg (ref 275–295)

## 2023-07-30 MED ORDER — ARTIFICIAL TEARS OPHTHALMIC OINT: TOPICAL_OINTMENT | Freq: Every day | OPHTHALMIC | Status: DC | PRN

## 2023-07-30 MED ORDER — KETOROLAC TROMETHAMINE 15 MG/ML IJ SOLN
15.0000 mg | Freq: Three times a day (TID) | INTRAMUSCULAR | Status: DC | PRN
  Administered 2023-07-30: 15 mg via INTRAVENOUS
  Filled 2023-07-30: qty 1

## 2023-07-30 MED ORDER — POLYETHYL GLYCOL-PROPYL GLYCOL 0.4-0.3 % OP GEL: Freq: Every day | OPHTHALMIC | Status: DC | PRN

## 2023-07-30 MED ORDER — SACCHAROMYCES BOULARDII 250 MG PO CAPS
250.0000 mg | ORAL_CAPSULE | Freq: Two times a day (BID) | ORAL | Status: DC
  Administered 2023-07-30 – 2023-07-31 (×3): 250 mg via ORAL
  Filled 2023-07-30 (×3): qty 1

## 2023-07-30 MED ORDER — LOPERAMIDE HCL 2 MG PO CAPS: 2.0000 mg | ORAL_CAPSULE | ORAL | Status: DC | PRN

## 2023-07-30 MED ORDER — ESCITALOPRAM OXALATE 10 MG PO TABS
5.0000 mg | ORAL_TABLET | Freq: Every day | ORAL | Status: DC
  Administered 2023-07-30: 5 mg via ORAL
  Filled 2023-07-30: qty 1

## 2023-07-30 NOTE — Progress Notes (Signed)
 Kristi Montgomery  ZOX:[id number] DOB: 08/13/1930 DOA: 07/29/2023 PCP: Margarete Sharps, MD    Brief Narrative:  89 year old with a history of PAF on Eliquis , chronic diastolic CHF, HTN, and mild dementia who presented to the ER 07/29/2023 with complaints of abdominal pain and approximately 2 weeks of loose stools which began after she completed the course of antibiotics for a reported UTI.  The abdominal pain was reported as crampy in nature and focused primarily on the right side of the abdomen.  She denied fevers or chills.  She admitted to poor intake.  She continued to take her usual home medications.  In the ER she was found to have a sodium of 123 and a lipase of 36.  UA was not consistent with UTI.  C. difficile screen was negative.  CT abdomen/pelvis was negative for any acute inflammatory process.  Goals of Care:   Code Status: Limited: Do not attempt resuscitation (DNR) -DNR-LIMITED -Do Not Intubate/DNI    DVT prophylaxis: apixaban  (ELIQUIS ) tablet 2.5 mg Start: 07/29/23 2200 apixaban  (ELIQUIS ) tablet 2.5 mg   Interim Hx: No acute events recorded since the time of her admission.  She has been afebrile since admission.  Blood pressure stable as are her other vitals.  Sodium has improved to 131 overnight.  Feeling much better the time of my visit today.  Reports increased oral intake throughout the day.  Is anxious to go home.  Assessment & Plan:  Acute hyponatremia Sodium 123 at presentation -due to hypovolemia in setting of GI loss as well as ongoing use of diuretic -improving nicely with simple volume resuscitation -continue to hold diuretic  Abdominal pain and diarrhea Likely side effect of recent broad-spectrum antibiotic Zosyn -no evidence of C. difficile on testing -add probiotic and as needed Imodium   Chronic diastolic congestive heart failure EF 60-65% by TTE 2023 with grade 2 DD -hypovolemic at present -diuretic on hold for now -continue Toprol   Paroxysmal atrial  fibrillation Continue Toprol  and Eliquis  -stable on telemetry  HTN Holding diuretic but continuing amlodipine  and Toprol  with blood pressure well-controlled  Mild dementia Continue donepezil  Family Communication: Spoke with daughter at bedside at length Disposition: Anticipate discharge home   Objective: Blood pressure (!) 137/58, pulse 68, temperature 98.3 F (36.8 C), temperature source Oral, resp. rate 17, height 5\' 3"  (1.6 m), weight 54 kg, SpO2 99%.  Intake/Output Summary (Last 24 hours) at 07/30/2023 0834 Last data filed at 07/30/2023 0700 Gross per 24 hour  Intake 17.53 ml  Output --  Net 17.53 ml   Filed Weights   07/29/23 1205 07/30/23 0500  Weight: 54.4 kg 54 kg    Examination: General: No acute respiratory distress Lungs: Clear to auscultation bilaterally without wheezes or crackles Cardiovascular: Regular rate and rhythm without murmur gallop or rub normal S1 and S2 Abdomen: Nontender, nondistended, soft, bowel sounds positive, no rebound, no ascites, no appreciable mass Extremities: No significant cyanosis, clubbing, or edema bilateral lower extremities  CBC: Recent Labs  Lab 07/29/23 1223 07/30/23 0644  WBC 5.7 4.2  NEUTROABS 4.0  --   HGB 11.0* 9.3*  HCT 30.3* 26.1*  MCV 101.0* 103.6*  PLT 248 200   Basic Metabolic Panel: Recent Labs  Lab 07/29/23 1223 07/29/23 1621 07/30/23 0644  NA 123*  --  131*  K 3.8  --  4.0  CL 91*  --  101  CO2 23  --  24  GLUCOSE 144*  --  98  BUN 8  --  8  CREATININE 0.75  --  0.62  CALCIUM 9.0  --  8.5*  MG  --  1.7 1.8   GFR: Estimated Creatinine Clearance: 38.7 mL/min (by C-G formula based on SCr of 0.62 mg/dL).   Scheduled Meds:  amLODipine   10 mg Oral q AM   apixaban   2.5 mg Oral BID   donepezil  5 mg Oral BID   metoprolol  succinate  25 mg Oral Q supper   sodium chloride  flush  3 mL Intravenous Q12H   zolpidem  5 mg Oral QHS     LOS: 0 days   Abbe Abate, MD [facility] Office   [phone] Pager - Text Page per Tilford Foley  If 7PM-7AM, please contact night-coverage per Amion 07/30/2023, 8:34 AM

## 2023-07-30 NOTE — Progress Notes (Signed)
 Mobility Specialist Progress Note:    07/30/23 1506  Mobility  Activity Ambulated with assistance in hallway  Level of Assistance Contact guard assist, steadying assist  Assistive Device Front wheel walker  Distance Ambulated (ft) 400 ft  Activity Response Tolerated well  Mobility Referral Yes  Mobility visit 1 Mobility  Mobility Specialist Start Time (ACUTE ONLY) 1415  Mobility Specialist Stop Time (ACUTE ONLY) 1430  Mobility Specialist Time Calculation (min) (ACUTE ONLY) 15 min   Received pt in bed having no complaints and agreeable to mobility. Pt was asymptomatic throughout ambulation. Requested to use RW d/t slight unsteadiness. Returned to room w/o fault. Left in bed w/ call bell in reach and all needs met.   Inetta Manes Mobility Specialist  Please contact vis Secure Chat or  Rehab Office [phone]

## 2023-07-30 NOTE — Evaluation (Signed)
 Physical Therapy Evaluation Patient Details Name: Kristi Montgomery MRN: [id number] DOB: 14-Nov-1930 Today's Date: 07/30/2023  History of Present Illness  89 yo female [facility] 6[facility] with abd pain, n/d. PMH includes temporal arteritis, atrial fibrillation on Eliquis , CKD stage II, GERD, HTN, HLD, hyponatremia, CH, pulmonary HTN, cholecystectomy.  Clinical Impression   Pt presents with LE weakness, impaired balance and is high fall risk (DGI 12/24), impaired activity tolerance vs baseline. Pt to benefit from acute PT to address deficits. Pt ambulated hallway distance with HHA, occasional steadying assist needed when challenging pt balance. Pt is normally independent with intermittent use of RW, recommending HH services. PT to progress mobility as tolerated, and will continue to follow acutely.          If plan is discharge home, recommend the following: A little help with walking and/or transfers;A little help with bathing/dressing/bathroom   Can travel by private vehicle        Equipment Recommendations None recommended by PT  Recommendations for Other Services       Functional Status Assessment Patient has had a recent decline in their functional status and demonstrates the ability to make significant improvements in function in a reasonable and predictable amount of time.     Precautions / Restrictions Precautions Precautions: Fall Restrictions Weight Bearing Restrictions Per Provider Order: No      Mobility  Bed Mobility Overal bed mobility: Needs Assistance Bed Mobility: Supine to Sit, Sit to Supine     Supine to sit: Supervision Sit to supine: Supervision        Transfers Overall transfer level: Needs assistance Equipment used: 1 person hand held assist Transfers: Sit to/from Stand Sit to Stand: Supervision           General transfer comment: for safety, slow to rise and sit    Ambulation/Gait Ambulation/Gait assistance: Contact guard assist, Min  assist Gait Distance (Feet): 250 Feet Assistive device: 1 person hand held assist Gait Pattern/deviations: Step-through pattern, Decreased stride length, Trunk flexed, Narrow base of support Gait velocity: decr     General Gait Details: close guard for safety, occasional steadying assist when challenging pt balance. HHA +1 for stability  Stairs            Wheelchair Mobility     Tilt Bed    Modified Rankin (Stroke Patients Only)       Balance Overall balance assessment: Needs assistance Sitting-balance support: No upper extremity supported Sitting balance-Leahy Scale: Fair     Standing balance support: Bilateral upper extremity supported, During functional activity Standing balance-Leahy Scale: Fair                   Standardized Balance Assessment Standardized Balance Assessment : Dynamic Gait Index   Dynamic Gait Index Level Surface: Mild Impairment Change in Gait Speed: Moderate Impairment Gait with Horizontal Head Turns: Moderate Impairment Gait with Vertical Head Turns: Moderate Impairment Gait and Pivot Turn: Mild Impairment Step Over Obstacle: Mild Impairment Step Around Obstacles: Mild Impairment Steps: Moderate Impairment Total Score: 12       Pertinent Vitals/Pain Pain Assessment Pain Assessment: No/denies pain    Home Living Family/patient expects to be discharged to:: Private residence Living Arrangements: Children Available Help at Discharge: Family;Available 24 hours/day Type of Home: House Home Access: Stairs to enter Entrance Stairs-Rails: Left Entrance Stairs-Number of Steps: 4   Home Layout: One level Home Equipment: Shower seat;Cane - single point;Rolling Environmental consultant (2 wheels)      Prior  Function Prior Level of Function : Independent/Modified Independent             Mobility Comments: uses RW PRN, 1 fall in the past 6 months       Extremity/Trunk Assessment   Upper Extremity Assessment Upper Extremity Assessment:  Defer to OT evaluation    Lower Extremity Assessment Lower Extremity Assessment: Generalized weakness (3+/5 throughout and symmetric)    Cervical / Trunk Assessment Cervical / Trunk Assessment: Normal  Communication   Communication Communication: No apparent difficulties Factors Affecting Communication: Hearing impaired    Cognition Arousal: Alert Behavior During Therapy: WFL for tasks assessed/performed   PT - Cognitive impairments: No apparent impairments                         Following commands: Intact       Cueing Cueing Techniques: Verbal cues, Gestural cues     General Comments      Exercises     Assessment/Plan    PT Assessment Patient needs continued PT services  PT Problem List Decreased strength;Decreased mobility;Decreased activity tolerance;Decreased balance;Decreased knowledge of use of DME;Pain;Decreased knowledge of precautions;Decreased safety awareness       PT Treatment Interventions DME instruction;Therapeutic activities;Gait training;Patient/family education;Therapeutic exercise;Balance training;Stair training;Functional mobility training;Neuromuscular re-education    PT Goals (Current goals can be found in the Care Plan section)  Acute Rehab PT Goals Patient Stated Goal: home PT Goal Formulation: With patient/family Time For Goal Achievement: 08/13/23 Potential to Achieve Goals: Good    Frequency Min 1X/week     Co-evaluation               AM-PAC PT "6 Clicks" Mobility  Outcome Measure Help needed turning from your back to your side while in a flat bed without using bedrails?: A Little Help needed moving from lying on your back to sitting on the side of a flat bed without using bedrails?: A Little Help needed moving to and from a bed to a chair (including a wheelchair)?: A Little Help needed standing up from a chair using your arms (e.g., wheelchair or bedside chair)?: A Little Help needed to walk in hospital room?: A  Little Help needed climbing 3-5 steps with a railing? : A Little 6 Click Score: 18    End of Session   Activity Tolerance: Patient tolerated treatment well;Patient limited by fatigue Patient left: in bed;with call bell/phone within reach;with bed alarm set;with family/visitor present Nurse Communication: Mobility status PT Visit Diagnosis: Other abnormalities of gait and mobility (R26.89);Muscle weakness (generalized) (M62.81)    Time: 1610-9604 PT Time Calculation (min) (ACUTE ONLY): 17 min   Charges:   PT Evaluation $PT Eval Low Complexity: 1 Low   PT General Charges $$ ACUTE PT VISIT: 1 Visit         Shirlene Doughty, PT DPT Acute Rehabilitation Services Secure Chat Preferred  Office [phone]   Lillyanna Glandon E Burnadette Carrion 07/30/2023, 4:50 PM

## 2023-07-30 NOTE — Plan of Care (Signed)
  Problem: Pain Managment: Goal: General experience of comfort will improve and/or be controlled Outcome: Progressing   Problem: Safety: Goal: Ability to remain free from injury will improve Outcome: Progressing

## 2023-07-31 ENCOUNTER — Other Ambulatory Visit (HOSPITAL_COMMUNITY): Payer: Self-pay

## 2023-07-31 DIAGNOSIS — E871 Hypo-osmolality and hyponatremia: Secondary | ICD-10-CM | POA: Diagnosis not present

## 2023-07-31 LAB — BASIC METABOLIC PANEL WITH GFR
Anion gap: 7 (ref 5–15)
BUN: 9 mg/dL (ref 8–23)
CO2: 25 mmol/L (ref 22–32)
Calcium: 8.8 mg/dL — ABNORMAL LOW (ref 8.9–10.3)
Chloride: 96 mmol/L — ABNORMAL LOW (ref 98–111)
Creatinine, Ser: 0.74 mg/dL (ref 0.44–1.00)
GFR, Estimated: >60 mL/min (ref >60)
Glucose, Bld: 111 mg/dL — ABNORMAL HIGH (ref 70–99)
Potassium: 4 mmol/L (ref 3.5–5.1)
Sodium: 128 mmol/L — ABNORMAL LOW (ref 135–145)

## 2023-07-31 MED ORDER — SACCHAROMYCES BOULARDII 250 MG PO CAPS
250.0000 mg | ORAL_CAPSULE | Freq: Two times a day (BID) | ORAL | 0 refills | Status: AC
Start: 2023-07-31 — End: 2023-08-07
  Filled 2023-07-31: qty 14, 7d supply, fill #0

## 2023-07-31 MED ORDER — ACETAMINOPHEN 325 MG PO TABS: 650.0000 mg | ORAL_TABLET | Freq: Four times a day (QID) | ORAL | Status: AC | PRN

## 2023-07-31 MED ORDER — KETOROLAC TROMETHAMINE 10 MG PO TABS
10.0000 mg | ORAL_TABLET | Freq: Three times a day (TID) | ORAL | 0 refills | Status: AC | PRN
  Filled 2023-07-31: qty 20, 7d supply, fill #0

## 2023-07-31 MED ORDER — LOPERAMIDE HCL 2 MG PO CAPS: 2.0000 mg | ORAL_CAPSULE | ORAL | Status: AC | PRN

## 2023-07-31 NOTE — Progress Notes (Signed)
 Mobility Specialist Progress Note:    07/31/23 1211  Mobility  Activity Ambulated with assistance in hallway  Level of Assistance Contact guard assist, steadying assist  Assistive Device Other (Comment) (HHA)  Distance Ambulated (ft) 200 ft  Activity Response Tolerated well  Mobility Referral Yes  Mobility visit 1 Mobility  Mobility Specialist Start Time (ACUTE ONLY) 1145  Mobility Specialist Stop Time (ACUTE ONLY) 1157  Mobility Specialist Time Calculation (min) (ACUTE ONLY) 12 min   Pt received in chair, agreeable to mobility. No physical assistance required, HHA for safety. No c/o throughout. Returned to room w/o fault. Left in chair w/ call bell and personal belongings in reach. All needs met. Daughter in room.  Inetta Manes Mobility Specialist  Please contact vis Secure Chat or  Rehab Office [phone]

## 2023-07-31 NOTE — Care Management Obs Status (Signed)
 MEDICARE OBSERVATION STATUS NOTIFICATION   Patient Details  Name: Kristi Montgomery MRN: [id number] Date of Birth: [birth year]   Medicare Observation Status Notification Given:  Yes    Terre Ferri, RN 07/31/2023, 10:16 AM

## 2023-07-31 NOTE — Plan of Care (Signed)

## 2023-07-31 NOTE — Discharge Summary (Signed)
 DISCHARGE SUMMARY  Kristi Montgomery  MR#: [id number]  DOB:1930/04/15  Date of Admission: 07/29/2023 Date of Discharge: 07/31/2023  Attending Physician:Cabell Lazenby Constantine Delude, MD  Patient's NWG:NFAOZ, Autry Legions, MD  Disposition: D/C home   Follow-up Appts:  Follow-up Information     [facility] Follow up.   Specialty: Home Health Services Contact information: [address] [place] [address] [place] [address] [phone]         Margarete Sharps, MD Follow up in 7 day(s).   Specialty: Internal Medicine Contact information: [address] [place] [address] [phone]                 Tests Needing Follow-up: -BMET is suggested in 5-7 days -Follow-up blood pressure with discontinuation of Aldactone  and amlodipine   Discharge Diagnoses: Acute hyponatremia Abdominal pain and diarrhea Chronic diastolic congestive heart failure Paroxysmal atrial fibrillation HTN Mild dementia  Initial presentation: 89 year old with a history of PAF on Eliquis , chronic diastolic CHF, HTN, and mild dementia who presented to the ER 07/29/2023 with complaints of abdominal pain and approximately 2 weeks of loose stools which began after she completed the course of antibiotics for a reported UTI. The abdominal pain was reported as crampy in nature and focused primarily on the right side of the abdomen. She denied fevers or chills. She admitted to poor intake. She continued to take her usual home medications. In the ER she was found to have a sodium of 123 and a lipase of 36. UA was not consistent with UTI. C. difficile screen was negative. CT abdomen/pelvis was negative for any acute inflammatory process.   Hospital Course:  Acute hyponatremia Sodium 123 at presentation -due to hypovolemia in setting of GI loss as well as ongoing use of aldactone  -improved with simple volume resuscitation - stopped aldactone  - encouraged ongoing oral intake - will need recheck of BMET in 5-7  days    Abdominal pain and diarrhea Likely side effect of recent broad-spectrum antibiotic  -no evidence of C. difficile on testing - cont probiotic and as needed Imodium    Chronic diastolic congestive heart failure EF 60-65% by TTE 2023 with grade 2 DD - hypovolemic at presentation - approaching euvolemia at time of d/c - aldactone  stopped due to hyponatremia - continue Toprol    Paroxysmal atrial fibrillation Continue Toprol  and Eliquis  - stable on telemetry   HTN Continue Toprol  with blood pressure well-controlled -amlodipine  discontinued with borderline low/marginal blood pressures in setting of volume depletion -follow-up in outpatient setting but given advanced age would also avoid overaggressive BP control   Mild dementia Continue donepezil  Allergies as of 07/31/2023       Reactions   Amoxicillin Swelling, Other (See Comments)   Tongue swelling  Has patient had a PCN reaction causing immediate rash, facial/tongue/throat swelling, SOB or lightheadedness with hypotension: Yes Has patient had a PCN reaction causing severe rash involving mucus membranes or skin necrosis: Unknown Has patient had a PCN reaction that required hospitalization: Unknown Has patient had a PCN reaction occurring within the last 10 years: Unknown If all of the above answers are "NO", then may proceed with Cephalosporin use.   Amoxicillin-pot Clavulanate Swelling, Other (See Comments)   Swollen and red tongue   Codeine Nausea And Vomiting   Latex Rash, Other (See Comments)   NO BAND-AIDS!!!!   Tape Rash, Other (See Comments)   NO BAND-AIDS OR TAPE!!!!        Medication List     STOP  taking these medications    amLODipine  10 MG tablet Commonly known as: NORVASC    spironolactone  25 MG tablet Commonly known as: ALDACTONE        TAKE these medications    acetaminophen  325 MG tablet Commonly known as: TYLENOL  Take 2 tablets (650 mg total) by mouth every 6 (six) hours as needed for mild pain  (pain score 1-3) or fever (or Fever >/= 101).   donepezil 5 MG tablet Commonly known as: ARICEPT Take 1 tablet by mouth 2 (two) times daily.   Eliquis  2.5 MG Tabs tablet Generic drug: apixaban  Take 1 tablet (2.5 mg total) by mouth 2 (two) times daily.   escitalopram 5 MG tablet Commonly known as: LEXAPRO Take 5 mg by mouth at bedtime.   ketorolac 10 MG tablet Commonly known as: TORADOL Take 1 tablet (10 mg total) by mouth every 8 (eight) hours as needed for severe pain (pain score 7-10).   loperamide  2 MG capsule Commonly known as: IMODIUM  Take 1 capsule (2 mg total) by mouth as needed for diarrhea or loose stools.   metoprolol  succinate 25 MG 24 hr tablet Commonly known as: TOPROL -XL Take 1 tablet by mouth every evening. Take with or immediately following a meal. What changed: See the new instructions.   multivitamin with minerals Tabs tablet Take 1 tablet by mouth in the morning.   saccharomyces boulardii 250 MG capsule Commonly known as: FLORASTOR Take 1 capsule (250 mg total) by mouth 2 (two) times daily for 7 days.   SYSTANE OP Place 1 drop into both eyes daily as needed (dry eyes).   TUMS GAS RELIEF CHEWY BITES PO Take 1 each by mouth as needed.   Vitamin D 50 MCG (2000 UT) Caps Take 1 capsule by mouth in the morning.   zolpidem 10 MG tablet Commonly known as: AMBIEN Take 5 mg by mouth at bedtime.        Day of Discharge BP 116/64 (BP Location: Right Arm)   Pulse (!) 56   Temp 98.2 F (36.8 C) (Oral)   Resp 16   Ht 5\' 3"  (1.6 m)   Wt 54 kg   SpO2 99%   BMI 21.09 kg/m   Physical Exam: General: No acute respiratory distress Lungs: Clear to auscultation bilaterally without wheezes or crackles Cardiovascular: Regular rate and rhythm without murmur gallop or rub normal S1 and S2 Abdomen: Nontender, nondistended, soft, bowel sounds positive, no rebound, no ascites, no appreciable mass Extremities: No significant cyanosis, clubbing, or edema  bilateral lower extremities  Basic Metabolic Panel: Recent Labs  Lab 07/29/23 1223 07/29/23 1621 07/30/23 0644 07/31/23 0640  NA 123*  --  131* 128*  K 3.8  --  4.0 4.0  CL 91*  --  101 96*  CO2 23  --  24 25  GLUCOSE 144*  --  98 111*  BUN 8  --  8 9  CREATININE 0.75  --  0.62 0.74  CALCIUM 9.0  --  8.5* 8.8*  MG  --  1.7 1.8  --     CBC: Recent Labs  Lab 07/29/23 1223 07/30/23 0644  WBC 5.7 4.2  NEUTROABS 4.0  --   HGB 11.0* 9.3*  HCT 30.3* 26.1*  MCV 101.0* 103.6*  PLT 248 200    Time spent in discharge (includes decision making & examination of pt): 35 minutes  07/31/2023, 1:24 PM   Abbe Abate, MD [facility] Office  [phone]

## 2023-07-31 NOTE — TOC Initial Note (Addendum)
 Transition of Care (TOC) - Initial/Assessment Note   Spoke to patient and daughter Kristi Montgomery at bedside.   Patient from home with husband and Kristi Montgomery . Kristi Montgomery works. Patient has Rolator, cane and shower chair at home already.   Discussed PT recommendations for home health PT. Both in agreement.   Kristi Montgomery with [facility] accepted referral for HHPT and HHOT   Kristi Montgomery is the contact person for home health   Will need orders and face to face . Messaged MD  Patient Details  Name: Kristi Montgomery MRN: [id number] Date of Birth: [birth year]  Transition of Care [facility]) CM/SW Contact:    Kristi Ferri, RN Phone Number: 07/31/2023, 10:21 AM  Clinical Narrative:                   Expected Discharge Plan: Home w Home Health Services Barriers to Discharge: Continued Medical Work up   Patient Goals and CMS Choice Patient states their goals for this hospitalization and ongoing recovery are:: to return to home CMS Medicare.gov Compare Post Acute Care list provided to:: Patient Represenative (must comment) Kristi Montgomery) Choice offered to / list presented to : Adult Children      Expected Discharge Plan and Services   Discharge Planning Services: CM Consult Post Acute Care Choice: Home Health Living arrangements for the past 2 months: Single Family Home                 DME Arranged: N/A DME Agency: NA       HH Arranged: PT          Prior Living Arrangements/Services Living arrangements for the past 2 months: Single Family Home Lives with:: Spouse, Adult Children Patient language and need for interpreter reviewed:: Yes Do you feel safe going back to the place where you live?: Yes      Need for Family Participation in Patient Care: Yes (Comment) Care giver support system in place?: Yes (comment) Current home services: DME Criminal Activity/Legal Involvement Pertinent to Current Situation/Hospitalization: No - Comment as needed  Activities of Daily Living   ADL Screening  (condition at time of admission) Independently performs ADLs?: Yes (appropriate for developmental age) Is the patient deaf or have difficulty hearing?: No Does the patient have difficulty seeing, even when wearing glasses/contacts?: No Does the patient have difficulty concentrating, remembering, or making decisions?: No  Permission Sought/Granted   Permission granted to share information with : Yes, Verbal Permission Granted  Share Information with NAME: daughter Kristi Montgomery  Permission granted to share info w AGENCY: home health agencies        Emotional Assessment Appearance:: Appears stated age Attitude/Demeanor/Rapport: Engaged Affect (typically observed): Appropriate Orientation: : Oriented to Self, Oriented to Place Alcohol  / Substance Use: Not Applicable Psych Involvement: No (comment)  Admission diagnosis:  Hyponatremia [E87.1] Generalized abdominal pain [R10.84] Diarrhea, unspecified type [R19.7] Patient Active Problem List   Diagnosis Date Noted   Chronic heart failure with preserved ejection fraction (HFpEF, >= 50%) (HCC) 07/29/2023   Mild dementia (HCC) 07/29/2023   Hyponatremia 07/29/2023   Acute on chronic diastolic congestive heart failure (HCC) 07/08/2023   Atherosclerosis of left carotid artery 07/08/2023   Long term current use of anticoagulant therapy 07/08/2023   Pulmonary hypertension (HCC) 07/08/2023   Respiratory failure with hypoxia (HCC) 07/08/2023   Acquired absence of other specified parts of digestive tract 01/21/2023   Acquired thrombophilia (HCC) 01/21/2023   Chronic diarrhea 01/21/2023   Gastroesophageal reflux disease 01/21/2023   Hypertensive heart  disease with heart failure (HCC) 01/21/2023   Iron deficiency anemia 01/21/2023   Pleural effusion 01/21/2023   Chronic fatigue 01/21/2023   Pulmonary edema 12/05/2022   Lumbar spondylosis 03/14/2022   Lumbar radiculopathy 03/14/2022   Abnormal auditory perception of both ears 11/19/2021    Paroxysmal atrial fibrillation (HCC) 01/13/2021   Atrial fibrillation with rapid ventricular response (HCC) 10/23/2020   Persistent atrial fibrillation (HCC) 10/22/2020   Anemia 05/21/2018   Constipation 05/21/2018   Hardening of the aorta (main artery of the heart) (HCC) 05/21/2018   Hypo-osmolality and hyponatremia 01/15/2018   Bilateral temporomandibular joint pain 09/24/2017   Presbycusis of both ears 09/24/2017   Temporal arteritis (HCC) 09/24/2017   Blepharospasm 09/15/2017   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/06/2017   Visual disturbance 03/12/2015   Bilateral dry eyes 07/13/2014   Chronic kidney disease, stage 2 (mild) 02/02/2014   Disequilibrium 08/02/2013   Muscle weakness 08/02/2013   Pain in joint involving pelvic region and thigh 08/02/2013   Essential hypertension 07/29/2012   Choroidal nevus 07/10/2011   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   Nuclear cataract 07/10/2011   Allergic rhinitis 01/15/2011   Macular degeneration 01/15/2011   Dermatitis 06/26/2009   CHEST PAIN UNSPECIFIED 01/25/2009   DYSPHAGIA UNSPECIFIED 01/25/2009   Polyneuropathy 12/27/2008   Diaphragmatic hernia 12/19/2008   Hyperlipidemia 12/19/2008   Insomnia 12/19/2008   Major depression, single episode 12/19/2008   Malignant melanoma of skin (HCC) 12/19/2008   Osteoarthritis 12/19/2008   PCP:  Margarete Sharps, MD Pharmacy:   [facility] - [place], [place] - [address] [place] [address] Phone: [phone] Fax: [phone]  [facility] [address] [place] [address] Phone: [phone] Fax: [phone]  [id number] - [place], [place] - [address] [address] Phone: [phone] Fax: [phone]  [facility] [address] [place] [address] Phone: [phone] Fax: [phone]     Social Drivers of Health (SDOH) Social History: SDOH Screenings   Food Insecurity: No Food Insecurity (07/29/2023)  Housing: Low Risk  (07/29/2023)  Transportation Needs: No Transportation Needs (07/29/2023)  Utilities: Not At Risk (07/29/2023)  Social Connections: Unknown (07/29/2023)  Tobacco Use: Medium Risk (07/29/2023)   SDOH Interventions:     Readmission Risk Interventions     No data to display

## 2023-08-03 ENCOUNTER — Ambulatory Visit: Attending: Cardiovascular Disease | Admitting: Emergency Medicine

## 2023-08-03 DIAGNOSIS — Z01810 Encounter for preprocedural cardiovascular examination: Secondary | ICD-10-CM

## 2023-08-03 NOTE — Progress Notes (Addendum)
 Virtual Visit via Telephone Note   Because of Kristi Montgomery co-morbid illnesses, she is at least at moderate risk for complications without adequate follow up.  This format is felt to be most appropriate for this patient at this time.  Due to technical limitations with video connection (technology), today's appointment will be conducted as an audio only telehealth visit, and Kristi Montgomery verbally agreed to proceed in this manner.   All issues noted in this document were discussed and addressed.  No physical exam could be performed with this format.  Evaluation Performed:  Preoperative cardiovascular risk assessment _____________   Date:  08/03/2023   Patient ID:  Kristi Montgomery, DOB [birth year], MRN [id number] Patient Location:  Home Provider location:   Office  Primary Care Provider:  Margarete Sharps, MD Primary Cardiologist:  None  Chief Complaint / Patient Profile   89 y.o. y/o female with a h/o hypertension, chronic iron deficiency anemia, anxiety, GERD, former smoker, paroxysmal atrial fibrillation, HFpEF who is pending bilateral upper eyelid blepharoplasty repair on 08/25/2023 with [facility] and presents today for telephonic preoperative cardiovascular risk assessment.  History of Present Illness    Kristi Montgomery is a 89 y.o. female who presents via audio/video conferencing for a telehealth visit today.  Pt was last seen in cardiology clinic on 10/31/2018 for by Dr. Albert Huff.  At that time Kristi Montgomery was doing well.  The patient is now pending procedure as outlined above. Since her last visit, she denies chest pain, shortness of breath, lower extremity edema, fatigue, palpitations, melena, hematuria, hemoptysis, diaphoresis, weakness, presyncope, syncope, orthopnea, and PND.  Today patient is doing well overall.  She is without any acute cardiovascular concerns or complaints.  Notes she was recently admitted to the hospital for abdominal pain and diarrhea.  She notes her  abdominal pains have improved.  She was also hypovolemic, she has been increasing her hydration status and feels much improved.  She denies any chest pains or exertional symptoms.  No angina, no indication for further ischemic evaluation at this time.  She denies symptoms concerning for recurrent atrial fibrillation.  She notes her weight is stable that she feels euvolemic.  Overall she does stay relatively active however her activity level has been compromised by back pain over the last several months.  She still walks daily, can climb a flight of stairs with ease, walk a block without limitation and perform yard work.  Overall she is able to complete greater than 4 METS and is asymptomatic today.  She was given our new address and phone number.  She is to call the office to make her yearly follow-up visit.  Past Medical History    Past Medical History:  Diagnosis Date   Anxiety    Cancer (HCC)    Melanoma   Cataract    Chest pain    myoview 04/01/12-normal   History of tobacco abuse    HTN (hypertension)    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/06/2017   Past Surgical History:  Procedure Laterality Date   BREAST BIOPSY Left 2012   BREAST BIOPSY Right    CARDIOVERSION N/A 12/04/2020   Procedure: CARDIOVERSION;  Surgeon: Knox Perl, MD;  [facility] ENDOSCOPY;  Service: Cardiovascular;  Laterality: N/A;   CHOLECYSTECTOMY     MELANOMA EXCISION     x5    Allergies  Allergies  Allergen Reactions   Amoxicillin Swelling and Other (See Comments)    Tongue swelling  Has patient had a PCN  reaction causing immediate rash, facial/tongue/throat swelling, SOB or lightheadedness with hypotension: Yes Has patient had a PCN reaction causing severe rash involving mucus membranes or skin necrosis: Unknown Has patient had a PCN reaction that required hospitalization: Unknown Has patient had a PCN reaction occurring within the last 10 years: Unknown If all of the above answers are "NO", then may proceed with  Cephalosporin use.    Amoxicillin-Pot Clavulanate Swelling and Other (See Comments)    Swollen and red tongue   Codeine Nausea And Vomiting   Latex Rash and Other (See Comments)    NO BAND-AIDS!!!!   Tape Rash and Other (See Comments)    NO BAND-AIDS OR TAPE!!!!    Home Medications    Prior to Admission medications   Medication Sig Start Date End Date Taking? Authorizing Provider  acetaminophen  (TYLENOL ) 325 MG tablet Take 2 tablets (650 mg total) by mouth every 6 (six) hours as needed for mild pain (pain score 1-3) or fever (or Fever >/= 101). 07/31/23   Abbe Abate, MD  apixaban  (ELIQUIS ) 2.5 MG TABS tablet Take 1 tablet (2.5 mg total) by mouth 2 (two) times daily. 10/25/20   Deforest Fast, MD  Calcium Carbonate-Simethicone (TUMS GAS RELIEF CHEWY BITES PO) Take 1 each by mouth as needed.    [provider]  Cholecalciferol (VITAMIN D) 50 MCG (2000 UT) CAPS Take 1 capsule by mouth in the morning.    [provider]  donepezil  (ARICEPT ) 5 MG tablet Take 1 tablet by mouth 2 (two) times daily.    [provider]  escitalopram  (LEXAPRO ) 5 MG tablet Take 5 mg by mouth at bedtime.    [provider]  ketorolac  (TORADOL ) 10 MG tablet Take 1 tablet (10 mg total) by mouth every 8 (eight) hours as needed for severe pain (pain score 7-10). 07/31/23   Abbe Abate, MD  loperamide  (IMODIUM ) 2 MG capsule Take 1 capsule (2 mg total) by mouth as needed for diarrhea or loose stools. 07/31/23   Abbe Abate, MD  metoprolol  succinate (TOPROL -XL) 25 MG 24 hr tablet Take 1 tablet by mouth every evening. Take with or immediately following a meal. Patient taking differently: Take 25 mg by mouth daily with supper. 11/19/22   Knox Perl, MD  Multiple Vitamin (MULTIVITAMIN WITH MINERALS) TABS tablet Take 1 tablet by mouth in the morning.    [provider]  Polyethyl Glycol-Propyl Glycol (SYSTANE OP) Place 1 drop into both eyes daily as needed (dry eyes).     [provider]  saccharomyces boulardii (FLORASTOR) 250 MG capsule Take 1 capsule (250 mg total) by mouth 2 (two) times daily for 7 days. 07/31/23 08/07/23  Abbe Abate, MD  zolpidem  (AMBIEN ) 10 MG tablet Take 5 mg by mouth at bedtime. 09/18/20   [provider]    Physical Exam    Vital Signs:  Kristi Montgomery does not have vital signs available for review today.  Given telephonic nature of communication, physical exam is limited. AAOx3. NAD. Normal affect.  Speech and respirations are unlabored.  Accessory Clinical Findings    None  Assessment & Plan    1.  Preoperative Cardiovascular Risk Assessment: According to the Revised Cardiac Risk Index (RCRI), her Perioperative Risk of Major Cardiac Event is (%): 0.9. Her Functional Capacity in METs is: 5.62 according to the Duke Activity Status Index (DASI). Therefore, based on ACC/AHA guidelines, patient would be at acceptable risk for the planned procedure without further cardiovascular testing.  The patient was advised that if she develops new symptoms prior to surgery to contact our office to arrange for a follow-up visit, and she verbalized understanding.  Per office protocol, patient can hold Eliquis  for 1 day prior to procedure. Please resume Eliquis  as soon as possible postprocedure, at the discretion of the surgeon.   A copy of this note will be routed to requesting surgeon.  Time:   Today, I have spent 9 minutes with the patient with telehealth technology discussing medical history, symptoms, and management plan.     Ava Boatman, NP  08/03/2023, 2:14 PM

## 2023-08-04 DIAGNOSIS — E871 Hypo-osmolality and hyponatremia: Secondary | ICD-10-CM | POA: Diagnosis not present

## 2023-08-04 DIAGNOSIS — Z9181 History of falling: Secondary | ICD-10-CM | POA: Diagnosis not present

## 2023-08-04 DIAGNOSIS — H269 Unspecified cataract: Secondary | ICD-10-CM | POA: Diagnosis not present

## 2023-08-04 DIAGNOSIS — I110 Hypertensive heart disease with heart failure: Secondary | ICD-10-CM | POA: Diagnosis not present

## 2023-08-04 DIAGNOSIS — Z556 Problems related to health literacy: Secondary | ICD-10-CM | POA: Diagnosis not present

## 2023-08-04 DIAGNOSIS — Z9049 Acquired absence of other specified parts of digestive tract: Secondary | ICD-10-CM | POA: Diagnosis not present

## 2023-08-04 DIAGNOSIS — Z7901 Long term (current) use of anticoagulants: Secondary | ICD-10-CM | POA: Diagnosis not present

## 2023-08-04 DIAGNOSIS — F03A4 Unspecified dementia, mild, with anxiety: Secondary | ICD-10-CM | POA: Diagnosis not present

## 2023-08-04 DIAGNOSIS — H538 Other visual disturbances: Secondary | ICD-10-CM | POA: Diagnosis not present

## 2023-08-04 DIAGNOSIS — Z87891 Personal history of nicotine dependence: Secondary | ICD-10-CM | POA: Diagnosis not present

## 2023-08-04 DIAGNOSIS — Z85820 Personal history of malignant melanoma of skin: Secondary | ICD-10-CM | POA: Diagnosis not present

## 2023-08-04 DIAGNOSIS — R109 Unspecified abdominal pain: Secondary | ICD-10-CM | POA: Diagnosis not present

## 2023-08-04 DIAGNOSIS — M9905 Segmental and somatic dysfunction of pelvic region: Secondary | ICD-10-CM | POA: Diagnosis not present

## 2023-08-04 DIAGNOSIS — Z87440 Personal history of urinary (tract) infections: Secondary | ICD-10-CM | POA: Diagnosis not present

## 2023-08-04 DIAGNOSIS — M51361 Other intervertebral disc degeneration, lumbar region with lower extremity pain only: Secondary | ICD-10-CM | POA: Diagnosis not present

## 2023-08-04 DIAGNOSIS — M9904 Segmental and somatic dysfunction of sacral region: Secondary | ICD-10-CM | POA: Diagnosis not present

## 2023-08-04 DIAGNOSIS — I5032 Chronic diastolic (congestive) heart failure: Secondary | ICD-10-CM | POA: Diagnosis not present

## 2023-08-04 DIAGNOSIS — M9903 Segmental and somatic dysfunction of lumbar region: Secondary | ICD-10-CM | POA: Diagnosis not present

## 2023-08-04 DIAGNOSIS — I480 Paroxysmal atrial fibrillation: Secondary | ICD-10-CM | POA: Diagnosis not present

## 2023-08-10 DIAGNOSIS — H02412 Mechanical ptosis of left eyelid: Secondary | ICD-10-CM | POA: Diagnosis not present

## 2023-08-10 DIAGNOSIS — H02423 Myogenic ptosis of bilateral eyelids: Secondary | ICD-10-CM | POA: Diagnosis not present

## 2023-08-10 DIAGNOSIS — H02422 Myogenic ptosis of left eyelid: Secondary | ICD-10-CM | POA: Diagnosis not present

## 2023-08-10 DIAGNOSIS — G244 Idiopathic orofacial dystonia: Secondary | ICD-10-CM | POA: Diagnosis not present

## 2023-08-10 DIAGNOSIS — H57813 Brow ptosis, bilateral: Secondary | ICD-10-CM | POA: Diagnosis not present

## 2023-08-10 DIAGNOSIS — G245 Blepharospasm: Secondary | ICD-10-CM | POA: Diagnosis not present

## 2023-08-10 DIAGNOSIS — H0279 Other degenerative disorders of eyelid and periocular area: Secondary | ICD-10-CM | POA: Diagnosis not present

## 2023-08-10 DIAGNOSIS — H02831 Dermatochalasis of right upper eyelid: Secondary | ICD-10-CM | POA: Diagnosis not present

## 2023-08-10 DIAGNOSIS — H02834 Dermatochalasis of left upper eyelid: Secondary | ICD-10-CM | POA: Diagnosis not present

## 2023-08-10 DIAGNOSIS — H02411 Mechanical ptosis of right eyelid: Secondary | ICD-10-CM | POA: Diagnosis not present

## 2023-08-10 DIAGNOSIS — H02421 Myogenic ptosis of right eyelid: Secondary | ICD-10-CM | POA: Diagnosis not present

## 2023-08-10 DIAGNOSIS — H02413 Mechanical ptosis of bilateral eyelids: Secondary | ICD-10-CM | POA: Diagnosis not present

## 2023-08-11 DIAGNOSIS — Z9049 Acquired absence of other specified parts of digestive tract: Secondary | ICD-10-CM | POA: Diagnosis not present

## 2023-08-11 DIAGNOSIS — E871 Hypo-osmolality and hyponatremia: Secondary | ICD-10-CM | POA: Diagnosis not present

## 2023-08-11 DIAGNOSIS — Z559 Problems related to education and literacy, unspecified: Secondary | ICD-10-CM | POA: Diagnosis not present

## 2023-08-11 DIAGNOSIS — I480 Paroxysmal atrial fibrillation: Secondary | ICD-10-CM | POA: Diagnosis not present

## 2023-08-11 DIAGNOSIS — H538 Other visual disturbances: Secondary | ICD-10-CM | POA: Diagnosis not present

## 2023-08-11 DIAGNOSIS — Z7901 Long term (current) use of anticoagulants: Secondary | ICD-10-CM | POA: Diagnosis not present

## 2023-08-11 DIAGNOSIS — Z85820 Personal history of malignant melanoma of skin: Secondary | ICD-10-CM | POA: Diagnosis not present

## 2023-08-11 DIAGNOSIS — F03A4 Unspecified dementia, mild, with anxiety: Secondary | ICD-10-CM | POA: Diagnosis not present

## 2023-08-11 DIAGNOSIS — I5032 Chronic diastolic (congestive) heart failure: Secondary | ICD-10-CM | POA: Diagnosis not present

## 2023-08-11 DIAGNOSIS — I110 Hypertensive heart disease with heart failure: Secondary | ICD-10-CM | POA: Diagnosis not present

## 2023-08-11 DIAGNOSIS — R109 Unspecified abdominal pain: Secondary | ICD-10-CM | POA: Diagnosis not present

## 2023-08-11 DIAGNOSIS — H269 Unspecified cataract: Secondary | ICD-10-CM | POA: Diagnosis not present

## 2023-09-11 DIAGNOSIS — M5442 Lumbago with sciatica, left side: Secondary | ICD-10-CM | POA: Diagnosis not present

## 2023-09-15 DIAGNOSIS — M5459 Other low back pain: Secondary | ICD-10-CM | POA: Diagnosis not present

## 2023-09-15 DIAGNOSIS — M791 Myalgia, unspecified site: Secondary | ICD-10-CM | POA: Diagnosis not present

## 2023-09-16 DIAGNOSIS — I130 Hypertensive heart and chronic kidney disease with heart failure and stage 1 through stage 4 chronic kidney disease, or unspecified chronic kidney disease: Secondary | ICD-10-CM | POA: Diagnosis not present

## 2023-09-16 DIAGNOSIS — E785 Hyperlipidemia, unspecified: Secondary | ICD-10-CM | POA: Diagnosis not present

## 2023-09-16 DIAGNOSIS — R7301 Impaired fasting glucose: Secondary | ICD-10-CM | POA: Diagnosis not present

## 2023-09-16 DIAGNOSIS — D649 Anemia, unspecified: Secondary | ICD-10-CM | POA: Diagnosis not present

## 2023-09-16 DIAGNOSIS — N182 Chronic kidney disease, stage 2 (mild): Secondary | ICD-10-CM | POA: Diagnosis not present

## 2023-09-16 DIAGNOSIS — I5032 Chronic diastolic (congestive) heart failure: Secondary | ICD-10-CM | POA: Diagnosis not present

## 2023-09-16 DIAGNOSIS — M8580 Other specified disorders of bone density and structure, unspecified site: Secondary | ICD-10-CM | POA: Diagnosis not present

## 2023-09-22 DIAGNOSIS — G245 Blepharospasm: Secondary | ICD-10-CM | POA: Diagnosis not present

## 2023-09-22 DIAGNOSIS — Z1339 Encounter for screening examination for other mental health and behavioral disorders: Secondary | ICD-10-CM | POA: Diagnosis not present

## 2023-09-22 DIAGNOSIS — I5032 Chronic diastolic (congestive) heart failure: Secondary | ICD-10-CM | POA: Diagnosis not present

## 2023-09-22 DIAGNOSIS — Z0000 Encounter for general adult medical examination without abnormal findings: Secondary | ICD-10-CM | POA: Diagnosis not present

## 2023-09-22 DIAGNOSIS — F419 Anxiety disorder, unspecified: Secondary | ICD-10-CM | POA: Diagnosis not present

## 2023-09-22 DIAGNOSIS — Z7901 Long term (current) use of anticoagulants: Secondary | ICD-10-CM | POA: Diagnosis not present

## 2023-09-22 DIAGNOSIS — R627 Adult failure to thrive: Secondary | ICD-10-CM | POA: Diagnosis not present

## 2023-09-22 DIAGNOSIS — G629 Polyneuropathy, unspecified: Secondary | ICD-10-CM | POA: Diagnosis not present

## 2023-09-22 DIAGNOSIS — I272 Pulmonary hypertension, unspecified: Secondary | ICD-10-CM | POA: Diagnosis not present

## 2023-09-22 DIAGNOSIS — F03A4 Unspecified dementia, mild, with anxiety: Secondary | ICD-10-CM | POA: Diagnosis not present

## 2023-09-22 DIAGNOSIS — I480 Paroxysmal atrial fibrillation: Secondary | ICD-10-CM | POA: Diagnosis not present

## 2023-09-22 DIAGNOSIS — I130 Hypertensive heart and chronic kidney disease with heart failure and stage 1 through stage 4 chronic kidney disease, or unspecified chronic kidney disease: Secondary | ICD-10-CM | POA: Diagnosis not present

## 2023-09-22 DIAGNOSIS — N182 Chronic kidney disease, stage 2 (mild): Secondary | ICD-10-CM | POA: Diagnosis not present

## 2023-09-22 DIAGNOSIS — M545 Low back pain, unspecified: Secondary | ICD-10-CM | POA: Diagnosis not present

## 2023-09-22 DIAGNOSIS — I129 Hypertensive chronic kidney disease with stage 1 through stage 4 chronic kidney disease, or unspecified chronic kidney disease: Secondary | ICD-10-CM | POA: Diagnosis not present

## 2023-09-22 DIAGNOSIS — R82998 Other abnormal findings in urine: Secondary | ICD-10-CM | POA: Diagnosis not present

## 2023-09-22 DIAGNOSIS — C439 Malignant melanoma of skin, unspecified: Secondary | ICD-10-CM | POA: Diagnosis not present

## 2023-09-22 DIAGNOSIS — Z1331 Encounter for screening for depression: Secondary | ICD-10-CM | POA: Diagnosis not present

## 2023-09-22 DIAGNOSIS — D6869 Other thrombophilia: Secondary | ICD-10-CM | POA: Diagnosis not present

## 2023-09-22 DIAGNOSIS — I110 Hypertensive heart disease with heart failure: Secondary | ICD-10-CM | POA: Diagnosis not present

## 2023-10-06 DIAGNOSIS — I5032 Chronic diastolic (congestive) heart failure: Secondary | ICD-10-CM | POA: Diagnosis not present

## 2023-10-06 DIAGNOSIS — I480 Paroxysmal atrial fibrillation: Secondary | ICD-10-CM | POA: Diagnosis not present

## 2023-10-06 DIAGNOSIS — Z87891 Personal history of nicotine dependence: Secondary | ICD-10-CM | POA: Diagnosis not present

## 2023-10-06 DIAGNOSIS — H538 Other visual disturbances: Secondary | ICD-10-CM | POA: Diagnosis not present

## 2023-10-06 DIAGNOSIS — F03A4 Unspecified dementia, mild, with anxiety: Secondary | ICD-10-CM | POA: Diagnosis not present

## 2023-10-06 DIAGNOSIS — Z85820 Personal history of malignant melanoma of skin: Secondary | ICD-10-CM | POA: Diagnosis not present

## 2023-10-06 DIAGNOSIS — Z9049 Acquired absence of other specified parts of digestive tract: Secondary | ICD-10-CM | POA: Diagnosis not present

## 2023-10-06 DIAGNOSIS — Z7901 Long term (current) use of anticoagulants: Secondary | ICD-10-CM | POA: Diagnosis not present

## 2023-10-06 DIAGNOSIS — Z556 Problems related to health literacy: Secondary | ICD-10-CM | POA: Diagnosis not present

## 2023-10-06 DIAGNOSIS — H269 Unspecified cataract: Secondary | ICD-10-CM | POA: Diagnosis not present

## 2023-10-06 DIAGNOSIS — Z87440 Personal history of urinary (tract) infections: Secondary | ICD-10-CM | POA: Diagnosis not present

## 2023-10-06 DIAGNOSIS — I110 Hypertensive heart disease with heart failure: Secondary | ICD-10-CM | POA: Diagnosis not present

## 2023-10-21 DIAGNOSIS — G5132 Clonic hemifacial spasm, left: Secondary | ICD-10-CM | POA: Diagnosis not present

## 2023-10-21 DIAGNOSIS — H0279 Other degenerative disorders of eyelid and periocular area: Secondary | ICD-10-CM | POA: Diagnosis not present

## 2023-10-21 DIAGNOSIS — H02423 Myogenic ptosis of bilateral eyelids: Secondary | ICD-10-CM | POA: Diagnosis not present

## 2023-10-21 DIAGNOSIS — G5131 Clonic hemifacial spasm, right: Secondary | ICD-10-CM | POA: Diagnosis not present

## 2023-10-21 DIAGNOSIS — G245 Blepharospasm: Secondary | ICD-10-CM | POA: Diagnosis not present

## 2023-10-21 DIAGNOSIS — G244 Idiopathic orofacial dystonia: Secondary | ICD-10-CM | POA: Diagnosis not present

## 2023-10-21 DIAGNOSIS — G5133 Clonic hemifacial spasm, bilateral: Secondary | ICD-10-CM | POA: Diagnosis not present

## 2023-11-10 DIAGNOSIS — M9903 Segmental and somatic dysfunction of lumbar region: Secondary | ICD-10-CM | POA: Diagnosis not present

## 2023-11-10 DIAGNOSIS — M9905 Segmental and somatic dysfunction of pelvic region: Secondary | ICD-10-CM | POA: Diagnosis not present

## 2023-11-10 DIAGNOSIS — M9904 Segmental and somatic dysfunction of sacral region: Secondary | ICD-10-CM | POA: Diagnosis not present

## 2023-11-10 DIAGNOSIS — M51361 Other intervertebral disc degeneration, lumbar region with lower extremity pain only: Secondary | ICD-10-CM | POA: Diagnosis not present

## 2023-11-11 DIAGNOSIS — H5203 Hypermetropia, bilateral: Secondary | ICD-10-CM | POA: Diagnosis not present

## 2023-11-11 DIAGNOSIS — M9904 Segmental and somatic dysfunction of sacral region: Secondary | ICD-10-CM | POA: Diagnosis not present

## 2023-11-11 DIAGNOSIS — H353131 Nonexudative age-related macular degeneration, bilateral, early dry stage: Secondary | ICD-10-CM | POA: Diagnosis not present

## 2023-11-11 DIAGNOSIS — M9903 Segmental and somatic dysfunction of lumbar region: Secondary | ICD-10-CM | POA: Diagnosis not present

## 2023-11-11 DIAGNOSIS — M9905 Segmental and somatic dysfunction of pelvic region: Secondary | ICD-10-CM | POA: Diagnosis not present

## 2023-11-11 DIAGNOSIS — M51361 Other intervertebral disc degeneration, lumbar region with lower extremity pain only: Secondary | ICD-10-CM | POA: Diagnosis not present

## 2023-11-11 DIAGNOSIS — Z961 Presence of intraocular lens: Secondary | ICD-10-CM | POA: Diagnosis not present

## 2023-11-12 DIAGNOSIS — M9903 Segmental and somatic dysfunction of lumbar region: Secondary | ICD-10-CM | POA: Diagnosis not present

## 2023-11-12 DIAGNOSIS — M9904 Segmental and somatic dysfunction of sacral region: Secondary | ICD-10-CM | POA: Diagnosis not present

## 2023-11-12 DIAGNOSIS — M51361 Other intervertebral disc degeneration, lumbar region with lower extremity pain only: Secondary | ICD-10-CM | POA: Diagnosis not present

## 2023-11-12 DIAGNOSIS — M9905 Segmental and somatic dysfunction of pelvic region: Secondary | ICD-10-CM | POA: Diagnosis not present

## 2023-11-23 DIAGNOSIS — R269 Unspecified abnormalities of gait and mobility: Secondary | ICD-10-CM | POA: Diagnosis not present

## 2023-11-23 DIAGNOSIS — Z23 Encounter for immunization: Secondary | ICD-10-CM | POA: Diagnosis not present

## 2023-11-23 DIAGNOSIS — M545 Low back pain, unspecified: Secondary | ICD-10-CM | POA: Diagnosis not present

## 2023-12-06 ENCOUNTER — Other Ambulatory Visit: Payer: Self-pay | Admitting: Cardiology

## 2023-12-06 DIAGNOSIS — I480 Paroxysmal atrial fibrillation: Secondary | ICD-10-CM

## 2023-12-24 DIAGNOSIS — D2271 Melanocytic nevi of right lower limb, including hip: Secondary | ICD-10-CM | POA: Diagnosis not present

## 2023-12-24 DIAGNOSIS — L814 Other melanin hyperpigmentation: Secondary | ICD-10-CM | POA: Diagnosis not present

## 2023-12-24 DIAGNOSIS — D2261 Melanocytic nevi of right upper limb, including shoulder: Secondary | ICD-10-CM | POA: Diagnosis not present

## 2023-12-24 DIAGNOSIS — L570 Actinic keratosis: Secondary | ICD-10-CM | POA: Diagnosis not present

## 2023-12-24 DIAGNOSIS — D2262 Melanocytic nevi of left upper limb, including shoulder: Secondary | ICD-10-CM | POA: Diagnosis not present

## 2023-12-24 DIAGNOSIS — Z85828 Personal history of other malignant neoplasm of skin: Secondary | ICD-10-CM | POA: Diagnosis not present

## 2023-12-24 DIAGNOSIS — Z85820 Personal history of malignant melanoma of skin: Secondary | ICD-10-CM | POA: Diagnosis not present

## 2023-12-24 DIAGNOSIS — D0372 Melanoma in situ of left lower limb, including hip: Secondary | ICD-10-CM | POA: Diagnosis not present

## 2023-12-24 DIAGNOSIS — L821 Other seborrheic keratosis: Secondary | ICD-10-CM | POA: Diagnosis not present

## 2023-12-24 DIAGNOSIS — D225 Melanocytic nevi of trunk: Secondary | ICD-10-CM | POA: Diagnosis not present

## 2023-12-24 DIAGNOSIS — D485 Neoplasm of uncertain behavior of skin: Secondary | ICD-10-CM | POA: Diagnosis not present

## 2024-02-10 ENCOUNTER — Other Ambulatory Visit: Payer: Self-pay | Admitting: Cardiology

## 2024-02-10 DIAGNOSIS — I480 Paroxysmal atrial fibrillation: Secondary | ICD-10-CM
